# Patient Record
Sex: Female | Born: 1960 | Race: White | Hispanic: No | Marital: Married | Smoking: Former smoker
Health system: Southern US, Community
[De-identification: ages and names within clinical notes are randomized; demographics above are authoritative.]

## PROBLEM LIST (undated history)

## (undated) DIAGNOSIS — Z5189 Encounter for other specified aftercare: Secondary | ICD-10-CM

## (undated) DIAGNOSIS — F329 Major depressive disorder, single episode, unspecified: Secondary | ICD-10-CM

## (undated) DIAGNOSIS — K644 Residual hemorrhoidal skin tags: Secondary | ICD-10-CM

## (undated) DIAGNOSIS — M1990 Unspecified osteoarthritis, unspecified site: Secondary | ICD-10-CM

## (undated) DIAGNOSIS — Z460 Encounter for fitting and adjustment of spectacles and contact lenses: Secondary | ICD-10-CM

## (undated) DIAGNOSIS — IMO0002 Reserved for concepts with insufficient information to code with codable children: Secondary | ICD-10-CM

## (undated) DIAGNOSIS — R55 Syncope and collapse: Secondary | ICD-10-CM

## (undated) DIAGNOSIS — K219 Gastro-esophageal reflux disease without esophagitis: Secondary | ICD-10-CM

## (undated) DIAGNOSIS — G709 Myoneural disorder, unspecified: Secondary | ICD-10-CM

## (undated) DIAGNOSIS — Z972 Presence of dental prosthetic device (complete) (partial): Secondary | ICD-10-CM

## (undated) DIAGNOSIS — F32A Depression, unspecified: Secondary | ICD-10-CM

## (undated) DIAGNOSIS — M797 Fibromyalgia: Secondary | ICD-10-CM

## (undated) DIAGNOSIS — B977 Papillomavirus as the cause of diseases classified elsewhere: Secondary | ICD-10-CM

## (undated) DIAGNOSIS — IMO0001 Reserved for inherently not codable concepts without codable children: Secondary | ICD-10-CM

## (undated) HISTORY — DX: Gastro-esophageal reflux disease without esophagitis: K21.9

## (undated) HISTORY — PX: COLONOSCOPY: SHX174

## (undated) HISTORY — DX: Myoneural disorder, unspecified: G70.9

## (undated) HISTORY — DX: Reserved for concepts with insufficient information to code with codable children: IMO0002

## (undated) HISTORY — PX: CYSTOSCOPY: SUR368

## (undated) HISTORY — DX: Encounter for other specified aftercare: Z51.89

## (undated) HISTORY — DX: Major depressive disorder, single episode, unspecified: F32.9

## (undated) HISTORY — DX: Reserved for inherently not codable concepts without codable children: IMO0001

## (undated) HISTORY — DX: Depression, unspecified: F32.A

## (undated) HISTORY — DX: Papillomavirus as the cause of diseases classified elsewhere: B97.7

## (undated) HISTORY — PX: POLYPECTOMY: SHX149

## (undated) HISTORY — DX: Syncope and collapse: R55

## (undated) HISTORY — DX: Residual hemorrhoidal skin tags: K64.4

## (undated) HISTORY — PX: DIAGNOSTIC LAPAROSCOPY: SUR761

## (undated) HISTORY — DX: Unspecified osteoarthritis, unspecified site: M19.90

---

## 1978-01-05 HISTORY — PX: OTHER SURGICAL HISTORY: SHX169

## 1981-01-05 HISTORY — PX: VAGINAL HYSTERECTOMY: SUR661

## 1981-01-05 HISTORY — PX: APPENDECTOMY: SHX54

## 1982-01-05 HISTORY — PX: ABDOMINAL EXPLORATION SURGERY: SHX538

## 1993-01-05 HISTORY — PX: CARPAL TUNNEL RELEASE: SHX101

## 1997-09-26 ENCOUNTER — Other Ambulatory Visit: Admission: RE | Admit: 1997-09-26 | Discharge: 1997-09-26 | Payer: Self-pay | Admitting: Family Medicine

## 1998-01-05 HISTORY — PX: BREAST ENHANCEMENT SURGERY: SHX7

## 1998-12-12 ENCOUNTER — Other Ambulatory Visit: Admission: RE | Admit: 1998-12-12 | Discharge: 1998-12-12 | Payer: Self-pay | Admitting: Family Medicine

## 1999-08-16 ENCOUNTER — Emergency Department (HOSPITAL_COMMUNITY): Admission: EM | Admit: 1999-08-16 | Discharge: 1999-08-16 | Payer: Self-pay | Admitting: Emergency Medicine

## 1999-12-24 ENCOUNTER — Other Ambulatory Visit: Admission: RE | Admit: 1999-12-24 | Discharge: 1999-12-24 | Payer: Self-pay | Admitting: Family Medicine

## 2001-01-25 ENCOUNTER — Other Ambulatory Visit: Admission: RE | Admit: 2001-01-25 | Discharge: 2001-01-25 | Payer: Self-pay | Admitting: Family Medicine

## 2002-01-23 ENCOUNTER — Other Ambulatory Visit: Admission: RE | Admit: 2002-01-23 | Discharge: 2002-01-23 | Payer: Self-pay | Admitting: Family Medicine

## 2002-05-26 ENCOUNTER — Encounter: Payer: Self-pay | Admitting: Emergency Medicine

## 2002-05-26 ENCOUNTER — Observation Stay (HOSPITAL_COMMUNITY): Admission: EM | Admit: 2002-05-26 | Discharge: 2002-05-26 | Payer: Self-pay | Admitting: Emergency Medicine

## 2003-06-21 ENCOUNTER — Ambulatory Visit (HOSPITAL_COMMUNITY): Admission: RE | Admit: 2003-06-21 | Discharge: 2003-06-21 | Payer: Self-pay | Admitting: Orthopedic Surgery

## 2004-07-01 ENCOUNTER — Other Ambulatory Visit: Admission: RE | Admit: 2004-07-01 | Discharge: 2004-07-01 | Payer: Self-pay | Admitting: Family Medicine

## 2007-04-22 ENCOUNTER — Ambulatory Visit (HOSPITAL_COMMUNITY): Admission: RE | Admit: 2007-04-22 | Discharge: 2007-04-22 | Payer: Self-pay | Admitting: Family Medicine

## 2007-06-28 ENCOUNTER — Ambulatory Visit (HOSPITAL_COMMUNITY): Admission: RE | Admit: 2007-06-28 | Discharge: 2007-06-28 | Payer: Self-pay | Admitting: Family Medicine

## 2008-01-06 HISTORY — PX: BLADDER SUSPENSION: SHX72

## 2008-11-15 ENCOUNTER — Ambulatory Visit (HOSPITAL_COMMUNITY): Admission: RE | Admit: 2008-11-15 | Discharge: 2008-11-16 | Payer: Self-pay | Admitting: Obstetrics

## 2009-05-30 ENCOUNTER — Ambulatory Visit (HOSPITAL_COMMUNITY): Admission: RE | Admit: 2009-05-30 | Discharge: 2009-05-30 | Payer: Self-pay | Admitting: Family Medicine

## 2010-04-09 LAB — CBC
HCT: 33.2 % — ABNORMAL LOW (ref 36.0–46.0)
Hemoglobin: 11.3 g/dL — ABNORMAL LOW (ref 12.0–15.0)
MCV: 93.8 fL (ref 78.0–100.0)
MCV: 93.9 fL (ref 78.0–100.0)
Platelets: 200 10*3/uL (ref 150–400)
Platelets: 271 10*3/uL (ref 150–400)
RBC: 3.53 MIL/uL — ABNORMAL LOW (ref 3.87–5.11)
RBC: 4.28 MIL/uL (ref 3.87–5.11)
WBC: 12.3 10*3/uL — ABNORMAL HIGH (ref 4.0–10.5)
WBC: 7.5 10*3/uL (ref 4.0–10.5)

## 2010-04-09 LAB — BASIC METABOLIC PANEL
Chloride: 103 mEq/L (ref 96–112)
Creatinine, Ser: 0.7 mg/dL (ref 0.4–1.2)
GFR calc Af Amer: >60 mL/min (ref >60)
GFR calc non Af Amer: >60 mL/min (ref >60)

## 2010-04-09 LAB — ABO/RH: ABO/RH(D): O POS

## 2010-04-09 LAB — TYPE AND SCREEN: ABO/RH(D): O POS

## 2010-05-23 NOTE — H&P (Signed)
NAME:  Katie [name], Katie [name]                       ACCOUNT NO.:  [id number]   MEDICAL RECORD NO.:  [id number]                   PATIENT TYPE:  INP   LOCATION:  A202                                 FACILITY:  [facility]   PHYSICIAN:  Hanley Hays. Dechurch, M.D.           DATE OF BIRTH:  12/14/1960   DATE OF ADMISSION:  05/26/2002  DATE OF DISCHARGE:                                HISTORY & PHYSICAL   HISTORY OF PRESENT ILLNESS:  This is a 50 year old, Caucasian female with  past medical history of fibromyalgia who presents with acute onset of left-  sided chest pain radiating to the shoulder and arm which occurred while she  was in the tanning bed.  She has had no previous history of chest pain.  She  has a history of reflux and indigestion, but this was clearly different.  She had no associated shortness of breath, palpitations, diaphoresis or  other changes.  At the insistence of her family, she presented to the  emergency room.  The patient was very active.  She works six days per week  and she exercises regularly.  She has had no similar symptoms in the past.  She has noted some palpitations over the last several months occurring more  frequently over the last month.  She has occasional reflux symptoms for  which she takes an occasional Protonix.  She notes this is caused mostly by  different foods.  She has no history of alcohol or tobacco abuse.  She has  no history of hyperlipidemia.  The patient is followed by [facility].  She is being admitted to the hospital for further  evaluation.   SOCIAL HISTORY:  No alcohol or tobacco use.  She is married with one  daughter who is alive and well age 47.  Works full-time.  Exercises  regularly.   FAMILY HISTORY:  Father died in his 32s with COPD and question of MI.  Several uncles had congestive heart failure, but also had associated  diabetes mellitus.  She has two sisters and a brother all alive and well.  She has one  sister who is obese with hypertension, but no coronary artery  disease per se.   REVIEW OF SYMPTOMS:  MUSCULOSKELETAL:  Fibromyalgia which has been  reasonably well-managed where she remains very functional.  No edema.  GASTROINTESTINAL:  As above.  GENITOURINARY:  Vaginal dryness for which she  takes Estratest.  ENDOCRINE:  Negative.  CONSTITUTIONAL:  No weight changes.   PAST MEDICAL HISTORY:  1. Fibromyalgia.  2. Status post hysterectomy at age 1.  3. Estrogen replacement therapy for years with intact ovaries.  4. Carpal tunnel syndrome, status post repair.  5. Breast augmentation.  6. Hiatal hernia with occasional reflux.  7. Osteopenia/osteoporosis recently diagnosed on bone mineral density study.   MEDICATIONS:  1. Bextra 20 mg daily.  2. Ultram 20 mg q.h.s.  3. Estratest daily.  4.  Protonix p.r.n.  5. Actonel 35 weekly which she has not taken in the last several weeks.  6. Viactiv two daily.   PHYSICAL EXAMINATION:  GENERAL:  Alert, oriented, well-developed, well-  nourished female in no distress.  VITAL SIGNS:  Blood pressure 103/64, pulse 58 and regular, respirations  unlabored.  NECK:  Supple, no JVD, adenopathy, thyromegaly or bruits.  LUNGS:  Clear to auscultation anterior and posterior.  HEART:  Regular rate and rhythm with no murmurs, rubs or gallops.  ABDOMEN:  Flat, soft, nontender.  No hepatosplenomegaly or masses.  Bowel  sounds are positive.  EXTREMITIES:  Without clubbing, cyanosis or edema.  She has normal distal  pulses.  SKIN:  Without rash, lesion or breakdown.   LABORATORY DATA AND X-RAY FINDINGS:  EKG showed normal sinus rhythm,  question of RSR in V1 and V2, no acute ischemic changes.  Chest x-ray is  normal.   Potassium 3.4.  PTT 50.  CK 122, MB 1.3, troponin 0.02.   ASSESSMENT/PLAN:  1. Chest pain at rest in low risk patient.  She will be admitted with     telemetry and rule out.  Cardiology to provide followup.  2. Regular sinus rhythm on  electrocardiogram which is unlikely that the     patient has had a pulmonary embolus.  Will check D-dimer, although this     may be just positional.  Will repeat an electrocardiogram.  The patient     received aspirin and nitroglycerin in the emergency room.  Aspirin will     be continued along with Protonix and her usual medications.  3. Palpitations, more frequently recently.  The patient is on telemetry and     is currently bradycardic.  Hopefully, we can capture this.  Thyroid     stimulating hormone will be obtained, although it was reportedly normal     in January.  Will monitor prolonged prothrombin time raising the question     of antiphospholipid antibody given her history of fibromyalgia.  She has     nothing else to suggest lupus.  Further workup can be deferred to her     primary physician as an outpatient.  4. Fibromyalgia, currently stable and remains functional.  No changes in her     medical regimen.  5. Prolonged estrogen therapy which apparently was instituted for vaginal     dryness.  Certainly, there are some other options.  Could consider     weaning to the lowest dose of estrogen that she tolerates.  This can be     deferred to her primary physicians.  The patient was apprised of the plan     and states a good understanding.                                               Hanley Hays Josefine Class, M.D.    FED/MEDQ  D:  05/26/2002  T:  05/26/2002  Job:  [id number]

## 2010-05-23 NOTE — Discharge Summary (Signed)
   NAME:  Katie [name], Katie [name]                       ACCOUNT NO.:  [id number]   MEDICAL RECORD NO.:  [id number]                   PATIENT TYPE:  INP   LOCATION:  A202                                 FACILITY:  [facility]   PHYSICIAN:  Merrilyn Puma, M.D.       DATE OF BIRTH:  [birth year]   DATE OF ADMISSION:  05/26/2002  DATE OF DISCHARGE:  05/26/2002                                 DISCHARGE SUMMARY   DISCHARGE DIAGNOSIS:  Chest pain, probably not of cardiac origin, probably  musculoskeletal in origin.   DISCHARGE MEDICATIONS:  1. Vioxx 25 mg daily.  2. The patient is to continue with preadmission medications.   FOLLOWUP:  The patient is to followup with primary MD at [facility].   HISTORY:  Ms. Minteer is a 50 year old lady with a history of fibromyalgia who  was admitted to [facility] yesterday when she presented with a  complaint of chest pain radiating to her shoulder.   PHYSICAL EXAMINATION:  VITAL SIGNS:  Revealed a young lady with a BP of  103/64, heart rate of 58.  CHEST:  Clear to auscultation.  CARDIOVASCULAR:  Heart sounds S1 and S2 were normal.  Rhythm is regular.  No  murmurs were heard.  ABDOMEN:  Flat.  Bowel sounds were present.  EXTREMITIES:  Did not reveal any edema.   LABORATORY DATA:  Initial labs CBC, B-MET were all within normal limits.  The patient had normal cardiac enzymes.   She was monitored on telemetry.  Her EKG was also normal without any acute  ST or T-wave changes.   CONSULTATIONS:  The patient was seen in a cardiology consult.  A stress test  was done which did not show any abnormality.   HOSPITAL COURSE:  The patient was seen on rounds this afternoon.  She had no  new complaints.  Her vital signs are stable.  Physical examination is  essentially unchanged.  She will be discharged home today to followup with  her primary MD.   CONDITION ON DISCHARGE:  Stable and satisfactory.                        Merrilyn Puma, M.D.    DSA/MEDQ  D:  05/26/2002  T:  05/26/2002  Job:  [id number]

## 2010-09-26 ENCOUNTER — Encounter: Payer: Self-pay | Admitting: Gastroenterology

## 2010-10-24 ENCOUNTER — Ambulatory Visit: Payer: Self-pay | Admitting: Gastroenterology

## 2010-11-18 ENCOUNTER — Ambulatory Visit (INDEPENDENT_AMBULATORY_CARE_PROVIDER_SITE_OTHER): Payer: Commercial Managed Care - PPO | Admitting: Gastroenterology

## 2010-11-18 ENCOUNTER — Encounter: Payer: Self-pay | Admitting: Gastroenterology

## 2010-11-18 VITALS — BP 104/62 | HR 72 | Ht 60.0 in | Wt 109.0 lb

## 2010-11-18 DIAGNOSIS — K5900 Constipation, unspecified: Secondary | ICD-10-CM

## 2010-11-18 DIAGNOSIS — Z1211 Encounter for screening for malignant neoplasm of colon: Secondary | ICD-10-CM

## 2010-11-18 MED ORDER — PEG-KCL-NACL-NASULF-NA ASC-C 100 G PO SOLR: 1.0000 | Freq: Once | ORAL | Status: DC

## 2010-11-18 NOTE — Patient Instructions (Signed)
You have been scheduled for a Colonoscopy. See separate instructions. Pick up your prep kit from your pharmacy.  Use Miralax mixing 17 grams in 8 oz of water as needed for constipation. cc: Karleen Hampshire, MD        Noland Fordyce, MD

## 2010-11-18 NOTE — Progress Notes (Signed)
History of Present Illness: This is a 50 year old female with a many year history of intermittent mild constipation associated with mild lower abdominal pain and bloating. She states she was diagnosed with irritable bowel syndrome years ago. She uses MiraLax intermittently which alleviates her symptoms. Denies weight loss, abdominal pain, constipation, diarrhea, change in stool caliber, melena, hematochezia, nausea, vomiting, dysphagia, reflux symptoms, chest pain.  Past Medical History  Diagnosis Date  . Cystocele   . Depression   . External hemorrhoids   . HPV (human papilloma virus) infection    Past Surgical History  Procedure Date  . Cystoscopy   . Breast enhancement surgery   . Vaginal hysterectomy 1983  . Carpal tunnel release   . Tubaligation 1980  . Appendectomy 1983    reports that she has quit smoking. She has never used smokeless tobacco. She reports that she does not drink alcohol or use illicit drugs. family history includes Bone cancer in her daughter and Diabetes in an unspecified family member. Allergies  Allergen Reactions  . Celebrex (Celecoxib)    Outpatient Encounter Prescriptions as of 11/18/2010  Medication Sig Dispense Refill  . Cholecalciferol (D3 ADULT) 1000 UNITS CHEW Chew by mouth.        . estrogen-methylTESTOSTERone (ESTRATEST HS) 0.625-1.25 MG per tablet Take 1 tablet by mouth daily.        . fish oil-omega-3 fatty acids 1000 MG capsule Take 1 g by mouth daily.        [name] ibandronate (BONIVA) 150 MG tablet Take 150 mg by mouth every 30 (thirty) days. Take in the morning with a full glass of water, on an empty stomach, and do not take anything else by mouth or lie down for the next 30 min.       . Multiple Vitamin (MULTIVITAMIN) tablet Take 1 tablet by mouth daily.        . peg 3350 powder (MOVIPREP) 100 G SOLR Take 1 kit (100 g total) by mouth once.  1 kit  0    Review of Systems: Pertinent positive and negative review of systems were noted in the above  HPI section. All other review of systems were otherwise negative.  Physical Exam: General: Well developed , well nourished, pleasant, no acute distress Head: Normocephalic and atraumatic Eyes:  sclerae anicteric, EOMI Ears: Normal auditory acuity Mouth: No deformity or lesions Neck: Supple, no masses or thyromegaly Lungs: Clear throughout to auscultation Heart: Regular rate and rhythm; no murmurs, rubs or bruits Abdomen: Soft, non tender and non distended. No masses, hepatosplenomegaly or hernias noted. Normal Bowel sounds Rectal: Deferred to colonosocpy Musculoskeletal: Symmetrical with no gross deformities  Skin: No lesions on visible extremities Pulses:  Normal pulses noted Extremities: No clubbing, cyanosis, edema or deformities noted Neurological: Alert oriented x 4, grossly nonfocal Cervical Nodes:  No significant cervical adenopathy Inguinal Nodes: No significant inguinal adenopathy Psychological:  Alert and cooperative. Normal mood and affect  Assessment and Recommendations:  1. Colorectal cancer screening. Average risk. The risks, benefits, and alternatives to colonoscopy with possible biopsy and possible polypectomy were discussed with the patient and they consent to proceed.   2. Constipation, mild associated with lower abdominal discomfort and bloating. Maintain a high fiber diet with adequate daily water intake. MiraLax daily as needed.

## 2010-12-01 ENCOUNTER — Encounter: Payer: Self-pay | Admitting: Gastroenterology

## 2010-12-01 ENCOUNTER — Ambulatory Visit (AMBULATORY_SURGERY_CENTER): Payer: Commercial Managed Care - PPO | Admitting: Gastroenterology

## 2010-12-01 VITALS — BP 107/66 | HR 73 | Temp 97.7°F | Resp 20 | Ht 60.0 in | Wt 109.0 lb

## 2010-12-01 DIAGNOSIS — D126 Benign neoplasm of colon, unspecified: Secondary | ICD-10-CM

## 2010-12-01 DIAGNOSIS — Z1211 Encounter for screening for malignant neoplasm of colon: Secondary | ICD-10-CM

## 2010-12-01 MED ORDER — SODIUM CHLORIDE 0.9 % IV SOLN: 500.0000 mL | INTRAVENOUS | Status: DC

## 2010-12-01 NOTE — Progress Notes (Signed)
Patient did not experience any of the following events: a burn prior to discharge; a fall within the facility; wrong site/side/patient/procedure/implant event; or a hospital transfer or hospital admission upon discharge from the facility. (G8907) Patient did not have preoperative order for IV antibiotic SSI prophylaxis. (G8918)  

## 2010-12-01 NOTE — Patient Instructions (Addendum)
Your polyp results will be mailed to you within two weeks.  Try to cut back on your laxative use.  There are suggestions on your discharge papers as to what to use instead.   You may resume your routine medications today.   If you have any questions or comments, please call us at [phone]. Thank-you.

## 2010-12-02 ENCOUNTER — Telehealth: Payer: Self-pay | Admitting: *Deleted

## 2010-12-02 NOTE — Telephone Encounter (Signed)
Left message on number left in admitting yesterday that pt gave ok to leave message on. ewm

## 2010-12-07 ENCOUNTER — Encounter: Payer: Self-pay | Admitting: Gastroenterology

## 2011-10-08 ENCOUNTER — Encounter (HOSPITAL_COMMUNITY): Payer: Self-pay | Admitting: Pharmacy Technician

## 2011-10-08 ENCOUNTER — Other Ambulatory Visit: Payer: Self-pay | Admitting: Ophthalmology

## 2011-10-08 NOTE — H&P (Signed)
History & Physical:   DATE:     NAME:  Katie [name], Katie [name]     [id number]       HISTORY OF PRESENT ILLNESS: Referred by [facility]  Trab/Tube OD     Chief Eye Complaints  Glaucoma Elevated IOP Having some pain feels as if their is something over the right eye. Upon awakening Seeing light but everything else is dark around,as the day goes on it does improve some.Started about 2 weeks ago.Patient states at time when at home she will wear a patch over Od.     IOP @[facility]  09/22/11 OD:32,34 OS:13  HPI: EYES: Reports symptoms of     LOCATION:      QUALITY/COURSE:   Reports condition is worsening.        INTENSITY/SEVERITY:    Reports measurement ( or degree) as   DURATION:   Reports the general length of symptoms to be   ONSET/TIMING:   Reports occurrence as 2 weeks ago.      CONTEXT/WHEN:   Reports usually associated with  headache   MODIFIERS/TREATMENTS:  Improved by    eye patch          ROS:   GEN- Constitutional: HENT: GEN - Endocrine: Reports symptoms of LUNGS/Respiratory:  HEART/Cardiovascular: Reports symptoms of ABD/Gastrointestinal:   Musculoskeletal (BJE): +++++      arthralgias   NEURO/Neurological: PSYCH/Psychiatric:    Is the pt oriented to time, place, person? 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  SOCIAL HISTORY: Starter Pick List  - Social History    Scientist, research (physical sciences)  FAMILY HISTORY: Positive family history for  -   Diabetes - Type 2:   Negative family history for  -   Glaucoma: PARENTS: CHILDREN: GRANDPARENTS: Diabetes - Type 2:   SIBLINGS: UNCLES/AUNTS: Diabetes - Type 2:   OTHERS/DISTANT:  ALLERGIES: CODEINE:  PHYSICAL EXAMINATION: [place]     OD: [place] 20/70 PH: 20/50 OS [place] 20/30 PH: 20/25  EYEGLASSES:Rx Reading glasses Only  OD: Left at Work Over 10 yrs old                                             OS: ADD:  MR   OD: OS: ADD:  VF:   OD: OS:  PUPILS:  EYELIDS & OCULAR ADNEXA:  SLE: Conjunctiva:+2 injection OD, and quiet OS  Cornea:  decreased tear film each eye  AC:  plus one pigmented cell OD deep and quiet OS  Iris: blue with iris transillumination defects each eye  Lens: plus one nuclear sclerosis OD, clear left eye  Vitreous:  CCT  Ta   in mmHg     OD:  29     20 after simbrinza   OS 12 Time: 11:10 AM  Gonio:  OD  Eye iris insertion high angle open 360 to scleral spur with +2-3 pig  OS angle open 360 to scleral spur  Dilation:  phenylephrine 2.5% Ou   Fundus:  optic nerve  OD      90% cupped pale with inferior and superior rim loss                                             OS Pink nerve 45% cupping   Macula       OD:  Normal                  OS: Normal  Vessels:normal  Periphery:normal H&P  B/P: 110/62 Pulse:66 Resp:20      Exam: GENERAL: Appearance: General appearance can be described as well-nourished, well-developed, and in no acute distress.    LYMPHATIC: HEAD, EARS, NOSE AND THROAT: Ears-Nose (external) Inspection: Externally, nose and ears are normal in appearance and without scars, lesions, or nodules.      Otoscopic Exam: External auditory canals and tympanic membranes are normal.      Hearing assessment shows no problems with normal conversation.    Nose exam, internally, reveals nasal mucosa, septum and turbinates are  unremarkable.    Teeth, Gingiva, and Lip Exams: No lesions or evidence of infection.      Oropharynx demonstrates oral mucosa, salivary glands, tongue, tonsils, posterior pharynx, hard-soft palates are normal.  EYES: see above  NECK: Neck tissue exam demonstrates no masses, symmetrical, and trachea is midline.      LUNGS and RESPIRATORY: Lung auscultation elicits no wheezing, rhonci, rales or rubs and with equal breath sounds.    Respiratory effort described as breathing is unlabored and chest movement is symmetrical.    HEART (Cardiovascular): Heart auscultation discovers regular rate and rhythm; no murmur, gallop or rub. Normal heart sounds.    ABDOMEN (Gastrointestinal): Mass/Tenderness Exam: Neither are present.     Liver/Spleen: No hepatomegaly or splenomegaly.   MUSCULOSKELETAL (BJE): Inspection-Palpation: No major bone, joint, tendon, or muscle changes.      NEUROLOGICAL: Alert and oriented. No major deficits of coordination or sensation.      PSYCHIATRIC: Insight and judgment appear  both to be intact and appropriate.    Mood and affect are described as normal mood and full affect.    SKIN: Skin Inspection: No rashes or lesions.  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  Actions:  Plans for New Diagnosis:  Lab/Tests:  per anesthesia X-rays:  Studies (other than lab or x-ray):  Old Records Requested:  Record Release obtained, and all previous records are requested.    ___________________________ Chalmers Guest, Montez Hageman Starter - Inactive Problems:    Rheumatoids Arthritis (neck)

## 2011-10-12 ENCOUNTER — Encounter (HOSPITAL_COMMUNITY): Payer: Self-pay

## 2011-10-12 ENCOUNTER — Encounter (HOSPITAL_COMMUNITY)
Admission: RE | Admit: 2011-10-12 | Discharge: 2011-10-12 | Disposition: A | Discharge status: Home / Self Care | Payer: Commercial Managed Care - PPO | Source: Ambulatory Visit | Attending: Ophthalmology | Admitting: Ophthalmology

## 2011-10-12 HISTORY — DX: Fibromyalgia: M79.7

## 2011-10-12 LAB — BASIC METABOLIC PANEL
BUN: 10 mg/dL (ref 6–23)
CO2: 29 mEq/L (ref 19–32)
Chloride: 104 mEq/L (ref 96–112)
Creatinine, Ser: 0.74 mg/dL (ref 0.50–1.10)

## 2011-10-12 LAB — CBC
HCT: 37 % (ref 36.0–46.0)
MCV: 89.4 fL (ref 78.0–100.0)
RBC: 4.14 MIL/uL (ref 3.87–5.11)
WBC: 5.1 10*3/uL (ref 4.0–10.5)

## 2011-10-12 NOTE — Progress Notes (Signed)
No orders at time of pat.  Office notified

## 2011-10-12 NOTE — Pre-Procedure Instructions (Signed)
20 CABRIA MICALIZZI  10/12/2011   Your procedure is scheduled on:  10/14/11  Report to [facility] at 1:00 AM.  Call this number if you have problems the morning of surgery: [phone]   Remember:   Do not eat food:After Midnight.    Take these medicines the morning of surgery with A SIP OF WATER: cymbalta,estragen   Do not wear jewelry, make-up or nail polish.  Do not wear lotions, powders, or perfumes. You may wear deodorant.  Do not shave 48 hours prior to surgery. Men may shave face and neck.  Do not bring valuables to the hospital.  Contacts, dentures or bridgework may not be worn into surgery.  Leave suitcase in the car. After surgery it may be brought to your room.  For patients admitted to the hospital, checkout time is 11:00 AM the day of discharge.   Patients discharged the day of surgery will not be allowed to drive home.  Name and phone number of your driver: family  Special Instructions: Shower using CHG 2 nights before surgery and the night before surgery.  If you shower the day of surgery use CHG.  Use special wash - you have one bottle of CHG for all showers.  You should use approximately 1/3 of the bottle for each shower.   Please read over the following fact sheets that you were given: Pain Booklet, Coughing and Deep Breathing, MRSA Information and Surgical Site Infection Prevention

## 2011-10-14 ENCOUNTER — Encounter (HOSPITAL_COMMUNITY): Payer: Self-pay | Admitting: Anesthesiology

## 2011-10-14 ENCOUNTER — Encounter (HOSPITAL_COMMUNITY): Payer: Self-pay | Admitting: *Deleted

## 2011-10-14 ENCOUNTER — Ambulatory Visit (HOSPITAL_COMMUNITY)
Admission: RE | Admit: 2011-10-14 | Discharge: 2011-10-14 | Disposition: A | Discharge status: Home / Self Care | Payer: Commercial Managed Care - PPO | Source: Ambulatory Visit | Attending: Ophthalmology | Admitting: Ophthalmology

## 2011-10-14 ENCOUNTER — Encounter (HOSPITAL_COMMUNITY)
Admission: RE | Disposition: A | Discharge status: Home / Self Care | Payer: Self-pay | Source: Ambulatory Visit | Attending: Ophthalmology

## 2011-10-14 ENCOUNTER — Ambulatory Visit (HOSPITAL_COMMUNITY): Payer: Commercial Managed Care - PPO | Admitting: Anesthesiology

## 2011-10-14 ENCOUNTER — Other Ambulatory Visit: Payer: Self-pay | Admitting: Ophthalmology

## 2011-10-14 DIAGNOSIS — H401490 Capsular glaucoma with pseudoexfoliation of lens, unspecified eye, stage unspecified: Secondary | ICD-10-CM | POA: Insufficient documentation

## 2011-10-14 HISTORY — PX: MINI SHUNT INSERTION: SHX5337

## 2011-10-14 SURGERY — INSERTION OF MINI SHUNT
Anesthesia: General | Site: Eye | Laterality: Right | Wound class: Clean Contaminated

## 2011-10-14 SURGERY — TRABECULECTOMY
Anesthesia: General | Laterality: Right

## 2011-10-14 MED ORDER — ONDANSETRON HCL 4 MG/2ML IJ SOLN
INTRAMUSCULAR | Status: DC | PRN
  Administered 2011-10-14 (×2): 4 mg via INTRAVENOUS

## 2011-10-14 MED ORDER — PHENYLEPHRINE HCL 10 MG/ML IJ SOLN
INTRAMUSCULAR | Status: DC | PRN
  Administered 2011-10-14: 40 ug via INTRAVENOUS

## 2011-10-14 MED ORDER — LIDOCAINE HCL (CARDIAC) 20 MG/ML IV SOLN
INTRAVENOUS | Status: DC | PRN
  Administered 2011-10-14: 30 mg via INTRAVENOUS

## 2011-10-14 MED ORDER — NEOSTIGMINE METHYLSULFATE 1 MG/ML IJ SOLN
INTRAMUSCULAR | Status: DC | PRN
  Administered 2011-10-14: 2 mg via INTRAVENOUS

## 2011-10-14 MED ORDER — TOBRAMYCIN 0.3 % OP OINT
TOPICAL_OINTMENT | OPHTHALMIC | Status: DC | PRN
  Administered 2011-10-14: 1 via OPHTHALMIC

## 2011-10-14 MED ORDER — FLUORESCEIN SODIUM 1 MG OP STRP
ORAL_STRIP | OPHTHALMIC | Status: AC
  Filled 2011-10-14: qty 1

## 2011-10-14 MED ORDER — HYALURONIDASE HUMAN 150 UNIT/ML IJ SOLN
INTRAMUSCULAR | Status: AC
  Filled 2011-10-14: qty 1

## 2011-10-14 MED ORDER — SODIUM CHLORIDE 0.9 % IV SOLN
INTRAVENOUS | Status: DC
  Administered 2011-10-14: 16:00:00 via INTRAVENOUS

## 2011-10-14 MED ORDER — DROPERIDOL 2.5 MG/ML IJ SOLN
0.6250 mg | Freq: Once | INTRAMUSCULAR | Status: AC
  Administered 2011-10-14: 0.625 mg via INTRAVENOUS
  Filled 2011-10-14: qty 0.25

## 2011-10-14 MED ORDER — ATROPINE SULFATE 1 % OP SOLN
OPHTHALMIC | Status: AC
  Filled 2011-10-14: qty 2

## 2011-10-14 MED ORDER — EPHEDRINE SULFATE 50 MG/ML IJ SOLN
INTRAMUSCULAR | Status: DC | PRN
  Administered 2011-10-14: 5 mg via INTRAVENOUS
  Administered 2011-10-14 (×2): 10 mg via INTRAVENOUS
  Administered 2011-10-14: 5 mg via INTRAVENOUS
  Administered 2011-10-14 (×2): 10 mg via INTRAVENOUS

## 2011-10-14 MED ORDER — ROCURONIUM BROMIDE 100 MG/10ML IV SOLN
INTRAVENOUS | Status: DC | PRN
  Administered 2011-10-14: 20 mg via INTRAVENOUS

## 2011-10-14 MED ORDER — SODIUM HYALURONATE 10 MG/ML IO SOLN
INTRAOCULAR | Status: DC | PRN
  Administered 2011-10-14: 0.85 mL via INTRAOCULAR

## 2011-10-14 MED ORDER — GATIFLOXACIN 0.5 % OP SOLN
1.0000 [drp] | OPHTHALMIC | Status: DC
  Administered 2011-10-14: 1 [drp] via OPHTHALMIC
  Filled 2011-10-14: qty 2.5

## 2011-10-14 MED ORDER — EPINEPHRINE HCL 1 MG/ML IJ SOLN
INTRAMUSCULAR | Status: AC
  Filled 2011-10-14: qty 1

## 2011-10-14 MED ORDER — TRIAMCINOLONE ACETONIDE 40 MG/ML IJ SUSP
INTRAMUSCULAR | Status: AC
  Filled 2011-10-14: qty 1

## 2011-10-14 MED ORDER — SODIUM CHLORIDE 0.9 % IV SOLN
INTRAVENOUS | Status: DC | PRN
  Administered 2011-10-14 (×3): via INTRAVENOUS

## 2011-10-14 MED ORDER — BSS IO SOLN
INTRAOCULAR | Status: AC
  Filled 2011-10-14: qty 15

## 2011-10-14 MED ORDER — FENTANYL CITRATE 0.05 MG/ML IJ SOLN
INTRAMUSCULAR | Status: DC | PRN
  Administered 2011-10-14: 100 ug via INTRAVENOUS

## 2011-10-14 MED ORDER — LIDOCAINE HCL 4 % MT SOLN
OROMUCOSAL | Status: DC | PRN
  Administered 2011-10-14: 4 mL via TOPICAL

## 2011-10-14 MED ORDER — MITOMYCIN-C INJECTION USE IN OR ONLY (0.4 MG/ML)
INTRAVENOUS | Status: DC | PRN
  Administered 2011-10-14: 0.5 mL via OPHTHALMIC

## 2011-10-14 MED ORDER — BSS IO SOLN
INTRAOCULAR | Status: DC | PRN
  Administered 2011-10-14: 500 mL via INTRAOCULAR

## 2011-10-14 MED ORDER — TETRACAINE HCL 0.5 % OP SOLN
OPHTHALMIC | Status: AC
  Filled 2011-10-14: qty 2

## 2011-10-14 MED ORDER — TETRACAINE HCL 0.5 % OP SOLN
1.0000 [drp] | OPHTHALMIC | Status: DC
  Filled 2011-10-14: qty 2

## 2011-10-14 MED ORDER — GLYCOPYRROLATE 0.2 MG/ML IJ SOLN
INTRAMUSCULAR | Status: DC | PRN
  Administered 2011-10-14: 0.2 mg via INTRAVENOUS

## 2011-10-14 MED ORDER — TRIAMCINOLONE ACETONIDE 40 MG/ML IJ SUSP
INTRAMUSCULAR | Status: DC | PRN
  Administered 2011-10-14: .1 mL via INTRAMUSCULAR

## 2011-10-14 MED ORDER — MITOMYCIN-C INJECTION USE IN OR ONLY (0.4 MG/ML)
0.5000 mL | Freq: Once | INTRAVENOUS | Status: DC
  Filled 2011-10-14: qty 0.5

## 2011-10-14 MED ORDER — FLUORESCEIN SODIUM 1 MG OP STRP
ORAL_STRIP | OPHTHALMIC | Status: DC | PRN
  Administered 2011-10-14: 1 via OPHTHALMIC

## 2011-10-14 MED ORDER — HYDROMORPHONE HCL PF 1 MG/ML IJ SOLN: 0.2500 mg | INTRAMUSCULAR | Status: DC | PRN

## 2011-10-14 MED ORDER — BSS IO SOLN
INTRAOCULAR | Status: DC | PRN
  Administered 2011-10-14: 15 mL via INTRAOCULAR

## 2011-10-14 MED ORDER — MIDAZOLAM HCL 5 MG/5ML IJ SOLN
INTRAMUSCULAR | Status: DC | PRN
  Administered 2011-10-14: 1 mg via INTRAVENOUS

## 2011-10-14 MED ORDER — HEMOSTATIC AGENTS (NO CHARGE) OPTIME
TOPICAL | Status: DC | PRN
  Administered 2011-10-14: 1 via TOPICAL

## 2011-10-14 MED ORDER — ONDANSETRON HCL 4 MG/2ML IJ SOLN: 4.0000 mg | Freq: Once | INTRAMUSCULAR | Status: DC | PRN

## 2011-10-14 MED ORDER — BUPIVACAINE HCL 0.75 % IJ SOLN
INTRAMUSCULAR | Status: AC
  Filled 2011-10-14: qty 10

## 2011-10-14 MED ORDER — BSS IO SOLN
INTRAOCULAR | Status: AC
  Filled 2011-10-14: qty 500

## 2011-10-14 MED ORDER — TOBRAMYCIN-DEXAMETHASONE 0.3-0.1 % OP OINT
TOPICAL_OINTMENT | OPHTHALMIC | Status: AC
  Filled 2011-10-14: qty 3.5

## 2011-10-14 MED ORDER — ACETYLCHOLINE CHLORIDE 1:100 IO SOLR
INTRAOCULAR | Status: AC
  Filled 2011-10-14: qty 1

## 2011-10-14 MED ORDER — PREDNISOLONE ACETATE 1 % OP SUSP
1.0000 [drp] | OPHTHALMIC | Status: AC
  Administered 2011-10-14: 1 [drp] via OPHTHALMIC
  Filled 2011-10-14: qty 5

## 2011-10-14 MED ORDER — PROPOFOL 10 MG/ML IV BOLUS
INTRAVENOUS | Status: DC | PRN
  Administered 2011-10-14: 200 mg via INTRAVENOUS

## 2011-10-14 SURGICAL SUPPLY — 49 items
APL SRG 3 HI ABS STRL LF PLS (MISCELLANEOUS) ×1
APPLICATOR COTTON TIP 6IN STRL (MISCELLANEOUS) IMPLANT
APPLICATOR DR MATTHEWS STRL (MISCELLANEOUS) ×2 IMPLANT
BLADE EYE CATARACT 19 1.4 BEAV (BLADE) IMPLANT
BLADE STAB KNIFE 45DEG (BLADE) IMPLANT
BLADE SURG 15 STRL LF DISP TIS (BLADE) IMPLANT
BLADE SURG 15 STRL SS (BLADE)
CANISTER SUCTION 2500CC (MISCELLANEOUS) IMPLANT
CLOTH BEACON ORANGE TIMEOUT ST (SAFETY) ×2 IMPLANT
CORDS BIPOLAR (ELECTRODE) ×2 IMPLANT
DRAPE OPHTHALMIC 40X48 W POUCH (DRAPES) ×2 IMPLANT
DRAPE RETRACTOR (MISCELLANEOUS) ×2 IMPLANT
ERASER HMR WETFIELD 23G BP (MISCELLANEOUS) IMPLANT
GLOVE BIO SURGEON STRL SZ8 (GLOVE) ×2 IMPLANT
GLOVE BIO SURGEON STRL SZ8.5 (GLOVE) ×2 IMPLANT
GLOVE ECLIPSE 7.0 STRL STRAW (GLOVE) ×2 IMPLANT
GOWN PREVENTION PLUS XLARGE (GOWN DISPOSABLE) ×1 IMPLANT
GOWN STRL NON-REIN LRG LVL3 (GOWN DISPOSABLE) ×5 IMPLANT
KIT ROOM TURNOVER OR (KITS) ×2 IMPLANT
KNIFE GRIESHABER SHARP 2.5MM (MISCELLANEOUS) ×2 IMPLANT
MARKER SKIN DUAL TIP RULER LAB (MISCELLANEOUS) ×2 IMPLANT
MASK EYE SHIELD (GAUZE/BANDAGES/DRESSINGS) ×2 IMPLANT
NDL 25GX 5/8IN NON SAFETY (NEEDLE) IMPLANT
NDL HYPO 30X.5 LL (NEEDLE) ×1 IMPLANT
NEEDLE 22X1 1/2 (OR ONLY) (NEEDLE) ×2 IMPLANT
NEEDLE 25GX 5/8IN NON SAFETY (NEEDLE) IMPLANT
NEEDLE HYPO 23GX1 LL BLUE HUB (NEEDLE) IMPLANT
NEEDLE HYPO 30X.5 LL (NEEDLE) ×2 IMPLANT
NS IRRIG 1000ML POUR BTL (IV SOLUTION) ×2 IMPLANT
PACK CATARACT CUSTOM (CUSTOM PROCEDURE TRAY) ×2 IMPLANT
PAD ARMBOARD 7.5X6 YLW CONV (MISCELLANEOUS) ×4 IMPLANT
PAD EYE OVAL STERILE LF (GAUZE/BANDAGES/DRESSINGS) ×1 IMPLANT
SHUNT EXPRS GLAUCOMA MINI P200 (Intraocular Lens) ×1 IMPLANT
SPEAR EYE SURG WECK-CEL (MISCELLANEOUS) IMPLANT
SPECIMEN JAR SMALL (MISCELLANEOUS) IMPLANT
SPONGE SURGIFOAM ABS GEL 12-7 (HEMOSTASIS) ×1 IMPLANT
SUT ETHILON 10 0 CS140 6 (SUTURE) IMPLANT
SUT ETHILON 9 0 BV100 4 (SUTURE) IMPLANT
SUT ETHILON 9 0 TG140 8 (SUTURE) ×1 IMPLANT
SUT MERSILENE 6 0 S14 DA (SUTURE) ×1 IMPLANT
SUT SILK 6 0 G 6 (SUTURE) ×2 IMPLANT
SUT VICRYL 8 0 TG140 8 (SUTURE) IMPLANT
SUT VICRYL 9-0 (SUTURE) IMPLANT
SYR 50ML SLIP (SYRINGE) ×2 IMPLANT
SYR TB 1ML LUER SLIP (SYRINGE) IMPLANT
TOWEL OR 17X24 6PK STRL BLUE (TOWEL DISPOSABLE) ×4 IMPLANT
TUBE CONNECTING 12X1/4 (SUCTIONS) IMPLANT
WATER STERILE IRR 1000ML POUR (IV SOLUTION) ×2 IMPLANT
WIPE INSTRUMENT VISIWIPE 73X73 (MISCELLANEOUS) ×2 IMPLANT

## 2011-10-14 NOTE — Transfer of Care (Signed)
Immediate Anesthesia Transfer of Care Note  Patient: Katie [name]  Procedure(s) Performed: Procedure(s) (LRB) with comments: INSERTION OF MINI SHUNT (Right)  Patient Location: PACU  Anesthesia Type: General  Level of Consciousness: oriented, patient cooperative, lethargic and responds to stimulation  Airway & Oxygen Therapy: Patient Spontanous Breathing and Patient connected to nasal cannula oxygen  Post-op Assessment: Report given to PACU RN  Post vital signs: Reviewed and stable  Complications: No apparent anesthesia complications

## 2011-10-14 NOTE — Anesthesia Procedure Notes (Signed)
Performed by: Constantina Laseter A       

## 2011-10-14 NOTE — Anesthesia Postprocedure Evaluation (Signed)
  Anesthesia Post-op Note  Patient: Katie [name]  Procedure(s) Performed: Procedure(s) (LRB) with comments: INSERTION OF MINI SHUNT (Right)  Patient Location: PACU  Anesthesia Type: General  Level of Consciousness: awake, alert  and oriented  Airway and Oxygen Therapy: Patient Spontanous Breathing and Patient connected to nasal cannula oxygen  Post-op Pain: none  Post-op Assessment: Post-op Vital signs reviewed and Patient's Cardiovascular Status Stable  Post-op Vital Signs: stable  Complications: No apparent anesthesia complications

## 2011-10-14 NOTE — Op Note (Signed)
Preoperative diagnosis: Silicone to glaucoma right eye Postoperative diagnosis: Same Procedure glaucoma express mini tube with mitomycin-C right eye Anesthesia: Gen. Complications: Conjunctival button buttonhole Assistant: Mindy Procedure: The patient was taken to the operating room where she had induction of general anesthesia. Following this the patient's face was prepped and draped in the usual sterile fashion. With the surgeon sitting superiorly and the operating microscope in position a 6-0 nylon suture was passed through clear cornea to infraducted the eye a Hoskins forceps was used to grasp the conjunctiva an incision was made at the superior nasal limbus with a shot Wescott scissors then the bleb blunt Wescott scissors were used to dissect posteriorly 10 ounce fibers were recessed with to blade with a Tooke blade using a Weck-Cel sponge to recess the conjunctiva bleeding was controlled by cautery a 45 blade was used to fashion a half thickness scleral flap with the base at the limbus of 4 mm following this I call for mitomycin-C however had not been prepared therefore paracentesis tract was formed at the 5:00 position and Provisc was injected in the eye following this the scleral flap was was elevated using a [place] forceps and a 5700 blade dissecting to the corneal scleral limbus a preplaced 10-0 nylon sutures were placed while waiting for mitomycin-C and mitomycin-C arrived it was 0.4 mg per cc was placed on a Gelfoam sponge and allowed to stay under the conjunctiva for 2 minutes and then irrigated with 40 cc of balanced salt solution following this a 25-gauge needle on Provisc was passed under the scleral flap to into the anterior chamber the express mini tube was then inspected and noted to have no defects the tube was a P2 100. SN #[id number] the tube was passed through the opening under the scleral flap and rotated into good position the scleral flap was then sutured with 4 interrupted  10-0 nylon sutures the conjunctiva was then closed with a 9-0 Vicryl on a BV 100 needle at the using running suture at the limbus it was noted at the end of the closure that there was a conjunctival buttonhole adjacent to the limbal sutures an additional 9-0 Vicryl suture on a BV 100 needle was used in a mattress fashion to close the opening BSS was injected the chamber deep and the bleb elevated at this point it was Seidel negative following this a subconjunctival injection of Kenalog 4 mg was given in the inferior temporal conjunctiva topical TobraDex was applied to the eye a patch and Fox U. were placed and the patient returned to recovery area in stable condition.

## 2011-10-14 NOTE — Preoperative (Signed)
Beta Blockers   Reason not to administer Beta Blockers:Not Applicable 

## 2011-10-14 NOTE — Interval H&P Note (Signed)
History and Physical Interval Note:  10/14/2011 4:28 PM  Katie [name]  has presented today for surgery, with the diagnosis of PSEUDOEXFOLIATION GLAUCOMA, FIBROMYALGIA  The various methods of treatment have been discussed with the patient and family. After consideration of risks, benefits and other options for treatment, the patient has consented to  Procedure(s) (LRB) with comments: INSERTION OF MINI SHUNT (Right) as a surgical intervention .  The patient's history has been reviewed, patient examined, no change in status, stable for surgery.  I have reviewed the patient's chart and labs.  Questions were answered to the patient's satisfaction.     [name]

## 2011-10-14 NOTE — H&P (View-Only) (Signed)
                  History & Physical:   DATE:     NAME:  [name], [name].     [id number]       HISTORY OF PRESENT ILLNESS: Referred by [facility]  Trab/Tube OD     Chief Eye Complaints  Glaucoma Elevated IOP Having some pain feels as if their is something over the right eye. Upon awakening Seeing light but everything else is dark around,as the day goes on it does improve some.Started about 2 weeks ago.Patient states at time when at home she will wear a patch over Od.     IOP @[facility] 09/22/11 OD:32,34 OS:13  HPI: EYES: Reports symptoms of     LOCATION:      QUALITY/COURSE:   Reports condition is worsening.        INTENSITY/SEVERITY:    Reports measurement ( or degree) as   DURATION:   Reports the general length of symptoms to be   ONSET/TIMING:   Reports occurrence as 2 weeks ago.      CONTEXT/WHEN:   Reports usually associated with  headache   MODIFIERS/TREATMENTS:  Improved by    eye patch          ROS:   GEN- Constitutional: HENT: GEN - Endocrine: Reports symptoms of LUNGS/Respiratory:  HEART/Cardiovascular: Reports symptoms of ABD/Gastrointestinal:   Musculoskeletal (BJE): +++++      arthralgias   NEURO/Neurological: PSYCH/Psychiatric:    Is the pt oriented to time, place, person? 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  SOCIAL HISTORY: Starter Pick List  - Social History    [employer]visor  FAMILY HISTORY: Positive family history for  -   Diabetes - Type 2:   Negative family history for  -   Glaucoma: PARENTS: CHILDREN: GRANDPARENTS: Diabetes - Type 2:   SIBLINGS: UNCLES/AUNTS: Diabetes - Type 2:   OTHERS/DISTANT:  ALLERGIES: CODEINE:  PHYSICAL EXAMINATION: [place]     OD: [place] 20/70 PH: 20/50 OS [place] 20/30 PH: 20/25  EYEGLASSES:Rx Reading glasses Only  OD: Left at Work Over 10 yrs old                                             OS: ADD:  MR   OD: OS: ADD:  VF:   OD: OS:  PUPILS:  EYELIDS & OCULAR ADNEXA:  SLE: Conjunctiva:+2 injection OD, and quiet OS  Cornea:  decreased tear film each eye  AC:  plus one pigmented cell OD deep and quiet OS  Iris: blue with iris transillumination defects each eye  Lens: plus one nuclear sclerosis OD, clear left eye    Vitreous:  CCT  Ta   in mmHg     OD:  29     20 after simbrinza   OS 12 Time: 11:10 AM  Gonio:  OD  Eye iris insertion high angle open 360 to scleral spur with +2-3 pig  OS angle open 360 to scleral spur  Dilation:  phenylephrine 2.5% Ou   Fundus:  optic nerve  OD      90% cupped pale with inferior and superior rim loss                                             OS Pink nerve 45% cupping   Macula       OD:  Normal                  OS: Normal  Vessels:normal  Periphery:normal H&P  B/P: 110/62 Pulse:66 Resp:20      Exam: GENERAL: Appearance: General appearance can be described as well-nourished, well-developed, and in no acute distress.    LYMPHATIC: HEAD, EARS, NOSE AND THROAT: Ears-Nose (external) Inspection: Externally, nose and ears are normal in appearance and without scars, lesions, or nodules.      Otoscopic Exam: External auditory canals and tympanic membranes are normal.      Hearing assessment shows no problems with normal conversation.    Nose exam, internally, reveals nasal mucosa, septum and turbinates are  unremarkable.    Teeth, Gingiva, and Lip Exams: No lesions or evidence of infection.      Oropharynx demonstrates oral mucosa, salivary glands, tongue, tonsils, posterior pharynx, hard-soft palates are normal.  EYES: see above  NECK: Neck tissue exam demonstrates no masses, symmetrical, and trachea is midline.      LUNGS and RESPIRATORY: Lung auscultation elicits no wheezing, rhonci, rales or rubs and with equal breath sounds.    Respiratory effort described as breathing is unlabored and chest movement is symmetrical.    HEART (Cardiovascular): Heart auscultation discovers regular rate and rhythm; no murmur, gallop or rub. Normal heart sounds.    ABDOMEN (Gastrointestinal): Mass/Tenderness Exam: Neither are present.     Liver/Spleen: No hepatomegaly or splenomegaly.   MUSCULOSKELETAL (BJE): Inspection-Palpation: No major bone, joint, tendon, or muscle changes.      NEUROLOGICAL: Alert and oriented. No major deficits of coordination or sensation.      PSYCHIATRIC: Insight and judgment appear  both to be intact and appropriate.    Mood and affect are described as normal mood and full affect.    SKIN: Skin Inspection: No rashes or lesions.  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  Actions:  Plans for New Diagnosis:  Lab/Tests:  per anesthesia X-rays:  Studies (other than lab or x-ray):  Old Records Requested:  Record Release obtained, and all previous records are requested.    ___________________________ Katie [name], Jr. Starter - Inactive Problems:    Rheumatoids Arthritis (neck)  

## 2011-10-14 NOTE — Progress Notes (Signed)
Dr. Harlon Flor called for orders to be signed in epic on patient. Stated he would take care of them after the case he was just getting ready to start.

## 2011-10-14 NOTE — Anesthesia Preprocedure Evaluation (Addendum)
Anesthesia Evaluation  Patient identified by MRN, date of birth, ID band Patient awake    Reviewed: Allergy & Precautions, H&P , NPO status , Patient's Chart, lab work & pertinent test results  Airway Mallampati: I TM Distance: >3 FB Neck ROM: full    Dental   Pulmonary          Cardiovascular Rhythm:regular Rate:Normal     Neuro/Psych  Neuromuscular disease    GI/Hepatic   Endo/Other    Renal/GU      Musculoskeletal   Abdominal   Peds  Hematology   Anesthesia Other Findings   Reproductive/Obstetrics                          Anesthesia Physical Anesthesia Plan  ASA: I  Anesthesia Plan: General   Post-op Pain Management:    Induction: Intravenous  Airway Management Planned: Oral ETT  Additional Equipment:   Intra-op Plan:   Post-operative Plan: Extubation in OR  Informed Consent: I have reviewed the patients History and Physical, chart, labs and discussed the procedure including the risks, benefits and alternatives for the proposed anesthesia with the patient or authorized representative who has indicated his/her understanding and acceptance.     Plan Discussed with: CRNA, Anesthesiologist and Surgeon  Anesthesia Plan Comments: (Discussed Dentures. Pt wants to keep her teeth in. Explained risks including damage and she accepts. GES)        Anesthesia Quick Evaluation

## 2011-10-16 ENCOUNTER — Encounter (HOSPITAL_COMMUNITY): Payer: Self-pay | Admitting: Ophthalmology

## 2012-01-20 SURGERY — TRABECULECTOMY
Anesthesia: General | Laterality: Right

## 2012-01-20 MED ORDER — TETRACAINE HCL 0.5 % OP SOLN: 1.0000 [drp] | OPHTHALMIC | Status: DC

## 2012-01-22 ENCOUNTER — Ambulatory Visit: Admit: 2012-01-22 | Payer: Self-pay | Admitting: Ophthalmology

## 2012-12-06 ENCOUNTER — Encounter (HOSPITAL_BASED_OUTPATIENT_CLINIC_OR_DEPARTMENT_OTHER): Payer: Self-pay | Admitting: *Deleted

## 2012-12-06 NOTE — Progress Notes (Signed)
No labs needed-pt was booked local-dr told her she would be sedated-called sherri to chg status

## 2012-12-08 ENCOUNTER — Other Ambulatory Visit: Payer: Self-pay | Admitting: Orthopedic Surgery

## 2012-12-09 ENCOUNTER — Encounter (HOSPITAL_BASED_OUTPATIENT_CLINIC_OR_DEPARTMENT_OTHER): Payer: Self-pay | Admitting: *Deleted

## 2012-12-09 ENCOUNTER — Encounter (HOSPITAL_BASED_OUTPATIENT_CLINIC_OR_DEPARTMENT_OTHER)
Admission: RE | Disposition: A | Discharge status: Home / Self Care | Payer: Self-pay | Source: Ambulatory Visit | Attending: Orthopedic Surgery

## 2012-12-09 ENCOUNTER — Ambulatory Visit (HOSPITAL_BASED_OUTPATIENT_CLINIC_OR_DEPARTMENT_OTHER): Payer: Commercial Managed Care - PPO | Admitting: Certified Registered"

## 2012-12-09 ENCOUNTER — Ambulatory Visit (HOSPITAL_BASED_OUTPATIENT_CLINIC_OR_DEPARTMENT_OTHER)
Admission: RE | Admit: 2012-12-09 | Discharge: 2012-12-09 | Disposition: A | Discharge status: Home / Self Care | Payer: Commercial Managed Care - PPO | Source: Ambulatory Visit | Attending: Orthopedic Surgery | Admitting: Orthopedic Surgery

## 2012-12-09 ENCOUNTER — Encounter (HOSPITAL_BASED_OUTPATIENT_CLINIC_OR_DEPARTMENT_OTHER): Payer: Commercial Managed Care - PPO | Admitting: Certified Registered"

## 2012-12-09 DIAGNOSIS — G5600 Carpal tunnel syndrome, unspecified upper limb: Secondary | ICD-10-CM | POA: Insufficient documentation

## 2012-12-09 DIAGNOSIS — K644 Residual hemorrhoidal skin tags: Secondary | ICD-10-CM | POA: Insufficient documentation

## 2012-12-09 DIAGNOSIS — F3289 Other specified depressive episodes: Secondary | ICD-10-CM | POA: Insufficient documentation

## 2012-12-09 DIAGNOSIS — IMO0001 Reserved for inherently not codable concepts without codable children: Secondary | ICD-10-CM | POA: Insufficient documentation

## 2012-12-09 DIAGNOSIS — F329 Major depressive disorder, single episode, unspecified: Secondary | ICD-10-CM | POA: Insufficient documentation

## 2012-12-09 DIAGNOSIS — E669 Obesity, unspecified: Secondary | ICD-10-CM | POA: Insufficient documentation

## 2012-12-09 DIAGNOSIS — Z87891 Personal history of nicotine dependence: Secondary | ICD-10-CM | POA: Insufficient documentation

## 2012-12-09 DIAGNOSIS — H409 Unspecified glaucoma: Secondary | ICD-10-CM | POA: Insufficient documentation

## 2012-12-09 DIAGNOSIS — Z79899 Other long term (current) drug therapy: Secondary | ICD-10-CM | POA: Insufficient documentation

## 2012-12-09 DIAGNOSIS — J45909 Unspecified asthma, uncomplicated: Secondary | ICD-10-CM | POA: Insufficient documentation

## 2012-12-09 HISTORY — DX: Presence of dental prosthetic device (complete) (partial): Z97.2

## 2012-12-09 HISTORY — DX: Encounter for fitting and adjustment of spectacles and contact lenses: Z46.0

## 2012-12-09 HISTORY — PX: CARPAL TUNNEL RELEASE: SHX101

## 2012-12-09 LAB — POCT HEMOGLOBIN-HEMACUE: Hemoglobin: 11.6 g/dL — ABNORMAL LOW (ref 12.0–15.0)

## 2012-12-09 SURGERY — CARPAL TUNNEL RELEASE
Anesthesia: Monitor Anesthesia Care | Site: Wrist | Laterality: Left

## 2012-12-09 MED ORDER — OXYCODONE HCL 5 MG/5ML PO SOLN: 5.0000 mg | Freq: Once | ORAL | Status: DC | PRN

## 2012-12-09 MED ORDER — FENTANYL CITRATE 0.05 MG/ML IJ SOLN
50.0000 ug | INTRAMUSCULAR | Status: DC | PRN
Start: 2012-12-09 — End: 2012-12-09

## 2012-12-09 MED ORDER — LIDOCAINE HCL 1 % IJ SOLN
INTRAMUSCULAR | Status: DC | PRN
  Administered 2012-12-09: 12:00:00 via INTRAMUSCULAR

## 2012-12-09 MED ORDER — HYDROCODONE-ACETAMINOPHEN 5-325 MG PO TABS: 2.0000 | ORAL_TABLET | Freq: Four times a day (QID) | ORAL | Status: DC | PRN

## 2012-12-09 MED ORDER — MIDAZOLAM HCL 5 MG/5ML IJ SOLN
INTRAMUSCULAR | Status: DC | PRN
  Administered 2012-12-09: 1 mg via INTRAVENOUS

## 2012-12-09 MED ORDER — ONDANSETRON HCL 4 MG/2ML IJ SOLN: 4.0000 mg | Freq: Once | INTRAMUSCULAR | Status: DC | PRN

## 2012-12-09 MED ORDER — MIDAZOLAM HCL 2 MG/2ML IJ SOLN
INTRAMUSCULAR | Status: AC
  Filled 2012-12-09: qty 2

## 2012-12-09 MED ORDER — OXYCODONE HCL 5 MG PO TABS: 5.0000 mg | ORAL_TABLET | Freq: Once | ORAL | Status: DC | PRN

## 2012-12-09 MED ORDER — FENTANYL CITRATE 0.05 MG/ML IJ SOLN
INTRAMUSCULAR | Status: AC
  Filled 2012-12-09: qty 2

## 2012-12-09 MED ORDER — SODIUM CHLORIDE 0.45 % IV SOLN: INTRAVENOUS | Status: DC

## 2012-12-09 MED ORDER — LACTATED RINGERS IV SOLN
INTRAVENOUS | Status: DC
  Administered 2012-12-09: 10:00:00 via INTRAVENOUS

## 2012-12-09 MED ORDER — MIDAZOLAM HCL 2 MG/2ML IJ SOLN: 1.0000 mg | INTRAMUSCULAR | Status: DC | PRN

## 2012-12-09 MED ORDER — CHLORHEXIDINE GLUCONATE 4 % EX LIQD: 60.0000 mL | Freq: Once | CUTANEOUS | Status: DC

## 2012-12-09 MED ORDER — CEFAZOLIN SODIUM-DEXTROSE 2-3 GM-% IV SOLR
2.0000 g | INTRAVENOUS | Status: AC
  Administered 2012-12-09: 2 g via INTRAVENOUS

## 2012-12-09 MED ORDER — HYDROMORPHONE HCL PF 1 MG/ML IJ SOLN: 0.2500 mg | INTRAMUSCULAR | Status: DC | PRN

## 2012-12-09 MED ORDER — FENTANYL CITRATE 0.05 MG/ML IJ SOLN
INTRAMUSCULAR | Status: DC | PRN
  Administered 2012-12-09: 50 ug via INTRAVENOUS

## 2012-12-09 SURGICAL SUPPLY — 48 items
BANDAGE ELASTIC 3 VELCRO ST LF (GAUZE/BANDAGES/DRESSINGS) ×2 IMPLANT
BLADE CARPAL TUNNEL SNGL USE (BLADE) ×2 IMPLANT
BLADE SURG 15 STRL LF DISP TIS (BLADE) ×2 IMPLANT
BLADE SURG 15 STRL SS (BLADE) ×4
BNDG CONFORM 3 STRL LF (GAUZE/BANDAGES/DRESSINGS) ×2 IMPLANT
BNDG PLASTER X FAST 3X3 WHT LF (CAST SUPPLIES) ×1 IMPLANT
BNDG PLSTR 9X3 FST ST WHT (CAST SUPPLIES)
BRUSH SCRUB EZ PLAIN DRY (MISCELLANEOUS) ×2 IMPLANT
CORDS BIPOLAR (ELECTRODE) ×2 IMPLANT
COVER MAYO STAND STRL (DRAPES) ×2 IMPLANT
COVER TABLE BACK 60X90 (DRAPES) ×2 IMPLANT
CUFF TOURNIQUET SINGLE 18IN (TOURNIQUET CUFF) ×1 IMPLANT
DRAIN PENROSE 1/4X12 LTX STRL (WOUND CARE) IMPLANT
DRAPE EXTREMITY T 121X128X90 (DRAPE) ×2 IMPLANT
DRAPE SURG 17X23 STRL (DRAPES) ×2 IMPLANT
DRSG EMULSION OIL 3X3 NADH (GAUZE/BANDAGES/DRESSINGS) ×2 IMPLANT
GAUZE SPONGE 4X4 16PLY XRAY LF (GAUZE/BANDAGES/DRESSINGS) IMPLANT
GAUZE XEROFORM 1X8 LF (GAUZE/BANDAGES/DRESSINGS) ×1 IMPLANT
GLOVE BIO SURGEON STRL SZ 6.5 (GLOVE) ×2 IMPLANT
GLOVE BIOGEL M STRL SZ7.5 (GLOVE) ×2 IMPLANT
GLOVE BIOGEL PI IND STRL 7.0 (GLOVE) IMPLANT
GLOVE BIOGEL PI INDICATOR 7.0 (GLOVE) ×1
GLOVE SS BIOGEL STRL SZ 8 (GLOVE) ×1 IMPLANT
GLOVE SUPERSENSE BIOGEL SZ 8 (GLOVE) ×1
GOWN PREVENTION PLUS XLARGE (GOWN DISPOSABLE) ×2 IMPLANT
GOWN PREVENTION PLUS XXLARGE (GOWN DISPOSABLE) ×3 IMPLANT
LOOP VESSEL MAXI BLUE (MISCELLANEOUS) IMPLANT
NDL HYPO 25X1 1.5 SAFETY (NEEDLE) ×1 IMPLANT
NDL SAFETY ECLIPSE 18X1.5 (NEEDLE) ×1 IMPLANT
NEEDLE HYPO 18GX1.5 SHARP (NEEDLE) ×2
NEEDLE HYPO 22GX1.5 SAFETY (NEEDLE) ×2 IMPLANT
NEEDLE HYPO 25X1 1.5 SAFETY (NEEDLE) ×4 IMPLANT
NS IRRIG 1000ML POUR BTL (IV SOLUTION) ×2 IMPLANT
PACK BASIN DAY SURGERY FS (CUSTOM PROCEDURE TRAY) ×2 IMPLANT
PAD ALCOHOL SWAB (MISCELLANEOUS) ×16 IMPLANT
PAD CAST 3X4 CTTN HI CHSV (CAST SUPPLIES) ×2 IMPLANT
PADDING CAST ABS 4INX4YD NS (CAST SUPPLIES) ×1
PADDING CAST ABS COTTON 4X4 ST (CAST SUPPLIES) ×1 IMPLANT
PADDING CAST COTTON 3X4 STRL (CAST SUPPLIES) ×2
SPONGE GAUZE 4X4 12PLY (GAUZE/BANDAGES/DRESSINGS) IMPLANT
STOCKINETTE 4X48 STRL (DRAPES) ×2 IMPLANT
SUT PROLENE 4 0 PS 2 18 (SUTURE) ×2 IMPLANT
SYR BULB 3OZ (MISCELLANEOUS) ×2 IMPLANT
SYR CONTROL 10ML LL (SYRINGE) ×4 IMPLANT
TOWEL OR 17X24 6PK STRL BLUE (TOWEL DISPOSABLE) ×2 IMPLANT
TOWEL OR NON WOVEN STRL DISP B (DISPOSABLE) ×2 IMPLANT
TRAY DSU PREP LF (CUSTOM PROCEDURE TRAY) ×2 IMPLANT
UNDERPAD 30X30 INCONTINENT (UNDERPADS AND DIAPERS) ×2 IMPLANT

## 2012-12-09 NOTE — Op Note (Signed)
See Dictation #[id number] Amanda Pea MD

## 2012-12-09 NOTE — H&P (Signed)
Katie [name] is an 52 y.o. female.   Chief Complaint: L CTS HPI: Patient presents for left carpal tunnel release she understands risk and benefits. Katie [name].Patient presents for evaluation and treatment of the of their upper extremity predicament. The patient denies neck back chest or of abdominal pain. The patient notes that they have no lower extremity problems. The patient from primarily complains of the upper extremity pain noted.  Past Medical History  Diagnosis Date  . Cystocele   . Depression   . HPV (human papilloma virus) infection   . Blood transfusion   . Glaucoma     RIGHT EYE  . External hemorrhoids   . Fibromyalgia   . Asthma     no attack since childhood  . Contact lens/glasses fitting     wears glasses or contacts  . Wears dentures     top    Past Surgical History  Procedure Laterality Date  . Cystoscopy    . Breast enhancement surgery  2000  . Carpal tunnel release  1995    rt  . Tubaligation  1980  . Appendectomy  1983  . Bladder suspension  2010  . Mini shunt insertion  10/14/2011    Procedure: INSERTION OF MINI SHUNT;  Surgeon: Chalmers Guest, MD;  Location: [facility] OR;  Service: Ophthalmology;  Laterality: Right;  . Diagnostic laparoscopy    . Abdominal exploration surgery  1984    bleed after hyst  . Vaginal hysterectomy  1983    Family History  Problem Relation Age of Onset  . Diabetes      Grandmother  . Bone cancer Daughter     Died at age 63  . Hypertension Mother   . Hypothyroidism Mother    Social History:  reports that she quit smoking about 19 years ago. She has never used smokeless tobacco. She reports that she drinks alcohol. She reports that she does not use illicit drugs.  Allergies:  Allergies  Allergen Reactions  . Codeine Nausea And Vomiting    Medications Prior to Admission  Medication Sig Dispense Refill  . calcium carbonate 1250 MG capsule Take 1,250 mg by mouth 2 (two) times daily with a meal.      . cholecalciferol (VITAMIN  D) 1000 UNITS tablet Take 1,000 Units by mouth daily.      . DULoxetine (CYMBALTA) 30 MG capsule Take 60 mg by mouth daily. In afternoon      . estrogen-methylTESTOSTERone (ESTRATEST HS) 0.625-1.25 MG per tablet Take 1 tablet by mouth daily.        Katie [name] ibandronate (BONIVA) 150 MG tablet Take 150 mg by mouth every 30 (thirty) days. On the 1st of the month.  Take in the morning with a full glass of water, on an empty stomach, and do not take anything else by mouth or lie down for the next 30 min.      . Multiple Vitamin (MULTIVITAMIN) tablet Take 1 tablet by mouth daily.        Katie [name] pyridOXINE (VITAMIN B-6) 100 MG tablet Take 100 mg by mouth daily.      . vitamin C (ASCORBIC ACID) 500 MG tablet Take 500 mg by mouth daily.      . fish oil-omega-3 fatty acids 1000 MG capsule Take 1 g by mouth daily.          Results for orders placed during the hospital encounter of 12/09/12 (from the past 48 hour(s))  POCT HEMOGLOBIN-HEMACUE     Status: Abnormal  Collection Time    12/09/12 10:14 AM      Result Value Range   Hemoglobin 11.6 (*) 12.0 - 15.0 g/dL   No results found.  Review of Systems  Constitutional: Negative.   Eyes: Negative.   Respiratory: Negative.   Cardiovascular: Negative.   Gastrointestinal: Negative.   Genitourinary: Negative.   Skin: Negative.   Neurological: Negative.     Blood pressure 124/75, pulse 63, temperature 97.6 F (36.4 C), temperature source Oral, resp. rate 20, height 5' (1.524 m), weight 51.256 kg (113 lb), SpO2 99.00%. Physical Exam  Left carpal tunnel syndrome .Katie KitchenThe patient is alert and oriented in no acute distress the patient complains of pain in the affected upper extremity.  The patient is noted to have a normal HEENT exam.  Lung fields show equal chest expansion and no shortness of breath  abdomen exam is nontender without distention.  Lower extremity examination does not show any fracture dislocation or blood clot symptoms.  Pelvis is stable neck and  back are stable and nontender Assessment/Plan Plan L  limited open carpal tunnel release  .Katie KitchenWe are planning surgery for your upper extremity. The risk and benefits of surgery include risk of bleeding infection anesthesia damage to normal structures and failure of the surgery to accomplish its intended goals of relieving symptoms and restoring function with this in mind we'll going to proceed. I have specifically discussed with the patient the pre-and postoperative regime and the does and don'ts and risk and benefits in great detail. Risk and benefits of surgery also include risk of dystrophy chronic nerve pain failure of the healing process to go onto completion and other inherent risks of surgery The relavent the pathophysiology of the disease/injury process, as well as the alternatives for treatment and postoperative course of action has been discussed in great detail with the patient who desires to proceed.  We will do everything in our power to help you (the patient) restore function to the upper extremity. Is a pleasure to see this patient today.  Karen Chafe 12/09/2012, 10:54 AM

## 2012-12-09 NOTE — Anesthesia Preprocedure Evaluation (Addendum)
Anesthesia Evaluation  Patient identified by MRN, date of birth, ID band Patient awake    Reviewed: Allergy & Precautions, H&P , NPO status , Patient's Chart, lab work & pertinent test results  Airway Mallampati: II TM Distance: >3 FB Neck ROM: Full    Dental  (+) Teeth Intact and Dental Advisory Given   Pulmonary former smoker,  breath sounds clear to auscultation        Cardiovascular Rhythm:Regular Rate:Normal     Neuro/Psych    GI/Hepatic   Endo/Other    Renal/GU      Musculoskeletal   Abdominal (+) - obese,   Peds  Hematology   Anesthesia Other Findings   Reproductive/Obstetrics                          Anesthesia Physical Anesthesia Plan  ASA: II  Anesthesia Plan: MAC   Post-op Pain Management:    Induction: Intravenous  Airway Management Planned: Natural Airway and Simple Face Mask  Additional Equipment:   Intra-op Plan:   Post-operative Plan:   Informed Consent: I have reviewed the patients History and Physical, chart, labs and discussed the procedure including the risks, benefits and alternatives for the proposed anesthesia with the patient or authorized representative who has indicated his/her understanding and acceptance.   Dental advisory given  Plan Discussed with: CRNA and Anesthesiologist  Anesthesia Plan Comments:         Anesthesia Quick Evaluation

## 2012-12-12 ENCOUNTER — Encounter (HOSPITAL_BASED_OUTPATIENT_CLINIC_OR_DEPARTMENT_OTHER): Payer: Self-pay | Admitting: Orthopedic Surgery

## 2012-12-12 NOTE — Transfer of Care (Signed)
Immediate Anesthesia Transfer of Care Note  Patient: Katie [name]  Procedure(s) Performed: Procedure(s): LEFT LIMITED OPEN CARPAL TUNNEL RELEASE (Left)  Patient Location: PACU  Anesthesia Type:MAC  Level of Consciousness: awake, alert , oriented and patient cooperative  Airway & Oxygen Therapy: Patient Spontanous Breathing  Post-op Assessment: Report given to PACU RN and Post -op Vital signs reviewed and stable  Post vital signs: Reviewed and stable  Complications: No apparent anesthesia complications

## 2012-12-12 NOTE — Op Note (Signed)
NAME:  Katie [name], COIN           ACCOUNT NO.:  [id number]  MEDICAL RECORD NO.:  [id number]  LOCATION:                                 FACILITY:  PHYSICIAN:  Dionne Ano. Amanda Pea, M.D.     DATE OF BIRTH:  DATE OF PROCEDURE: DATE OF DISCHARGE:                              OPERATIVE REPORT   PREOPERATIVE DIAGNOSIS:  Left carpal tunnel syndrome.  POSTOPERATIVE DIAGNOSIS:  Left carpal tunnel syndrome.  PROCEDURE:  Left median nerve/__________ carpal tunnel release.  SURGEON:  Dionne Ano. Amanda Pea, M.D.  ASSISTANT:  None.  COMPLICATIONS:  None.  ANESTHESIA:  Peripheral nerve block with IV sedation keeping the patient awake, alert, and oriented the entire case.  TOURNIQUET TIME:  Less than 10 minutes.  INDICATIONS:  Pleasant 52 year old female with significant carpal tunnel syndrome.  It is severe with some __________ atrophy.  She desires release.  I have discussed the risks, benefits and we discussed the do's and don'ts, __________ risk of infection, dystrophy, neurovascular compromise, and other issues.  OPERATION IN DETAIL:  The patient was seen by myself and Anesthesia, taken to the operative suite, __________ nerve block, prepped and draped in usual sterile fashion with Betadine scrub and paint.  Once this was done, final time-out was called.  An incision was made at the distal edge of transverse carpal ligament.  Previously, the median nerve/peripheral nerve block provided excellent anesthesia utilizing 20 mL of Sensorcaine, lidocaine with epinephrine.  Once incision was made, dissection was carried down.  Palmar fascia was incised.  Distal edge of transcarpal ligament was identified under 4.0 loupe magnification, released under direct vision, fat pad egressed nicely.  Superficial palmar arch and median nerve were carefully protected.  I released her off the ulnar ledge.  Distal to proximal dissection was carried out intact, __________ under the proximal leading leaflet of  the transverse carpal ligament.  Following this, the __________ placed.  __________ releasing the proximal leaflet.  I then removed __________ irrigated copiously.  She was awake, alert, and oriented during all points __________ and times during the operation.  She was nicely decompressed. The median nerve was hyperemic.  No space-occupying lesions noted; however, this was limited open approach and deep canal inspection was not carried out.  Following this, hemostasis was secured.  The tourniquet was deflated.  The wound was closed with Prolene.  Sterile dressing was applied.  She was taken to recovery room in stable condition.  She will be monitored and discharged home.  Return to see [place] in the office in approximately 7 days, therapy in 12 days and proceed according to our standard postop algorithm.  These notes have been discussed and all questions addressed.     Dionne Ano. Amanda Pea, M.D.     [facility]  D:  12/09/2012  T:  12/10/2012  Job:  [id number]

## 2012-12-12 NOTE — Anesthesia Postprocedure Evaluation (Signed)
  Anesthesia Post-op Note  Patient: Katie [name]  Procedure(s) Performed: Procedure(s): LEFT LIMITED OPEN CARPAL TUNNEL RELEASE (Left)  Patient Location: PACU  Anesthesia Type:MAC  Level of Consciousness: awake, alert  and oriented  Airway and Oxygen Therapy: Patient Spontanous Breathing  Post-op Pain: none  Post-op Assessment: Post-op Vital signs reviewed, Patient's Cardiovascular Status Stable, Patent Airway and Pain level controlled  Post-op Vital Signs: stable  Complications: No apparent anesthesia complications

## 2013-01-05 DIAGNOSIS — R55 Syncope and collapse: Secondary | ICD-10-CM

## 2013-01-05 HISTORY — DX: Syncope and collapse: R55

## 2013-08-15 ENCOUNTER — Other Ambulatory Visit (HOSPITAL_COMMUNITY): Payer: Self-pay | Admitting: Family Medicine

## 2013-08-17 ENCOUNTER — Other Ambulatory Visit (HOSPITAL_COMMUNITY): Payer: Self-pay | Admitting: Family Medicine

## 2013-08-17 DIAGNOSIS — M810 Age-related osteoporosis without current pathological fracture: Secondary | ICD-10-CM

## 2013-08-22 ENCOUNTER — Ambulatory Visit (HOSPITAL_COMMUNITY)
Admission: RE | Admit: 2013-08-22 | Discharge: 2013-08-22 | Disposition: A | Discharge status: Home / Self Care | Payer: Commercial Managed Care - PPO | Source: Ambulatory Visit | Attending: Family Medicine | Admitting: Family Medicine

## 2013-08-22 DIAGNOSIS — M810 Age-related osteoporosis without current pathological fracture: Secondary | ICD-10-CM

## 2013-10-03 ENCOUNTER — Telehealth: Payer: Self-pay | Admitting: Family Medicine

## 2013-10-06 NOTE — Telephone Encounter (Signed)
Detailed message left to please call us back if still interested in becoming transferring back to our office.

## 2013-10-09 NOTE — Telephone Encounter (Signed)
Patient has UMR and she is currently not taking any medication at this time. Patient is going to come by and sign records release and she is going to call when she needs appointment. Patient aware to call if she needs anything before time for her physicals.

## 2014-08-27 LAB — HM MAMMOGRAPHY

## 2015-04-09 ENCOUNTER — Encounter: Payer: Self-pay | Admitting: *Deleted

## 2015-04-09 ENCOUNTER — Encounter (INDEPENDENT_AMBULATORY_CARE_PROVIDER_SITE_OTHER): Payer: Self-pay

## 2015-04-09 ENCOUNTER — Encounter: Payer: Self-pay | Admitting: Family Medicine

## 2015-04-09 ENCOUNTER — Ambulatory Visit (INDEPENDENT_AMBULATORY_CARE_PROVIDER_SITE_OTHER): Payer: BLUE CROSS/BLUE SHIELD | Admitting: Family Medicine

## 2015-04-09 VITALS — BP 103/63 | HR 66 | Temp 97.2°F | Ht 60.0 in | Wt 114.6 lb

## 2015-04-09 DIAGNOSIS — M1990 Unspecified osteoarthritis, unspecified site: Secondary | ICD-10-CM

## 2015-04-09 MED ORDER — PREDNISONE 10 MG PO TABS: ORAL_TABLET | ORAL | Status: DC

## 2015-04-09 NOTE — Progress Notes (Signed)
   Subjective:    Patient ID: Katie [name], female    DOB: 25-Apr-1960, 55 y.o.   MRN: [id number]  HPI Patient is here today complaining with bilateral hand pain as a new patient. She states that her knuckles are swelling and she is getting where she can not open tops to her drinks.  Symptoms have been present for about a year. She describes stiffness in the mornings. There is a positive family history in both her mother and grandmother Of arthritis  Review of Systems  Constitutional: Negative.   HENT: Negative.   Eyes: Negative.   Respiratory: Negative.   Cardiovascular: Negative.   Gastrointestinal: Negative.   Endocrine: Negative.   Genitourinary: Negative.   Musculoskeletal:       Bilateral hand pain   Skin: Negative.   Allergic/Immunologic: Negative.   Neurological: Negative.   Hematological: Negative.   Psychiatric/Behavioral: Negative.        There are no active problems to display for this patient.  Outpatient Encounter Prescriptions as of 04/09/2015  Medication Sig  . calcium carbonate 1250 MG capsule Take 1,250 mg by mouth 2 (two) times daily with a meal.  . fish oil-omega-3 fatty acids 1000 MG capsule Take 1 g by mouth daily.    [name] glucosamine-chondroitin 500-400 MG tablet Take 1 tablet by mouth 3 (three) times daily.  [name] ibandronate (BONIVA) 150 MG tablet Take 150 mg by mouth every 30 (thirty) days. On the 1st of the month.  Take in the morning with a full glass of water, on an empty stomach, and do not take anything else by mouth or lie down for the next 30 min.  . Multiple Vitamin (MULTIVITAMIN) tablet Take 1 tablet by mouth daily.    [name] pyridOXINE (VITAMIN B-6) 100 MG tablet Take 100 mg by mouth daily.  . vitamin C (ASCORBIC ACID) 500 MG tablet Take 500 mg by mouth daily.  . [DISCONTINUED] cholecalciferol (VITAMIN D) 1000 UNITS tablet Take 1,000 Units by mouth daily.  . [DISCONTINUED] DULoxetine (CYMBALTA) 30 MG capsule Take 60 mg by mouth daily. In afternoon    . [DISCONTINUED] estrogen-methylTESTOSTERone (ESTRATEST HS) 0.625-1.25 MG per tablet Take 1 tablet by mouth daily. Reported on 04/09/2015  . [DISCONTINUED] HYDROcodone-acetaminophen (NORCO) 5-325 MG per tablet Take 2 tablets by mouth every 6 (six) hours as needed for moderate pain.   No facility-administered encounter medications on file as of 04/09/2015.       Objective:   Physical Exam  Constitutional: She appears well-developed and well-nourished.  Musculoskeletal:  Patient has tender hands with enlargement of the MP joints bilaterally. Grip strength seems good.   BP 103/63 mmHg  Pulse 66  Temp(Src) 97.2 F (36.2 C) (Oral)  Ht 5' (1.524 m)  Wt 114 lb 9.6 oz (51.982 kg)  BMI 22.38 kg/m2        Assessment & Plan:  1. Arthritis SPECT this is rheumatoid disease. Will get baseline labs and refer to rheumatologist in hopes of initiating therapy which his disease modifying rather than just anti-inflammatory. Will treat with prednisone until she sees rheumatologist  Wardell Honour MD - Arthritis Panel

## 2015-04-10 ENCOUNTER — Telehealth: Payer: Self-pay | Admitting: Family Medicine

## 2015-04-10 LAB — ARTHRITIS PANEL
Basophils Absolute: 0 10*3/uL (ref 0.0–0.2)
Basos: 1 %
EOS (ABSOLUTE): 0.1 10*3/uL (ref 0.0–0.4)
Eos: 2 %
Hematocrit: 38 % (ref 34.0–46.6)
Hemoglobin: 12.8 g/dL (ref 11.1–15.9)
IMMATURE GRANS (ABS): 0 10*3/uL (ref 0.0–0.1)
Immature Granulocytes: 0 %
Lymphocytes Absolute: 1.8 10*3/uL (ref 0.7–3.1)
Lymphs: 42 %
MCH: 30.5 pg (ref 26.6–33.0)
MCHC: 33.7 g/dL (ref 31.5–35.7)
MCV: 91 fL (ref 79–97)
MONOS ABS: 0.3 10*3/uL (ref 0.1–0.9)
Monocytes: 6 %
NEUTROS PCT: 49 %
Neutrophils Absolute: 2.1 10*3/uL (ref 1.4–7.0)
Platelets: 157 10*3/uL (ref 150–379)
RBC: 4.19 x10E6/uL (ref 3.77–5.28)
RDW: 14 % (ref 12.3–15.4)
Rhuematoid fact SerPl-aCnc: <10 IU/mL (ref 0.0–13.9)
Sed Rate: 7 mm/hr (ref 0–40)
Uric Acid: 3.5 mg/dL (ref 2.5–7.1)
WBC: 4.3 10*3/uL (ref 3.4–10.8)

## 2015-04-10 NOTE — Telephone Encounter (Signed)
-----   Message from Wardell Honour, MD sent at 04/10/2015  7:39 AM EDT ----- Arthritis panel shows normal sedimentation rate and uric acid and CBC. Basically this is not very helpful if patient has arthritis it is sero- negative

## 2015-04-10 NOTE — Telephone Encounter (Signed)
Aware of lab results and referral to rheumatology.

## 2015-04-10 NOTE — Addendum Note (Signed)
Addended by: Thana Ates on: 04/10/2015 08:21 AM   Modules accepted: Orders

## 2015-04-18 ENCOUNTER — Encounter: Payer: Self-pay | Admitting: *Deleted

## 2015-08-23 ENCOUNTER — Telehealth: Payer: Self-pay | Admitting: Family Medicine

## 2015-10-25 ENCOUNTER — Encounter: Payer: Self-pay | Admitting: Gastroenterology

## 2015-12-05 ENCOUNTER — Ambulatory Visit (AMBULATORY_SURGERY_CENTER): Payer: Self-pay | Admitting: *Deleted

## 2015-12-05 ENCOUNTER — Encounter: Payer: Self-pay | Admitting: Gastroenterology

## 2015-12-05 VITALS — Ht 60.0 in | Wt 111.0 lb

## 2015-12-05 DIAGNOSIS — Z86010 Personal history of colonic polyps: Secondary | ICD-10-CM

## 2015-12-05 MED ORDER — NA SULFATE-K SULFATE-MG SULF 17.5-3.13-1.6 GM/177ML PO SOLN: 1.0000 | Freq: Once | ORAL | 0 refills | Status: AC

## 2015-12-05 NOTE — Progress Notes (Signed)
No egg or soy allergy known to patient  No issues with past sedation with any surgeries  or procedures, no intubation problems  No diet pills per patient No home 02 use per patient  No blood thinners per patient  Pt denies issues with constipation  No A fib or A flutter   

## 2015-12-17 ENCOUNTER — Encounter: Payer: Commercial Managed Care - PPO | Admitting: Gastroenterology

## 2016-02-04 ENCOUNTER — Ambulatory Visit
Admission: RE | Admit: 2016-02-04 | Discharge: 2016-02-04 | Disposition: A | Discharge status: Home / Self Care | Payer: BLUE CROSS/BLUE SHIELD | Source: Ambulatory Visit | Attending: Rheumatology | Admitting: Rheumatology

## 2016-02-04 ENCOUNTER — Other Ambulatory Visit: Payer: Self-pay | Admitting: Rheumatology

## 2016-02-04 DIAGNOSIS — R1084 Generalized abdominal pain: Secondary | ICD-10-CM

## 2016-05-04 ENCOUNTER — Encounter: Payer: Self-pay | Admitting: Family Medicine

## 2016-05-04 ENCOUNTER — Ambulatory Visit (INDEPENDENT_AMBULATORY_CARE_PROVIDER_SITE_OTHER): Payer: BLUE CROSS/BLUE SHIELD | Admitting: Family Medicine

## 2016-05-04 VITALS — BP 109/67 | HR 75 | Temp 97.2°F | Ht 61.0 in | Wt 110.8 lb

## 2016-05-04 DIAGNOSIS — F32A Depression, unspecified: Secondary | ICD-10-CM

## 2016-05-04 DIAGNOSIS — F419 Anxiety disorder, unspecified: Secondary | ICD-10-CM | POA: Diagnosis not present

## 2016-05-04 DIAGNOSIS — F329 Major depressive disorder, single episode, unspecified: Secondary | ICD-10-CM

## 2016-05-04 MED ORDER — LORAZEPAM 0.5 MG PO TABS: 0.5000 mg | ORAL_TABLET | Freq: Two times a day (BID) | ORAL | 1 refills | Status: DC | PRN

## 2016-05-04 MED ORDER — ESCITALOPRAM OXALATE 5 MG PO TABS: 5.0000 mg | ORAL_TABLET | Freq: Every day | ORAL | 1 refills | Status: DC

## 2016-05-04 NOTE — Patient Instructions (Signed)
Great to meet you!  Consider counseling, check out [email or web address] to look for who is available.   Start lexapro 1 pill once daily, this will not make a quick change but you will see improvements in 3-6 weeks.   Try ativan (lorazepam) only as needed for anxiety.

## 2016-05-04 NOTE — Progress Notes (Signed)
   HPI  Patient presents today here with anxiety and depression.  Patient explains that she's had a very difficult time recently. Her husband has Parkinson's disease, her mother recently heart surgery, her daughter recently had knee surgery. She previously lost a daughter to [place] and states that she's having symptoms similar to that she had at that time.  She's had symptoms for several months.  She describes anhedonia, difficulty concentrating, feeling down and depressed. Increased stooling frequency with separation from her husband.  Patient could not go on a recent trip to the mountains that she normally enjoys. She supposed to go to [place] next week and is concerned that she will not be able to tolerate the trip.  Denies suicidal thoughts  PMH: Smoking status noted ROS: Per HPI  Objective: BP 109/67   Pulse 75   Temp 97.2 F (36.2 C) (Oral)   Ht 5\' 1"  (1.549 m)   Wt 110 lb 12.8 oz (50.3 kg)   BMI 20.94 kg/m  Gen: NAD, alert, cooperative with exam HEENT: NCAT CV: RRR, good S1/S2, no murmur Resp: CTABL, no wheezes, non-labored Ext: No edema, warm Neuro: Alert and oriented, No gross deficits  Assessment and plan:  # Anxiety and depression Next mood disorder, situational, however much more long-lived then just an adjustment disorder. Start Lexapro 5 mg, previously did not tolerate Cymbalta or amitriptyline due to "feeling dopey". Start Ativan, discussed only as needed. Follow-up 3 weeks   Meds ordered this encounter  Medications  . escitalopram (LEXAPRO) 5 MG tablet    Sig: Take 1 tablet (5 mg total) by mouth daily.    Dispense:  30 tablet    Refill:  1  . LORazepam (ATIVAN) 0.5 MG tablet    Sig: Take 1 tablet (0.5 mg total) by mouth 2 (two) times daily as needed for anxiety.    Dispense:  30 tablet    Refill:  [place], MD [facility] 05/04/2016, 8:35 AM

## 2016-05-27 ENCOUNTER — Encounter: Payer: Self-pay | Admitting: Family Medicine

## 2016-05-27 ENCOUNTER — Ambulatory Visit (INDEPENDENT_AMBULATORY_CARE_PROVIDER_SITE_OTHER): Payer: BLUE CROSS/BLUE SHIELD | Admitting: Family Medicine

## 2016-05-27 DIAGNOSIS — L659 Nonscarring hair loss, unspecified: Secondary | ICD-10-CM | POA: Diagnosis not present

## 2016-05-27 DIAGNOSIS — M069 Rheumatoid arthritis, unspecified: Secondary | ICD-10-CM

## 2016-05-27 DIAGNOSIS — F329 Major depressive disorder, single episode, unspecified: Secondary | ICD-10-CM

## 2016-05-27 DIAGNOSIS — F419 Anxiety disorder, unspecified: Secondary | ICD-10-CM

## 2016-05-27 DIAGNOSIS — F32A Anxiety disorder, unspecified: Secondary | ICD-10-CM | POA: Insufficient documentation

## 2016-05-27 MED ORDER — ESCITALOPRAM OXALATE 5 MG PO TABS: 5.0000 mg | ORAL_TABLET | Freq: Every day | ORAL | 3 refills | Status: DC

## 2016-05-27 NOTE — Patient Instructions (Signed)
Great to see you!  Lets follow up in 2-3  months unless you need [place] sooner.   We will call with labs or send them to mychart within 1 week.   We are working on a referral to Dr. Amil Amen at [facility] rheumatology

## 2016-05-27 NOTE — Progress Notes (Signed)
HPI  Patient presents today for follow-up of anxiety and depression, to discuss hair loss, and to discuss rheumatology referral.  Anxiety depression Improved quite a bit on 5 mg of Lexapro No suicidal thoughts Patient feels that she's having much less panic attacks. Has only needed 1 dose of Ativan.  Rheumatoid arthritis Bilateral hand problems, would like referral to new rheumatologist, her previous one as retired.  Hair loss Approximately 1-2 months, patient is using biotin collagen without improvement. She felt it was attributed to stress or prednisone. She stopped prednisone he did not improve. She has improved her stress quite a bit with medication it has not changed. She is using Rogaine.  PMH: Smoking status noted ROS: Per HPI  Objective: BP 102/65   Pulse 63   Temp 97.1 F (36.2 C) (Oral)   Ht 5' 1" (1.549 m)   Wt 110 lb 3.2 oz (50 kg)   BMI 20.82 kg/m  Gen: NAD, alert, cooperative with exam HEENT: NCAT, no appreciable hair loss, no areas of hair thinning CV: RRR, good S1/S2, no murmur Resp: CTABL, no wheezes, non-labored Ext: No edema, warm Neuro: Alert and oriented, No gross deficits  Depression screen PHQ 2/9 05/27/2016 05/04/2016 04/09/2015  Decreased Interest 1 2 0  Down, Depressed, Hopeless 1 3 0  PHQ - 2 Score 2 5 0  Altered sleeping 0 2 -  Tired, decreased energy 3 2 -  Change in appetite 0 1 -  Feeling bad or failure about yourself  0 2 -  Trouble concentrating 2 3 -  Moving slowly or fidgety/restless 0 0 -  Suicidal thoughts 0 0 -  PHQ-9 Score 7 15 -  Difficult doing work/chores Not difficult at all Very difficult -     Assessment and plan:  # Anxiety and depression Improved quite a bit with Lexapro, continue Ativan as needed, no need for refill today  # Hair loss Unclear etiology, consider telogen effluvium or stress response Recommended continuing collagen, biotin, Rogaine Checking TSH and other labs Recommended discussing with her  dermatologist  # Rheumatoid arthritis Stable, patient previously did not do well with methotrexate or arrava Dr. Truslow has retired, refer to [place] [facility] rheum.      Orders Placed This Encounter  Procedures  . Lipid panel  . CBC with Differential/Platelet  . CMP14+EGFR  . Thyroid Panel With TSH  . Ambulatory referral to Rheumatology    Referral Priority:   Routine    Referral Type:   Consultation    Referral Reason:   Specialty Services Required    Requested Specialty:   Rheumatology    Number of Visits Requested:   1    Meds ordered this encounter  Medications  . escitalopram (LEXAPRO) 5 MG tablet    Sig: Take 1 tablet (5 mg total) by mouth daily.    Dispense:  90 tablet    Refill:  3    Sam , MD [facility] 05/27/2016, 8:29 AM     

## 2016-05-28 LAB — LIPID PANEL
CHOLESTEROL TOTAL: 232 mg/dL — AB (ref 100–199)
Chol/HDL Ratio: 2.9 ratio (ref 0.0–4.4)
HDL: 79 mg/dL (ref >39)
LDL CALC: 134 mg/dL — AB (ref 0–99)
Triglycerides: 95 mg/dL (ref 0–149)
VLDL CHOLESTEROL CAL: 19 mg/dL (ref 5–40)

## 2016-05-28 LAB — CBC WITH DIFFERENTIAL/PLATELET
BASOS: 0 %
Basophils Absolute: 0 10*3/uL (ref 0.0–0.2)
EOS (ABSOLUTE): 0.1 10*3/uL (ref 0.0–0.4)
EOS: 2 %
HEMATOCRIT: 39.7 % (ref 34.0–46.6)
Hemoglobin: 13.3 g/dL (ref 11.1–15.9)
Immature Grans (Abs): 0 10*3/uL (ref 0.0–0.1)
Immature Granulocytes: 0 %
Lymphocytes Absolute: 1.5 10*3/uL (ref 0.7–3.1)
Lymphs: 32 %
MCH: 31 pg (ref 26.6–33.0)
MCHC: 33.5 g/dL (ref 31.5–35.7)
MCV: 93 fL (ref 79–97)
MONOS ABS: 0.3 10*3/uL (ref 0.1–0.9)
Monocytes: 7 %
Neutrophils Absolute: 2.8 10*3/uL (ref 1.4–7.0)
Neutrophils: 59 %
PLATELETS: 174 10*3/uL (ref 150–379)
RBC: 4.29 x10E6/uL (ref 3.77–5.28)
RDW: 13.6 % (ref 12.3–15.4)
WBC: 4.8 10*3/uL (ref 3.4–10.8)

## 2016-05-28 LAB — CMP14+EGFR
ALT: 13 IU/L (ref 0–32)
AST: 22 IU/L (ref 0–40)
Albumin/Globulin Ratio: 1.9 (ref 1.2–2.2)
Albumin: 4.4 g/dL (ref 3.5–5.5)
Alkaline Phosphatase: 63 IU/L (ref 39–117)
BUN/Creatinine Ratio: 21 (ref 9–23)
BUN: 18 mg/dL (ref 6–24)
Bilirubin Total: 0.4 mg/dL (ref 0.0–1.2)
CALCIUM: 10.1 mg/dL (ref 8.7–10.2)
CHLORIDE: 103 mmol/L (ref 96–106)
CO2: 25 mmol/L (ref 18–29)
Creatinine, Ser: 0.86 mg/dL (ref 0.57–1.00)
GFR calc Af Amer: 86 mL/min/{1.73_m2} (ref >59)
GFR calc non Af Amer: 75 mL/min/{1.73_m2} (ref >59)
GLOBULIN, TOTAL: 2.3 g/dL (ref 1.5–4.5)
Glucose: 74 mg/dL (ref 65–99)
Potassium: 3.8 mmol/L (ref 3.5–5.2)
SODIUM: 142 mmol/L (ref 134–144)
Total Protein: 6.7 g/dL (ref 6.0–8.5)

## 2016-05-28 LAB — THYROID PANEL WITH TSH
Free Thyroxine Index: 1.8 (ref 1.2–4.9)
T3 Uptake Ratio: 28 % (ref 24–39)
T4 TOTAL: 6.4 ug/dL (ref 4.5–12.0)
TSH: 1.5 u[IU]/mL (ref 0.450–4.500)

## 2016-07-15 ENCOUNTER — Ambulatory Visit (INDEPENDENT_AMBULATORY_CARE_PROVIDER_SITE_OTHER): Payer: BLUE CROSS/BLUE SHIELD | Admitting: Family Medicine

## 2016-07-15 ENCOUNTER — Encounter: Payer: Self-pay | Admitting: Family Medicine

## 2016-07-15 VITALS — BP 94/56 | HR 75 | Temp 98.0°F | Ht 61.0 in | Wt 108.8 lb

## 2016-07-15 DIAGNOSIS — F329 Major depressive disorder, single episode, unspecified: Secondary | ICD-10-CM | POA: Diagnosis not present

## 2016-07-15 DIAGNOSIS — F419 Anxiety disorder, unspecified: Secondary | ICD-10-CM | POA: Diagnosis not present

## 2016-07-15 DIAGNOSIS — M069 Rheumatoid arthritis, unspecified: Secondary | ICD-10-CM

## 2016-07-15 DIAGNOSIS — F32A Depression, unspecified: Secondary | ICD-10-CM

## 2016-07-15 MED ORDER — PREDNISONE 10 MG PO TABS: 10.0000 mg | ORAL_TABLET | Freq: Every day | ORAL | 1 refills | Status: DC

## 2016-07-15 MED ORDER — ESCITALOPRAM OXALATE 10 MG PO TABS
10.0000 mg | ORAL_TABLET | Freq: Every day | ORAL | 3 refills | Status: DC
Start: 2016-07-15 — End: 2016-10-26

## 2016-07-15 NOTE — Progress Notes (Signed)
   HPI  Patient presents today here for discussion about rheumatoid arthritis and depression.  Depression Was doing much better with 5 mg Lexapro, takes that she's had many issues in the family recently would like to change the medication dose. She feels that it is very beneficial but needs a little bit more. No SI.  Rheumatoid arthritis Her rheumatologist has recently retired, she has follow-up scheduled with rheumatology but that is not for another 2 months. She would like a refill of prednisone, she was previously taking 10-20 mg daily.  PMH: Smoking status noted ROS: Per HPI  Objective: BP (!) 94/56   Pulse 75   Temp 98 F (36.7 C) (Oral)   Ht 5\' 1"  (1.549 m)   Wt 108 lb 12.8 oz (49.4 kg)   BMI 20.56 kg/m  Gen: NAD, alert, cooperative with exam HEENT: NCAT, EOMI, PERRL CV: RRR, good S1/S2, no murmur Resp: CTABL, no wheezes, non-labored Ext: No edema, warm Neuro: Alert and oriented, No gross deficits Depression screen [facility] 2/9 07/15/2016 05/27/2016 05/04/2016 04/09/2015  Decreased Interest 2 1 2  0  Down, Depressed, Hopeless 2 1 3  0  PHQ - 2 Score 4 2 5  0  Altered sleeping 2 0 2 -  Tired, decreased energy 2 3 2  -  Change in appetite 2 0 1 -  Feeling bad or failure about yourself  1 0 2 -  Trouble concentrating 2 2 3  -  Moving slowly or fidgety/restless 0 0 0 -  Suicidal thoughts 0 0 0 -  PHQ-9 Score 13 7 15  -  Difficult doing work/chores - Not difficult at all Very difficult -     Assessment and plan:  # Anxiety and depression. Slightly worsened, helped by 5 mg Lexapro, however titrate to 10 mg today for better effect. Follow-up in 2-3 months  # Rheumatoid arthritis Worsened off of prednisone Start prednisone 10- 20 mg daily, she takes 10 mg on most days. Handwritten prescription for both given as EMR was down   Meds ordered this encounter  Medications  . Prenatal Multivit-Min-Fe-FA (PRENATAL 1 + IRON PO)    Sig: Take by mouth.  . predniSONE (DELTASONE) 10  MG tablet    Sig: Take 1-2 tablets (10-20 mg total) by mouth daily with breakfast.    Dispense:  60 tablet    Refill:  1  . escitalopram (LEXAPRO) 10 MG tablet    Sig: Take 1 tablet (10 mg total) by mouth daily.    Dispense:  30 tablet    Refill:  [place], MD [facility] 07/15/2016, 4:32 PM

## 2016-08-28 ENCOUNTER — Ambulatory Visit: Payer: BLUE CROSS/BLUE SHIELD | Admitting: Family Medicine

## 2016-09-11 ENCOUNTER — Other Ambulatory Visit: Payer: Self-pay | Admitting: Family Medicine

## 2016-09-11 NOTE — Telephone Encounter (Signed)
Last seen 07/15/16   Dr Wendi Snipes

## 2016-10-26 ENCOUNTER — Other Ambulatory Visit: Payer: Self-pay | Admitting: *Deleted

## 2016-10-26 MED ORDER — ESCITALOPRAM OXALATE 10 MG PO TABS: 10.0000 mg | ORAL_TABLET | Freq: Every day | ORAL | 0 refills | Status: DC

## 2016-11-06 ENCOUNTER — Ambulatory Visit (INDEPENDENT_AMBULATORY_CARE_PROVIDER_SITE_OTHER): Payer: BLUE CROSS/BLUE SHIELD | Admitting: Family Medicine

## 2016-11-06 ENCOUNTER — Encounter: Payer: Self-pay | Admitting: Family Medicine

## 2016-11-06 VITALS — BP 95/54 | HR 73 | Temp 98.2°F | Ht 61.0 in | Wt 115.6 lb

## 2016-11-06 DIAGNOSIS — M069 Rheumatoid arthritis, unspecified: Secondary | ICD-10-CM

## 2016-11-06 DIAGNOSIS — F419 Anxiety disorder, unspecified: Secondary | ICD-10-CM | POA: Diagnosis not present

## 2016-11-06 DIAGNOSIS — L659 Nonscarring hair loss, unspecified: Secondary | ICD-10-CM

## 2016-11-06 DIAGNOSIS — F32A Depression, unspecified: Secondary | ICD-10-CM

## 2016-11-06 DIAGNOSIS — F329 Major depressive disorder, single episode, unspecified: Secondary | ICD-10-CM | POA: Diagnosis not present

## 2016-11-06 MED ORDER — ESCITALOPRAM OXALATE 10 MG PO TABS: 10.0000 mg | ORAL_TABLET | Freq: Every day | ORAL | 0 refills | Status: DC

## 2016-11-06 MED ORDER — ESCITALOPRAM OXALATE 10 MG PO TABS: 10.0000 mg | ORAL_TABLET | Freq: Every day | ORAL | 3 refills | Status: DC

## 2016-11-06 MED ORDER — PREDNISONE 10 MG PO TABS: ORAL_TABLET | ORAL | 0 refills | Status: DC

## 2016-11-06 NOTE — Progress Notes (Signed)
   HPI  Patient presents today for follow-up chronic medical conditions.  Rheumatoid arthritis Has established with rheumatology, needs refill of prednisone for now, she is about to start Humira but still has some time. Stable.  Depression  Doing very well with Lexapro, has taken 2 pills of Ativan since prescribed. No SI   PMH: Smoking status noted ROS: Per HPI  Objective: BP (!) 95/54   Pulse 73   Temp 98.2 F (36.8 C) (Oral)   Ht 5\' 1"  (1.549 m)   Wt 115 lb 9.6 oz (52.4 kg)   BMI 21.84 kg/m  Gen: NAD, alert, cooperative with exam HEENT: NCAT, EOMI, PERRL CV: RRR, good S1/S2, no murmur Resp: CTABL, no wheezes, non-labored Ext: No edema, warm Neuro: Alert and oriented, No gross deficits  Assessment and plan:  #Anxiety and depression Improved, continue Lexapro, f/up 6 months  #Rheumatoid arthritis Stable symptoms, refill prednisone Discussed Humira  Hair loss Improved, patient now taking biotin and prenatal vitamin per dermatology's request. She is happy with the success   Meds ordered this encounter  Medications  . DISCONTD: escitalopram (LEXAPRO) 10 MG tablet    Sig: Take 1 tablet (10 mg total) by mouth daily.    Dispense:  90 tablet    Refill:  0  . predniSONE (DELTASONE) 10 MG tablet    Sig: TAKE 1 TO 2 TABLETS BY MOUTH EVERY DAY FOR RA    Dispense:  60 tablet    Refill:  0  . escitalopram (LEXAPRO) 10 MG tablet    Sig: Take 1 tablet (10 mg total) by mouth daily.    Dispense:  90 tablet    Refill:  [place], MD [facility] 11/06/2016, 8:44 AM

## 2017-01-05 ENCOUNTER — Other Ambulatory Visit: Payer: Self-pay | Admitting: Family Medicine

## 2017-02-03 ENCOUNTER — Other Ambulatory Visit: Payer: Self-pay | Admitting: Family Medicine

## 2017-05-03 ENCOUNTER — Ambulatory Visit: Payer: BLUE CROSS/BLUE SHIELD | Admitting: Family Medicine

## 2017-05-03 ENCOUNTER — Encounter: Payer: Self-pay | Admitting: Family Medicine

## 2017-05-03 VITALS — BP 113/66 | HR 84 | Temp 98.5°F | Ht 61.0 in | Wt 119.0 lb

## 2017-05-03 DIAGNOSIS — M545 Low back pain, unspecified: Secondary | ICD-10-CM

## 2017-05-03 DIAGNOSIS — F329 Major depressive disorder, single episode, unspecified: Secondary | ICD-10-CM | POA: Diagnosis not present

## 2017-05-03 DIAGNOSIS — M069 Rheumatoid arthritis, unspecified: Secondary | ICD-10-CM | POA: Diagnosis not present

## 2017-05-03 DIAGNOSIS — F419 Anxiety disorder, unspecified: Secondary | ICD-10-CM

## 2017-05-03 DIAGNOSIS — F32A Depression, unspecified: Secondary | ICD-10-CM

## 2017-05-03 MED ORDER — OXYCODONE-ACETAMINOPHEN 5-325 MG PO TABS: 1.0000 | ORAL_TABLET | Freq: Four times a day (QID) | ORAL | 0 refills | Status: DC | PRN

## 2017-05-03 MED ORDER — NAPROXEN 500 MG PO TABS: 500.0000 mg | ORAL_TABLET | Freq: Two times a day (BID) | ORAL | 0 refills | Status: DC

## 2017-05-03 MED ORDER — LORAZEPAM 0.5 MG PO TABS: 0.5000 mg | ORAL_TABLET | Freq: Two times a day (BID) | ORAL | 1 refills | Status: DC | PRN

## 2017-05-03 MED ORDER — CYCLOBENZAPRINE HCL 10 MG PO TABS: 10.0000 mg | ORAL_TABLET | Freq: Three times a day (TID) | ORAL | 0 refills | Status: DC | PRN

## 2017-05-03 MED ORDER — TRIAMCINOLONE ACETONIDE 40 MG/ML IJ SUSP
40.0000 mg | Freq: Once | INTRAMUSCULAR | Status: AC
  Administered 2017-05-03: 40 mg via INTRAMUSCULAR

## 2017-05-03 MED ORDER — ESCITALOPRAM OXALATE 10 MG PO TABS: 10.0000 mg | ORAL_TABLET | Freq: Every day | ORAL | 3 refills | Status: DC

## 2017-05-03 NOTE — Patient Instructions (Signed)
Great to see you!  Start naproxen twice daily with food, do not take aleve or ibuprofen with this Take flexeril at night to help you sleep  Try oxycodone for uncontrolled pain, This is a short term medication and only needs to be used as needed.

## 2017-05-03 NOTE — Progress Notes (Signed)
   HPI  Patient presents today here for back pain and follow up depression.   Depression and anxiety are stable, patient needs refill of Ativan and Celexa, patient has only used a few Ativan over the last year, she has not had a refill since her last visit.  Back pain Left-sided low back pain with radiation of the left hip.  Some left groin pain. Patient states that this started her last 2 weeks after putting out around 11,000 pounds of flagstone and sand to build a patio in the backyard. She states that she has been working 14 or more hours a day. She continues to take 10 mg of prednisone daily for RA.  She states that her RA is well controlled with prednisone, she is not interested in DMARDs currently.  She does have an established relationship with [facility] per rheumatology.  PMH: Smoking status noted ROS: Per HPI  Objective: BP 113/66   Pulse 84   Temp 98.5 F (36.9 C) (Oral)   Ht '5\' 1"'$  (1.549 m)   Wt 119 lb (54 kg)   BMI 22.48 kg/m  Gen: NAD, alert, cooperative with exam HEENT: NCAT CV: RRR, good S1/S2, no murmur Resp: CTABL, no wheezes, non-labored Ext: No edema, warm Neuro: Alert and oriented, No gross deficits MSK:  It is to palpation in upper lumbar lower thoracic paraspinal muscles in the left side, no midline tenderness, negative Corky Sox and Fadir of L hip, negative straight leg raise  Assessment and plan:  #Back pain Patient with left-sided low back pain with out sciatica, after strenuous work for over a week. Likely only strain, no red flags With RA as well, given IM Kenalog, continue chronic steroids Oxycodone for breakthrough pain, patient is also on Ativan, however uses it very sparingly.   Scheduled NSAIDs, Flexeril at night   #Anxiety depression Stable at baseline, refill Ativan plus Celexa  #RA Has follow-up with rheumatology, however not very interested in DMARDs at this time Labs   Orders Placed This Encounter  Procedures  . CMP14+EGFR  . CBC  with Differential/Platelet  . Lipid panel    Meds ordered this encounter  Medications  . triamcinolone acetonide (KENALOG-40) injection 40 mg  . cyclobenzaprine (FLEXERIL) 10 MG tablet    Sig: Take 1 tablet (10 mg total) by mouth 3 (three) times daily as needed for muscle spasms.    Dispense:  30 tablet    Refill:  0  . naproxen (NAPROSYN) 500 MG tablet    Sig: Take 1 tablet (500 mg total) by mouth 2 (two) times daily with a meal.    Dispense:  28 tablet    Refill:  0  . oxyCODONE-acetaminophen (PERCOCET/ROXICET) 5-325 MG tablet    Sig: Take 1 tablet by mouth every 6 (six) hours as needed for severe pain.    Dispense:  15 tablet    Refill:  0  . LORazepam (ATIVAN) 0.5 MG tablet    Sig: Take 1 tablet (0.5 mg total) by mouth 2 (two) times daily as needed for anxiety.    Dispense:  30 tablet    Refill:  1  . escitalopram (LEXAPRO) 10 MG tablet    Sig: Take 1 tablet (10 mg total) by mouth daily.    Dispense:  90 tablet    Refill:  [place], MD [facility] 05/03/2017, 1:53 PM

## 2017-05-04 LAB — LIPID PANEL
Chol/HDL Ratio: 2.7 ratio (ref 0.0–4.4)
Cholesterol, Total: 252 mg/dL — ABNORMAL HIGH (ref 100–199)
HDL: 93 mg/dL (ref >39)
LDL CALC: 106 mg/dL — AB (ref 0–99)
Triglycerides: 266 mg/dL — ABNORMAL HIGH (ref 0–149)
VLDL CHOLESTEROL CAL: 53 mg/dL — AB (ref 5–40)

## 2017-05-04 LAB — CBC WITH DIFFERENTIAL/PLATELET
Basophils Absolute: 0 10*3/uL (ref 0.0–0.2)
Basos: 0 %
EOS (ABSOLUTE): 0.1 10*3/uL (ref 0.0–0.4)
EOS: 1 %
HEMOGLOBIN: 13.7 g/dL (ref 11.1–15.9)
Hematocrit: 41.8 % (ref 34.0–46.6)
IMMATURE GRANS (ABS): 0 10*3/uL (ref 0.0–0.1)
IMMATURE GRANULOCYTES: 1 %
Lymphocytes Absolute: 2.8 10*3/uL (ref 0.7–3.1)
Lymphs: 33 %
MCH: 30.5 pg (ref 26.6–33.0)
MCHC: 32.8 g/dL (ref 31.5–35.7)
MCV: 93 fL (ref 79–97)
MONOCYTES: 10 %
Monocytes Absolute: 0.8 10*3/uL (ref 0.1–0.9)
NEUTROS PCT: 55 %
Neutrophils Absolute: 4.8 10*3/uL (ref 1.4–7.0)
PLATELETS: 224 10*3/uL (ref 150–379)
RBC: 4.49 x10E6/uL (ref 3.77–5.28)
RDW: 14.1 % (ref 12.3–15.4)
WBC: 8.6 10*3/uL (ref 3.4–10.8)

## 2017-05-04 LAB — CMP14+EGFR
ALT: 17 IU/L (ref 0–32)
AST: 17 IU/L (ref 0–40)
Albumin/Globulin Ratio: 1.8 (ref 1.2–2.2)
Albumin: 4.2 g/dL (ref 3.5–5.5)
Alkaline Phosphatase: 83 IU/L (ref 39–117)
BUN/Creatinine Ratio: 14 (ref 9–23)
BUN: 12 mg/dL (ref 6–24)
Bilirubin Total: <0.2 mg/dL (ref 0.0–1.2)
CO2: 25 mmol/L (ref 20–29)
CREATININE: 0.83 mg/dL (ref 0.57–1.00)
Calcium: 9.9 mg/dL (ref 8.7–10.2)
Chloride: 106 mmol/L (ref 96–106)
GFR, EST AFRICAN AMERICAN: 89 mL/min/{1.73_m2} (ref >59)
GFR, EST NON AFRICAN AMERICAN: 77 mL/min/{1.73_m2} (ref >59)
Globulin, Total: 2.3 g/dL (ref 1.5–4.5)
Glucose: 72 mg/dL (ref 65–99)
Potassium: 4.4 mmol/L (ref 3.5–5.2)
Sodium: 145 mmol/L — ABNORMAL HIGH (ref 134–144)
Total Protein: 6.5 g/dL (ref 6.0–8.5)

## 2017-05-10 ENCOUNTER — Telehealth: Payer: Self-pay | Admitting: Family Medicine

## 2017-05-10 NOTE — Telephone Encounter (Signed)
Aware of results. 

## 2017-06-05 ENCOUNTER — Ambulatory Visit: Payer: BLUE CROSS/BLUE SHIELD | Admitting: Pediatrics

## 2017-06-05 VITALS — BP 111/65 | HR 88 | Temp 97.3°F | Resp 18 | Ht 61.0 in | Wt 117.6 lb

## 2017-06-05 DIAGNOSIS — J4521 Mild intermittent asthma with (acute) exacerbation: Secondary | ICD-10-CM | POA: Diagnosis not present

## 2017-06-05 MED ORDER — AZITHROMYCIN 250 MG PO TABS: ORAL_TABLET | ORAL | 0 refills | Status: DC

## 2017-06-05 MED ORDER — ALBUTEROL SULFATE HFA 108 (90 BASE) MCG/ACT IN AERS: 2.0000 | INHALATION_SPRAY | Freq: Four times a day (QID) | RESPIRATORY_TRACT | 0 refills | Status: DC | PRN

## 2017-06-05 MED ORDER — PREDNISONE 20 MG PO TABS: ORAL_TABLET | ORAL | 0 refills | Status: DC

## 2017-06-05 NOTE — Progress Notes (Signed)
  Subjective:   Patient ID: Katie [name], female    DOB: May 20, 1960, 57 y.o.   MRN: [id number] CC: URI (x 3 wks, prod cough, drainage)  HPI: [name] is a 57 y.o. female   URI symptoms started about 3 weeks ago.  Started with a sore throat,   Nasal congestion, coughing.  At first was coughing up a lot of sputum, yellow, green.  About a week ago she was still having some subjective fevers, feeling hot and cold at home.  Being outside in the heat makes her breathing worse.  Now most bothered by dry cough.  Has tried someone else's Advair with some relief in symptoms.  Appetite slightly down.  Prednisone is on her medicine list, not taken it in the last couple months, trying to avoid it when she can.  Has hesitations about being on DMARDs for her rheumatoid arthritis.  Relevant past medical, surgical, family and social history reviewed. Allergies and medications reviewed and updated. Social History   Tobacco Use  Smoking Status Former Smoker  . Last attempt to quit: 10/11/1993  . Years since quitting: 23.6  Smokeless Tobacco Never Used  Tobacco Comment   20 years ago   ROS: Per HPI   Objective:    BP 111/65 (BP Location: Left Arm, Patient Position: Sitting, Cuff Size: Normal)   Pulse 88   Temp (!) 97.3 F (36.3 C) (Oral)   Resp 18   Ht 5\' 1"  (1.549 m)   Wt 117 lb 9.6 oz (53.3 kg)   SpO2 98%   BMI 22.22 kg/m   Wt Readings from Last 3 Encounters:  06/05/17 117 lb 9.6 oz (53.3 kg)  05/03/17 119 lb (54 kg)  11/06/16 115 lb 9.6 oz (52.4 kg)    Gen: NAD, alert, cooperative with exam, NCAT, congested EYES: EOMI, no conjunctival injection, or no icterus ENT:  TMs pearly gray b/l, OP without erythema LYMPH: no cervical LAD CV: NRRR, normal S1/S2, no murmur, distal pulses 2+ b/l Resp: Moving air fair, no crackles, wheezes present bilaterally with forced exhalation, comfortable WOB Abd: +BS, soft, NTND. no guarding or organomegaly Ext: No edema, warm Neuro: Alert and  oriented  Assessment & Plan:  Reather was seen today for uri.  Diagnoses and all orders for this visit:  Mild intermittent asthma with exacerbation Treat with below.  Return precautions discussed.  Will follow-up with PCP to discuss PFTs and further treatment as needed for rheumatoid. -     predniSONE (DELTASONE) 20 MG tablet; 2 po at same time daily for 5 days -     albuterol (PROVENTIL HFA;VENTOLIN HFA) 108 (90 Base) MCG/ACT inhaler; Inhale 2 puffs into the lungs every 6 (six) hours as needed for wheezing or shortness of breath. -     azithromycin (ZITHROMAX) 250 MG tablet; Take 2 the first day and then one each day after.   Follow up plan: Return in about 1 month (around 07/03/2017). Assunta Found, MD [facility]

## 2017-06-07 ENCOUNTER — Telehealth: Payer: Self-pay | Admitting: Family Medicine

## 2017-06-07 MED ORDER — AMOXICILLIN-POT CLAVULANATE 875-125 MG PO TABS: 1.0000 | ORAL_TABLET | Freq: Two times a day (BID) | ORAL | 0 refills | Status: DC

## 2017-06-07 NOTE — Telephone Encounter (Signed)
Change from azithro to augmentin.   Katie Apple, MD [facility] 06/07/2017, 3:45 PM

## 2017-06-07 NOTE — Telephone Encounter (Signed)
Left detailed message on pt's vm

## 2017-06-15 ENCOUNTER — Other Ambulatory Visit: Payer: Self-pay | Admitting: Family Medicine

## 2017-06-15 DIAGNOSIS — J4521 Mild intermittent asthma with (acute) exacerbation: Secondary | ICD-10-CM

## 2017-06-15 MED ORDER — PREDNISONE 20 MG PO TABS: ORAL_TABLET | ORAL | 1 refills | Status: DC

## 2017-07-04 ENCOUNTER — Other Ambulatory Visit: Payer: Self-pay | Admitting: Pediatrics

## 2017-07-04 DIAGNOSIS — J4521 Mild intermittent asthma with (acute) exacerbation: Secondary | ICD-10-CM

## 2017-08-11 ENCOUNTER — Other Ambulatory Visit: Payer: Self-pay | Admitting: Family Medicine

## 2017-08-11 DIAGNOSIS — J4521 Mild intermittent asthma with (acute) exacerbation: Secondary | ICD-10-CM

## 2017-09-08 ENCOUNTER — Other Ambulatory Visit: Payer: Self-pay | Admitting: Family Medicine

## 2017-09-08 DIAGNOSIS — J4521 Mild intermittent asthma with (acute) exacerbation: Secondary | ICD-10-CM

## 2017-09-09 NOTE — Telephone Encounter (Signed)
lmtcb-cb 9/5

## 2018-02-21 ENCOUNTER — Ambulatory Visit (INDEPENDENT_AMBULATORY_CARE_PROVIDER_SITE_OTHER): Payer: BLUE CROSS/BLUE SHIELD | Admitting: Family Medicine

## 2018-02-21 ENCOUNTER — Encounter: Payer: Self-pay | Admitting: Family Medicine

## 2018-02-21 VITALS — BP 126/81 | HR 76 | Temp 97.0°F | Ht 61.0 in | Wt 129.4 lb

## 2018-02-21 DIAGNOSIS — M069 Rheumatoid arthritis, unspecified: Secondary | ICD-10-CM | POA: Diagnosis not present

## 2018-02-21 DIAGNOSIS — F329 Major depressive disorder, single episode, unspecified: Secondary | ICD-10-CM

## 2018-02-21 DIAGNOSIS — Z0001 Encounter for general adult medical examination with abnormal findings: Secondary | ICD-10-CM | POA: Diagnosis not present

## 2018-02-21 DIAGNOSIS — F419 Anxiety disorder, unspecified: Secondary | ICD-10-CM

## 2018-02-21 DIAGNOSIS — Z23 Encounter for immunization: Secondary | ICD-10-CM | POA: Diagnosis not present

## 2018-02-21 DIAGNOSIS — Z0000 Encounter for general adult medical examination without abnormal findings: Secondary | ICD-10-CM

## 2018-02-21 DIAGNOSIS — Z1231 Encounter for screening mammogram for malignant neoplasm of breast: Secondary | ICD-10-CM | POA: Diagnosis not present

## 2018-02-21 DIAGNOSIS — Z1211 Encounter for screening for malignant neoplasm of colon: Secondary | ICD-10-CM

## 2018-02-21 DIAGNOSIS — F32A Depression, unspecified: Secondary | ICD-10-CM

## 2018-02-21 DIAGNOSIS — L819 Disorder of pigmentation, unspecified: Secondary | ICD-10-CM

## 2018-02-21 MED ORDER — CLOTRIMAZOLE-BETAMETHASONE 1-0.05 % EX CREA: 1.0000 "application " | TOPICAL_CREAM | Freq: Two times a day (BID) | CUTANEOUS | 0 refills | Status: DC

## 2018-02-21 MED ORDER — PREDNISONE 10 MG PO TABS: ORAL_TABLET | ORAL | 3 refills | Status: DC

## 2018-02-21 MED ORDER — ESCITALOPRAM OXALATE 20 MG PO TABS: 20.0000 mg | ORAL_TABLET | Freq: Every day | ORAL | 5 refills | Status: DC

## 2018-02-21 MED ORDER — LORAZEPAM 0.5 MG PO TABS: 0.5000 mg | ORAL_TABLET | Freq: Two times a day (BID) | ORAL | 1 refills | Status: DC | PRN

## 2018-02-21 NOTE — Patient Instructions (Addendum)
Health Maintenance After Age 60 After age 39, you are at a higher risk for certain long-term diseases and infections as well as injuries from falls. Falls are a major cause of broken bones and head injuries in people who are older than age 66. Getting regular preventive care can help to keep you healthy and well. Preventive care includes getting regular testing and making lifestyle changes as recommended by your health care provider. Talk with your health care provider about:  Which screenings and tests you should have. A screening is a test that checks for a disease when you have no symptoms.  A diet and exercise plan that is right for you. What should I know about screenings and tests to prevent falls? Screening and testing are the best ways to find a health problem early. Early diagnosis and treatment give you the best chance of managing medical conditions that are common after age 60. Certain conditions and lifestyle choices may make you more likely to have a fall. Your health care provider may recommend:  Regular vision checks. Poor vision and conditions such as cataracts can make you more likely to have a fall. If you wear glasses, make sure to get your prescription updated if your vision changes.  Medicine review. Work with your health care provider to regularly review all of the medicines you are taking, including over-the-counter medicines. Ask your health care provider about any side effects that may make you more likely to have a fall. Tell your health care provider if any medicines that you take make you feel dizzy or sleepy.  Osteoporosis screening. Osteoporosis is a condition that causes the bones to get weaker. This can make the bones weak and cause them to break more easily.  Blood pressure screening. Blood pressure changes and medicines to control blood pressure can make you feel dizzy.  Strength and balance checks. Your health care provider may recommend certain tests to check your  strength and balance while standing, walking, or changing positions.  Foot health exam. Foot pain and numbness, as well as not wearing proper footwear, can make you more likely to have a fall.  Depression screening. You may be more likely to have a fall if you have a fear of falling, feel emotionally low, or feel unable to do activities that you used to do.  Alcohol use screening. Using too much alcohol can affect your balance and may make you more likely to have a fall. What actions can I take to lower my risk of falls? General instructions  Talk with your health care provider about your risks for falling. Tell your health care provider if: ? You fall. Be sure to tell your health care provider about all falls, even ones that seem minor. ? You feel dizzy, sleepy, or off-balance.  Take over-the-counter and prescription medicines only as told by your health care provider. These include any supplements.  Eat a healthy diet and maintain a healthy weight. A healthy diet includes low-fat dairy products, low-fat (lean) meats, and fiber from whole grains, beans, and lots of fruits and vegetables. Home safety  Remove any tripping hazards, such as rugs, cords, and clutter.  Install safety equipment such as grab bars in bathrooms and safety rails on stairs.  Keep rooms and walkways well-lit. Activity   Follow a regular exercise program to stay fit. This will help you maintain your balance. Ask your health care provider what types of exercise are appropriate for you.  If you need a cane or  walker, use it as recommended by your health care provider.  Wear supportive shoes that have nonskid soles. Lifestyle  Do not drink alcohol if your health care provider tells you not to drink.  If you drink alcohol, limit how much you have: ? 0-1 drink a day for women. ? 0-2 drinks a day for men.  Be aware of how much alcohol is in your drink. In the [place] one drink equals one typical bottle of beer (12  oz), one-half glass of wine (5 oz), or one shot of hard liquor (1 oz).  Do not use any products that contain nicotine or tobacco, such as cigarettes and e-cigarettes. If you need help quitting, ask your health care provider. Summary  Having a healthy lifestyle and getting preventive care can help to protect your health and wellness after age 60.  Screening and testing are the best way to find a health problem early and help you avoid having a fall. Early diagnosis and treatment give you the best chance for managing medical conditions that are more common for people who are older than age 60.  Falls are a major cause of broken bones and head injuries in people who are older than age 60. Take precautions to prevent a fall at home.  Work with your health care provider to learn what changes you can make to improve your health and wellness and to prevent falls. This information is not intended to replace advice given to you by your health care provider. Make sure you discuss any questions you have with your health care provider. Document Released: 11/04/2016 Document Revised: 11/04/2016 Document Reviewed: 11/04/2016 Elsevier Interactive Patient Education  2019 [place] Maintenance, Female Adopting a healthy lifestyle and getting preventive care can go a long way to promote health and wellness. Talk with your health care provider about what schedule of regular examinations is right for you. This is a good chance for you to check in with your provider about disease prevention and staying healthy. In between checkups, there are plenty of things you can do on your own. Experts have done a lot of research about which lifestyle changes and preventive measures are most likely to keep you healthy. Ask your health care provider for more information. Weight and diet Eat a healthy diet  Be sure to include plenty of vegetables, fruits, low-fat dairy products, and lean protein.  Do not eat a lot  of foods high in solid fats, added sugars, or salt.  Get regular exercise. This is one of the most important things you can do for your health. ? Most adults should exercise for at least 150 minutes each week. The exercise should increase your heart rate and make you sweat (moderate-intensity exercise). ? Most adults should also do strengthening exercises at least twice a week. This is in addition to the moderate-intensity exercise. Maintain a healthy weight  Body mass index (BMI) is a measurement that can be used to identify possible weight problems. It estimates body fat based on height and weight. Your health care provider can help determine your BMI and help you achieve or maintain a healthy weight.  For females 74 years of age and older: ? A BMI below 18.5 is considered underweight. ? A BMI of 18.5 to 24.9 is normal. ? A BMI of 25 to 29.9 is considered overweight. ? A BMI of 30 and above is considered obese. Watch levels of cholesterol and blood lipids  You should start having your blood tested  for lipids and cholesterol at 60 years of age, then have this test every 5 years.  You may need to have your cholesterol levels checked more often if: ? Your lipid or cholesterol levels are high. ? You are older than 60 years of age. ? You are at high risk for heart disease. Cancer screening Lung Cancer  Lung cancer screening is recommended for adults 69-64 years old who are at high risk for lung cancer because of a history of smoking.  A yearly low-dose CT scan of the lungs is recommended for people who: ? Currently smoke. ? Have quit within the past 15 years. ? Have at least a 30-pack-year history of smoking. A pack year is smoking an average of one pack of cigarettes a day for 1 year.  Yearly screening should continue until it has been 15 years since you quit.  Yearly screening should stop if you develop a health problem that would prevent you from having lung cancer  treatment. Breast Cancer  Practice breast self-awareness. This means understanding how your breasts normally appear and feel.  It also means doing regular breast self-exams. Let your health care provider know about any changes, no matter how small.  If you are in your 20s or 30s, you should have a clinical breast exam (CBE) by a health care provider every 1-3 years as part of a regular health exam.  If you are 64 or older, have a CBE every year. Also consider having a breast X-ray (mammogram) every year.  If you have a family history of breast cancer, talk to your health care provider about genetic screening.  If you are at high risk for breast cancer, talk to your health care provider about having an MRI and a mammogram every year.  Breast cancer gene (BRCA) assessment is recommended for women who have family members with BRCA-related cancers. BRCA-related cancers include: ? Breast. ? Ovarian. ? Tubal. ? Peritoneal cancers.  Results of the assessment will determine the need for genetic counseling and BRCA1 and BRCA2 testing. Cervical Cancer Your health care provider may recommend that you be screened regularly for cancer of the pelvic organs (ovaries, uterus, and vagina). This screening involves a pelvic examination, including checking for microscopic changes to the surface of your cervix (Pap test). You may be encouraged to have this screening done every 3 years, beginning at age 109.  For women ages 46-65, health care providers may recommend pelvic exams and Pap testing every 3 years, or they may recommend the Pap and pelvic exam, combined with testing for human papilloma virus (HPV), every 5 years. Some types of HPV increase your risk of cervical cancer. Testing for HPV may also be done on women of any age with unclear Pap test results.  Other health care providers may not recommend any screening for nonpregnant women who are considered low risk for pelvic cancer and who do not have  symptoms. Ask your health care provider if a screening pelvic exam is right for you.  If you have had past treatment for cervical cancer or a condition that could lead to cancer, you need Pap tests and screening for cancer for at least 20 years after your treatment. If Pap tests have been discontinued, your risk factors (such as having a new sexual partner) need to be reassessed to determine if screening should resume. Some women have medical problems that increase the chance of getting cervical cancer. In these cases, your health care provider may recommend more frequent screening  and Pap tests. Colorectal Cancer  This type of cancer can be detected and often prevented.  Routine colorectal cancer screening usually begins at 60 years of age and continues through 60 years of age.  Your health care provider may recommend screening at an earlier age if you have risk factors for colon cancer.  Your health care provider may also recommend using home test kits to check for hidden blood in the stool.  A small camera at the end of a tube can be used to examine your colon directly (sigmoidoscopy or colonoscopy). This is done to check for the earliest forms of colorectal cancer.  Routine screening usually begins at age 48.  Direct examination of the colon should be repeated every 5-10 years through 61 years of age. However, you may need to be screened more often if early forms of precancerous polyps or small growths are found. Skin Cancer  Check your skin from head to toe regularly.  Tell your health care provider about any new moles or changes in moles, especially if there is a change in a mole's shape or color.  Also tell your health care provider if you have a mole that is larger than the size of a pencil eraser.  Always use sunscreen. Apply sunscreen liberally and repeatedly throughout the day.  Protect yourself by wearing long sleeves, pants, a wide-brimmed hat, and sunglasses whenever you are  outside. Heart disease, diabetes, and high blood pressure  High blood pressure causes heart disease and increases the risk of stroke. High blood pressure is more likely to develop in: ? People who have blood pressure in the high end of the normal range (130-139/85-89 mm Hg). ? People who are overweight or obese. ? People who are African American.  If you are 66-61 years of age, have your blood pressure checked every 3-5 years. If you are 55 years of age or older, have your blood pressure checked every year. You should have your blood pressure measured twice-once when you are at a hospital or clinic, and once when you are not at a hospital or clinic. Record the average of the two measurements. To check your blood pressure when you are not at a hospital or clinic, you can use: ? An automated blood pressure machine at a pharmacy. ? A home blood pressure monitor.  If you are between 33 years and 83 years old, ask your health care provider if you should take aspirin to prevent strokes.  Have regular diabetes screenings. This involves taking a blood sample to check your fasting blood sugar level. ? If you are at a normal weight and have a low risk for diabetes, have this test once every three years after 60 years of age. ? If you are overweight and have a high risk for diabetes, consider being tested at a younger age or more often. Preventing infection Hepatitis B  If you have a higher risk for hepatitis B, you should be screened for this virus. You are considered at high risk for hepatitis B if: ? You were born in a country where hepatitis B is common. Ask your health care provider which countries are considered high risk. ? Your parents were born in a high-risk country, and you have not been immunized against hepatitis B (hepatitis B vaccine). ? You have HIV or AIDS. ? You use needles to inject street drugs. ? You live with someone who has hepatitis B. ? You have had sex with someone who has  hepatitis B. ?  You get hemodialysis treatment. ? You take certain medicines for conditions, including cancer, organ transplantation, and autoimmune conditions. Hepatitis C  Blood testing is recommended for: ? Everyone born from [birth year] through 1967. ? Anyone with known risk factors for hepatitis C. Sexually transmitted infections (STIs)  You should be screened for sexually transmitted infections (STIs) including gonorrhea and chlamydia if: ? You are sexually active and are younger than 60 years of age. ? You are older than 60 years of age and your health care provider tells you that you are at risk for this type of infection. ? Your sexual activity has changed since you were last screened and you are at an increased risk for chlamydia or gonorrhea. Ask your health care provider if you are at risk.  If you do not have HIV, but are at risk, it may be recommended that you take a prescription medicine daily to prevent HIV infection. This is called pre-exposure prophylaxis (PrEP). You are considered at risk if: ? You are sexually active and do not regularly use condoms or know the HIV status of your partner(s). ? You take drugs by injection. ? You are sexually active with a partner who has HIV. Talk with your health care provider about whether you are at high risk of being infected with HIV. If you choose to begin PrEP, you should first be tested for HIV. You should then be tested every 3 months for as long as you are taking PrEP. Pregnancy  If you are premenopausal and you may become pregnant, ask your health care provider about preconception counseling.  If you may become pregnant, take 400 to 800 micrograms (mcg) of folic acid every day.  If you want to prevent pregnancy, talk to your health care provider about birth control (contraception). Osteoporosis and menopause  Osteoporosis is a disease in which the bones lose minerals and strength with aging. This can result in serious bone  fractures. Your risk for osteoporosis can be identified using a bone density scan.  If you are 20 years of age or older, or if you are at risk for osteoporosis and fractures, ask your health care provider if you should be screened.  Ask your health care provider whether you should take a calcium or vitamin D supplement to lower your risk for osteoporosis.  Menopause may have certain physical symptoms and risks.  Hormone replacement therapy may reduce some of these symptoms and risks. Talk to your health care provider about whether hormone replacement therapy is right for you. Follow these instructions at home:  Schedule regular health, dental, and eye exams.  Stay current with your immunizations.  Do not use any tobacco products including cigarettes, chewing tobacco, or electronic cigarettes.  If you are pregnant, do not drink alcohol.  If you are breastfeeding, limit how much and how often you drink alcohol.  Limit alcohol intake to no more than 1 drink per day for nonpregnant women. One drink equals 12 ounces of beer, 5 ounces of wine, or 1 ounces of hard liquor.  Do not use street drugs.  Do not share needles.  Ask your health care provider for help if you need support or information about quitting drugs.  Tell your health care provider if you often feel depressed.  Tell your health care provider if you have ever been abused or do not feel safe at home. This information is not intended to replace advice given to you by your health care provider. Make sure you discuss any questions  you have with your health care provider. Document Released: 07/07/2010 Document Revised: 05/30/2015 Document Reviewed: 09/25/2014 Elsevier Interactive Patient Education  2019 [employer].

## 2018-02-21 NOTE — Progress Notes (Signed)
Subjective:  Patient ID: Katie [name], female    DOB: 1960/03/06, 58 y.o.   MRN: [id number]  Chief Complaint:  Annual Exam (without pap, would like to have labwork; having a lot of problems with anxiety, husband is going through illnesses)   HPI: Katie [name] is a 58 y.o. female presenting on 02/21/2018 for Annual Exam (without pap, would like to have labwork; having a lot of problems with anxiety, husband is going through illnesses)   Pt presents today for her annual physical exam. Pt states she has been under a lot of stress and has noticed a significant increase in her anxiety and depression. States her husband is sick and she has been taking care of him and this has increased her anxiety. She denies SI / HI, but feels she has been trying to manage her anxiety by drinking wine at night. States she has tapered down, but feels she may need to increase her medications. She states she is doing well otherwise.  Mammogram due. Denies breast changes. Colonoscopy due - prefers cologuard. Does not have any first degree relatives with CRC. Has not had an abnormal colonoscopy in the past. Denies rectal bleeding or changes in stool color. No changes in bowel habits.  PAP due at the end of the year. Denies vaginal bleeding, pain, or discharge.   Relevant past medical, surgical, family, and social history reviewed and updated as indicated.  Allergies and medications reviewed and updated.   Past Medical History:  Diagnosis Date  . Arthritis    RA  . Asthma    no attack since childhood  . Blood transfusion   . Contact lens/glasses fitting    wears glasses or contacts  . Cystocele   . Depression   . External hemorrhoids   . Fibromyalgia   . GERD (gastroesophageal reflux disease)    past hx   . Glaucoma    RIGHT EYE  . HPV (human papilloma virus) infection   . Neuromuscular disorder (HCC)    fibromyalgia  . Syncope 2015   Pt questioned seizure with this episode - nothing like  since - vaso vagal response   . Wears dentures    top    Past Surgical History:  Procedure Laterality Date  . ABDOMINAL EXPLORATION SURGERY  1984   bleed after hyst  . APPENDECTOMY  1983  . BLADDER SUSPENSION  2010  . BREAST ENHANCEMENT SURGERY  2000  . [place]   rt  . CARPAL TUNNEL RELEASE Left 12/09/2012   Procedure: LEFT LIMITED OPEN CARPAL TUNNEL RELEASE;  Surgeon: Roseanne Kaufman, MD;  Location: [facility];  Service: Orthopedics;  Laterality: Left;  . COLONOSCOPY    . CYSTOSCOPY    . DIAGNOSTIC LAPAROSCOPY    . MINI SHUNT INSERTION  10/14/2011   Procedure: INSERTION OF MINI SHUNT;  Surgeon: Marylynn Pearson, MD;  Location: [facility];  Service: Ophthalmology;  Laterality: Right;  . POLYPECTOMY    . Tubaligation  1980  . VAGINAL HYSTERECTOMY  1983    Social History   Socioeconomic History  . Marital status: Married    Spouse name: Not on file  . Number of children: 1  . Years of education: Not on file  . Highest education level: Not on file  Occupational History    Employer: [employer]  . Financial resource strain: Not on file  . Food insecurity:    Worry: Not on file  Inability: Not on file  . Transportation needs:    Medical: Not on file    Non-medical: Not on file  Tobacco Use  . Smoking status: Former Smoker    Last attempt to quit: 10/11/1993    Years since quitting: 24.3  . Smokeless tobacco: Never Used  . Tobacco comment: 20 years ago  Substance and Sexual Activity  . Alcohol use: Yes    Comment: Rarely: [place]- cocktail  . Drug use: No  . Sexual activity: Not on file  Lifestyle  . Physical activity:    Days per week: Not on file    Minutes per session: Not on file  . Stress: Not on file  Relationships  . Social connections:    Talks on phone: Not on file    Gets together: Not on file    Attends religious service: Not on file    Active member of club or organization: Not on file    Attends  meetings of clubs or organizations: Not on file    Relationship status: Not on file  . Intimate partner violence:    Fear of current or ex partner: Not on file    Emotionally abused: Not on file    Physically abused: Not on file    Forced sexual activity: Not on file  Other Topics Concern  . Not on file  Social History Narrative  . Not on file    Outpatient Encounter Medications as of 02/21/2018  Medication Sig  . calcium carbonate 1250 MG capsule Take 1,250 mg by mouth 2 (two) times daily with a meal.  . fish oil-omega-3 fatty acids 1000 MG capsule Take 1 g by mouth daily.    . folic acid (FOLVITE) 878 MCG tablet Take 400 mcg by mouth daily.  [name] LORazepam (ATIVAN) 0.5 MG tablet Take 1 tablet (0.5 mg total) by mouth 2 (two) times daily as needed for anxiety.  . Multiple Vitamin (MULTIVITAMIN) tablet Take 1 tablet by mouth daily.    . predniSONE (DELTASONE) 10 MG tablet TAKE 1 TO 4 TABS BY MOUTH DAILY FOR RHEUMATOID ARTHRITIS. Please call office to set up follow up appt, needs to be seen for more refills.  [name] PROAIR HFA 108 (90 Base) MCG/ACT inhaler TAKE 2 PUFFS BY MOUTH EVERY 6 HOURS AS NEEDED FOR WHEEZE OR SHORTNESS OF BREATH  . Probiotic Product (PROBIOTIC DAILY PO) Take by mouth daily.  . [DISCONTINUED] escitalopram (LEXAPRO) 10 MG tablet Take 1 tablet (10 mg total) by mouth daily.  . [DISCONTINUED] LORazepam (ATIVAN) 0.5 MG tablet Take 1 tablet (0.5 mg total) by mouth 2 (two) times daily as needed for anxiety.  . [DISCONTINUED] predniSONE (DELTASONE) 10 MG tablet TAKE 1 TO 4 TABS BY MOUTH DAILY FOR RHEUMATOID ARTHRITIS. Please call office to set up follow up appt, needs to be seen for more refills.  [name] escitalopram (LEXAPRO) 20 MG tablet Take 1 tablet (20 mg total) by mouth daily.  . [DISCONTINUED] amoxicillin-clavulanate (AUGMENTIN) 875-125 MG tablet Take 1 tablet by mouth 2 (two) times daily.  . [DISCONTINUED] azithromycin (ZITHROMAX) 250 MG tablet Take 2 the first day and then one each  day after.  . [DISCONTINUED] cyclobenzaprine (FLEXERIL) 10 MG tablet Take 1 tablet (10 mg total) by mouth 3 (three) times daily as needed for muscle spasms. (Patient not taking: Reported on 06/05/2017)  . [DISCONTINUED] naproxen (NAPROSYN) 500 MG tablet Take 1 tablet (500 mg total) by mouth 2 (two) times daily with a meal. (Patient not taking: Reported on  06/05/2017)  . [DISCONTINUED] oxyCODONE-acetaminophen (PERCOCET/ROXICET) 5-325 MG tablet Take 1 tablet by mouth every 6 (six) hours as needed for severe pain. (Patient not taking: Reported on 06/05/2017)   No facility-administered encounter medications on file as of 02/21/2018.     Allergies  Allergen Reactions  . Codeine Nausea And Vomiting    Review of Systems  Constitutional: Positive for activity change, appetite change and fatigue. Negative for chills, fever and unexpected weight change.  HENT: Negative for trouble swallowing and voice change.   Respiratory: Negative for cough, chest tightness and shortness of breath.   Cardiovascular: Negative for chest pain, palpitations and leg swelling.  Gastrointestinal: Negative for abdominal distention, abdominal pain, anal bleeding, blood in stool, constipation, diarrhea, nausea, rectal pain and vomiting.  Endocrine: Negative for cold intolerance, heat intolerance, polydipsia, polyphagia and polyuria.  Genitourinary: Negative for decreased urine volume, difficulty urinating, pelvic pain, vaginal bleeding, vaginal discharge and vaginal pain.  Musculoskeletal: Positive for arthralgias (RA) and joint swelling (mild, intermittent).  Skin:       Two new lesions to face  Neurological: Negative for dizziness, tremors, seizures, syncope, facial asymmetry, speech difficulty, weakness, light-headedness, numbness and headaches.  Hematological: Does not bruise/bleed easily.  Psychiatric/Behavioral: Positive for agitation, decreased concentration, dysphoric mood and sleep disturbance. Negative for behavioral  problems, confusion, hallucinations, self-injury and suicidal ideas. The patient is nervous/anxious. The patient is not hyperactive.   All other systems reviewed and are negative.       Objective:  BP 126/81   Pulse 76   Temp (!) 97 F (36.1 C) (Oral)   Ht 5' 1"  (1.549 m)   Wt 129 lb 6 oz (58.7 kg)   BMI 24.45 kg/m    Wt Readings from Last 3 Encounters:  02/21/18 129 lb 6 oz (58.7 kg)  06/05/17 117 lb 9.6 oz (53.3 kg)  05/03/17 119 lb (54 kg)    Physical Exam Vitals signs and nursing note reviewed.  Constitutional:      General: She is not in acute distress.    Appearance: Normal appearance. She is well-developed, well-groomed and normal weight. She is not ill-appearing or toxic-appearing.  HENT:     Head: Normocephalic and atraumatic.     Right Ear: Hearing and tympanic membrane normal.     Left Ear: Hearing, tympanic membrane, ear canal and external ear normal.     Ears:     Comments: Dryness and crusting to right ear canal    Nose: Nose normal.     Mouth/Throat:     Lips: Pink.     Mouth: Mucous membranes are moist.     Pharynx: Oropharynx is clear. Uvula midline.  Eyes:     General: Lids are normal.     Extraocular Movements: Extraocular movements intact.     Conjunctiva/sclera: Conjunctivae normal.     Pupils: Pupils are equal, round, and reactive to light.  Neck:     Musculoskeletal: Full passive range of motion without pain, normal range of motion and neck supple.     Thyroid: No thyroid mass, thyromegaly or thyroid tenderness.     Vascular: No carotid bruit or JVD.     Trachea: Trachea and phonation normal.  Cardiovascular:     Rate and Rhythm: Normal rate and regular rhythm.     Chest Wall: PMI is not displaced.     Pulses: Normal pulses.     Heart sounds: Normal heart sounds. No murmur. No friction rub. No gallop.   Pulmonary:  Effort: Pulmonary effort is normal.     Breath sounds: Normal breath sounds.  Chest:     Breasts:        Right: Normal.          Left: Normal.     Comments: Bilateral breast implants  Abdominal:     General: Abdomen is flat. Bowel sounds are normal. There is no distension or abdominal bruit.     Palpations: Abdomen is soft. There is no hepatomegaly or splenomegaly.     Tenderness: There is no abdominal tenderness. There is no right CVA tenderness or left CVA tenderness.  Musculoskeletal:     Right lower leg: No edema.     Left lower leg: No edema.  Lymphadenopathy:     Cervical: No cervical adenopathy.  Skin:    General: Skin is warm and dry.     Capillary Refill: Capillary refill takes less than 2 seconds.     Findings: Lesion present.     Comments: Two small crusting lesions to right cheek / temple, no erythema or drainage  Neurological:     General: No focal deficit present.     Mental Status: She is alert and oriented to person, place, and time.     Cranial Nerves: Cranial nerves are intact.     Sensory: Sensation is intact.     Motor: Motor function is intact.     Coordination: Coordination is intact.     Gait: Gait is intact.     Deep Tendon Reflexes: Reflexes are normal and symmetric.  Psychiatric:        Attention and Perception: Attention and perception normal.        Mood and Affect: Affect normal. Mood is anxious.        Speech: Speech normal.        Behavior: Behavior normal. Behavior is cooperative.        Thought Content: Thought content normal. Thought content does not include homicidal or suicidal ideation. Thought content does not include homicidal or suicidal plan.        Cognition and Memory: Cognition and memory normal.        Judgment: Judgment normal.     Results for orders placed or performed in visit on 05/03/17  CMP14+EGFR  Result Value Ref Range   Glucose 72 65 - 99 mg/dL   BUN 12 6 - 24 mg/dL   Creatinine, Ser 0.83 0.57 - 1.00 mg/dL   GFR calc non Af Amer 77 >59 mL/min/1.73   GFR calc Af Amer 89 >59 mL/min/1.73   BUN/Creatinine Ratio 14 9 - 23   Sodium 145 (H) 134  - 144 mmol/L   Potassium 4.4 3.5 - 5.2 mmol/L   Chloride 106 96 - 106 mmol/L   CO2 25 20 - 29 mmol/L   Calcium 9.9 8.7 - 10.2 mg/dL   Total Protein 6.5 6.0 - 8.5 g/dL   Albumin 4.2 3.5 - 5.5 g/dL   Globulin, Total 2.3 1.5 - 4.5 g/dL   Albumin/Globulin Ratio 1.8 1.2 - 2.2   Bilirubin Total <0.2 0.0 - 1.2 mg/dL   Alkaline Phosphatase 83 39 - 117 IU/L   AST 17 0 - 40 IU/L   ALT 17 0 - 32 IU/L  CBC with Differential/Platelet  Result Value Ref Range   WBC 8.6 3.4 - 10.8 x10E3/uL   RBC 4.49 3.77 - 5.28 x10E6/uL   Hemoglobin 13.7 11.1 - 15.9 g/dL   Hematocrit 41.8 34.0 - 46.6 %   MCV  93 79 - 97 fL   MCH 30.5 26.6 - 33.0 pg   MCHC 32.8 31.5 - 35.7 g/dL   RDW 14.1 12.3 - 15.4 %   Platelets 224 150 - 379 x10E3/uL   Neutrophils 55 Not Estab. %   Lymphs 33 Not Estab. %   Monocytes 10 Not Estab. %   Eos 1 Not Estab. %   Basos 0 Not Estab. %   Neutrophils Absolute 4.8 1.4 - 7.0 x10E3/uL   Lymphocytes Absolute 2.8 0.7 - 3.1 x10E3/uL   Monocytes Absolute 0.8 0.1 - 0.9 x10E3/uL   EOS (ABSOLUTE) 0.1 0.0 - 0.4 x10E3/uL   Basophils Absolute 0.0 0.0 - 0.2 x10E3/uL   Immature Granulocytes 1 Not Estab. %   Immature Grans (Abs) 0.0 0.0 - 0.1 x10E3/uL  Lipid panel  Result Value Ref Range   Cholesterol, Total 252 (H) 100 - 199 mg/dL   Triglycerides 266 (H) 0 - 149 mg/dL   HDL 93 >39 mg/dL   VLDL Cholesterol Cal 53 (H) 5 - 40 mg/dL   LDL Calculated 106 (H) 0 - 99 mg/dL   Chol/HDL Ratio 2.7 0.0 - 4.4 ratio       Pertinent labs & imaging results that were available during my care of the patient were reviewed by me and considered in my medical decision making.  Assessment & Plan:  Katie [name] was seen today for annual exam.  Diagnoses and all orders for this visit:  Annual physical exam Health maintenance discussed. Diet and exercise encouraged.  -     Tdap vaccine greater than or equal to 7yo IM -     Lipid panel -     TSH -     Hepatitis C antibody screen -     HIV antibody -      Mammogram Digital Diagnostic Bilateral; Future -     Cologuard  Anxiety and depression Due to increased depression and anxiety, will increase Lexapro to 20 mg daily. Return in 4 weeks for reevaluation. Report any new or worsening symptoms.  -     escitalopram (LEXAPRO) 20 MG tablet; Take 1 tablet (20 mg total) by mouth daily. -     TSH -     LORazepam (ATIVAN) 0.5 MG tablet; Take 1 tablet (0.5 mg total) by mouth 2 (two) times daily as needed for anxiety.  Rheumatoid arthritis involving both hands, unspecified rheumatoid factor presence ([place]) Well controlled with PRN prednisone. Continue below.  -     predniSONE (DELTASONE) 10 MG tablet; TAKE 1 TO 4 TABS BY MOUTH DAILY FOR RHEUMATOID ARTHRITIS. Please call office to set up follow up appt, needs to be seen for more refills.  Visit for screening mammogram -     Mammogram Digital Diagnostic Bilateral; Future  Colon cancer screening Declined colonoscopy. Prefers cologuard. Not hight risk for CRC. cologuard ordered.  -     Cologuard  Vaccine for diphtheria-tetanus-pertussis, combined -     Tdap vaccine greater than or equal to 7yo IM  Atypical pigmented skin lesion Previous skin cancer removed from face. Two new concerning lesions to right cheek / temple. Referral to dermatology made. -     Ambulatory referral to Dermatology      Continue all other maintenance medications.  Follow up plan: Return in 4 weeks (on 03/21/2018).  Educational handout given for health maintenance   The above assessment and management plan was discussed with the patient. The patient verbalized understanding of and has agreed to the management plan. Patient  is aware to call the clinic if symptoms persist or worsen. Patient is aware when to return to the clinic for a follow-up visit. Patient educated on when it is appropriate to go to the emergency department.   Monia Pouch, FNP-C [facility] [phone]

## 2018-02-21 NOTE — Addendum Note (Signed)
Addended by: Baruch Gouty on: 02/21/2018 01:00 PM   Modules accepted: Orders

## 2018-02-22 LAB — LIPID PANEL
CHOL/HDL RATIO: 3.2 ratio (ref 0.0–4.4)
Cholesterol, Total: 230 mg/dL — ABNORMAL HIGH (ref 100–199)
HDL: 73 mg/dL (ref >39)
LDL CALC: 123 mg/dL — AB (ref 0–99)
TRIGLYCERIDES: 168 mg/dL — AB (ref 0–149)
VLDL Cholesterol Cal: 34 mg/dL (ref 5–40)

## 2018-02-22 LAB — HIV ANTIBODY (ROUTINE TESTING W REFLEX): HIV Screen 4th Generation wRfx: NONREACTIVE

## 2018-02-22 LAB — HEPATITIS C ANTIBODY: Hep C Virus Ab: <0.1 s/co ratio (ref 0.0–0.9)

## 2018-02-22 LAB — TSH: TSH: 1.85 u[IU]/mL (ref 0.450–4.500)

## 2018-03-14 ENCOUNTER — Telehealth: Payer: Self-pay | Admitting: Family Medicine

## 2018-03-16 ENCOUNTER — Other Ambulatory Visit: Payer: Self-pay | Admitting: Family Medicine

## 2018-03-16 DIAGNOSIS — F329 Major depressive disorder, single episode, unspecified: Secondary | ICD-10-CM

## 2018-03-16 DIAGNOSIS — F419 Anxiety disorder, unspecified: Principal | ICD-10-CM

## 2018-03-16 DIAGNOSIS — F32A Depression, unspecified: Secondary | ICD-10-CM

## 2018-03-23 ENCOUNTER — Ambulatory Visit: Payer: BLUE CROSS/BLUE SHIELD | Admitting: Family Medicine

## 2018-05-26 ENCOUNTER — Ambulatory Visit: Payer: BLUE CROSS/BLUE SHIELD | Admitting: Family Medicine

## 2018-05-26 ENCOUNTER — Other Ambulatory Visit: Payer: Self-pay | Admitting: Family Medicine

## 2018-05-26 ENCOUNTER — Other Ambulatory Visit: Payer: Self-pay

## 2018-05-26 DIAGNOSIS — Z1211 Encounter for screening for malignant neoplasm of colon: Secondary | ICD-10-CM

## 2018-05-27 ENCOUNTER — Ambulatory Visit: Payer: BLUE CROSS/BLUE SHIELD | Admitting: Family Medicine

## 2018-05-27 ENCOUNTER — Other Ambulatory Visit: Payer: Self-pay

## 2018-05-27 ENCOUNTER — Encounter: Payer: Self-pay | Admitting: Family Medicine

## 2018-05-27 VITALS — BP 118/70 | HR 90 | Temp 97.9°F | Ht 61.0 in | Wt 130.0 lb

## 2018-05-27 DIAGNOSIS — R42 Dizziness and giddiness: Secondary | ICD-10-CM

## 2018-05-27 DIAGNOSIS — K219 Gastro-esophageal reflux disease without esophagitis: Secondary | ICD-10-CM

## 2018-05-27 DIAGNOSIS — R002 Palpitations: Secondary | ICD-10-CM

## 2018-05-27 DIAGNOSIS — M069 Rheumatoid arthritis, unspecified: Secondary | ICD-10-CM

## 2018-05-27 DIAGNOSIS — F411 Generalized anxiety disorder: Secondary | ICD-10-CM

## 2018-05-27 DIAGNOSIS — R609 Edema, unspecified: Secondary | ICD-10-CM

## 2018-05-27 DIAGNOSIS — F339 Major depressive disorder, recurrent, unspecified: Secondary | ICD-10-CM

## 2018-05-27 DIAGNOSIS — R0602 Shortness of breath: Secondary | ICD-10-CM | POA: Diagnosis not present

## 2018-05-27 MED ORDER — OMEPRAZOLE 20 MG PO CPDR: 20.0000 mg | DELAYED_RELEASE_CAPSULE | Freq: Two times a day (BID) | ORAL | 3 refills | Status: DC

## 2018-05-27 MED ORDER — FLUOXETINE HCL 10 MG PO CAPS: 10.0000 mg | ORAL_CAPSULE | Freq: Every day | ORAL | 3 refills | Status: DC

## 2018-05-27 NOTE — Patient Instructions (Signed)

## 2018-05-27 NOTE — Progress Notes (Signed)
Subjective:  Patient ID: Katie [name], female    DOB: Feb 15, 1960, 58 y.o.   MRN: [id number]  Chief Complaint:  Weight Gain and Joint Swelling   HPI: Katie [name] is a 58 y.o. female presenting on 05/27/2018 for Weight Gain and Joint Swelling  Pt presents today with complaints of exertional shortness of breath, palpitations, intermittent dizziness, swelling and bloating, and increased acid reflux. States she has taken herself off of Lexapro and Prednisone. States she feels the swelling of joints is due to stopping the prednisone. States she feels bloated all of the time. States she is not sure if the other symptoms are coming from stopping the Lexapro. She states she is anxious and feels her anxiety and depression have worsened since stopping her Lexapro. She denies chest pain, weakness, focal deficits, orthopnea, or resting shortness of breath. She states the dizziness is intermittent and worse when she stands up. She denies abnormal bleeding or bruising. No vaginal or rectal bleeding. No hematuria. No excessive caffeine use. She reports she has had an increase in acid reflux, states her over the counter meds are not controlling her symptoms. States this is worse with spicy and greasy foods. States she has water brash with the reflux. No sore throat, dysphagia, voice changes, or hemoptysis.   GAD 7 : Generalized Anxiety Score 05/27/2018 02/21/2018 05/04/2016  Nervous, Anxious, on Edge 2 3 3   Control/stop worrying 2 3 3   Worry too much - different things 2 3 3   Trouble relaxing 1 3 3   Restless 0 0 3  Easily annoyed or irritable 1 1 2   Afraid - awful might happen 1 3 3   Total GAD 7 Score 9 16 20   Anxiety Difficulty - Somewhat difficult Very difficult    Depression screen [facility] 2/9 05/27/2018 02/21/2018 05/03/2017 11/06/2016 07/15/2016  Decreased Interest 1 3 0 1 2  Down, Depressed, Hopeless 2 2 0 1 2  PHQ - 2 Score 3 5 0 2 4  Altered sleeping 2 2 - 1 2  Tired, decreased energy 3 3 - 2 2   Change in appetite 0 2 - 0 2  Feeling bad or failure about yourself  0 0 - 0 1  Trouble concentrating 2 3 - 2 2  Moving slowly or fidgety/restless 0 2 - 0 0  Suicidal thoughts 1 0 - 0 0  PHQ-9 Score 11 17 - 7 13  Difficult doing work/chores - - - - -     Relevant past medical, surgical, family, and social history reviewed and updated as indicated.  Allergies and medications reviewed and updated.   Past Medical History:  Diagnosis Date  . Arthritis    RA  . Asthma    no attack since childhood  . Blood transfusion   . Contact lens/glasses fitting    wears glasses or contacts  . Cystocele   . Depression   . External hemorrhoids   . Fibromyalgia   . GERD (gastroesophageal reflux disease)    past hx   . Glaucoma    RIGHT EYE  . HPV (human papilloma virus) infection   . Neuromuscular disorder (HCC)    fibromyalgia  . Syncope 2015   Pt questioned seizure with this episode - nothing like since - vaso vagal response   . Wears dentures    top    Past Surgical History:  Procedure Laterality Date  . ABDOMINAL EXPLORATION SURGERY  1984   bleed after hyst  . APPENDECTOMY  1983  .  BLADDER SUSPENSION  2010  . BREAST ENHANCEMENT SURGERY  2000  . [place]   rt  . CARPAL TUNNEL RELEASE Left 12/09/2012   Procedure: LEFT LIMITED OPEN CARPAL TUNNEL RELEASE;  Surgeon: Roseanne Kaufman, MD;  Location: [place];  Service: Orthopedics;  Laterality: Left;  . COLONOSCOPY    . CYSTOSCOPY    . DIAGNOSTIC LAPAROSCOPY    . MINI SHUNT INSERTION  10/14/2011   Procedure: INSERTION OF MINI SHUNT;  Surgeon: Marylynn Pearson, MD;  Location: [place];  Service: Ophthalmology;  Laterality: Right;  . POLYPECTOMY    . Tubaligation  1980  . VAGINAL HYSTERECTOMY  1983    Social History   Socioeconomic History  . Marital status: Married    Spouse name: Not on file  . Number of children: 1  . Years of education: Not on file  . Highest education level: Not on file   Occupational History    Employer: [employer]  . Financial resource strain: Not on file  . Food insecurity:    Worry: Not on file    Inability: Not on file  . Transportation needs:    Medical: Not on file    Non-medical: Not on file  Tobacco Use  . Smoking status: Former Smoker    Last attempt to quit: 10/11/1993    Years since quitting: 24.6  . Smokeless tobacco: Never Used  . Tobacco comment: 20 years ago  Substance and Sexual Activity  . Alcohol use: Yes    Comment: Rarely: Daquari- cocktail  . Drug use: No  . Sexual activity: Not on file  Lifestyle  . Physical activity:    Days per week: Not on file    Minutes per session: Not on file  . Stress: Not on file  Relationships  . Social connections:    Talks on phone: Not on file    Gets together: Not on file    Attends religious service: Not on file    Active member of club or organization: Not on file    Attends meetings of clubs or organizations: Not on file    Relationship status: Not on file  . Intimate partner violence:    Fear of current or ex partner: Not on file    Emotionally abused: Not on file    Physically abused: Not on file    Forced sexual activity: Not on file  Other Topics Concern  . Not on file  Social History Narrative  . Not on file    Outpatient Encounter Medications as of 05/27/2018  Medication Sig  . calcium carbonate 1250 MG capsule Take 1,250 mg by mouth 2 (two) times daily with a meal.  . folic acid (FOLVITE) 588 MCG tablet Take 400 mcg by mouth daily.  [name] LORazepam (ATIVAN) 0.5 MG tablet Take 1 tablet (0.5 mg total) by mouth 2 (two) times daily as needed for anxiety.  . Multiple Vitamin (MULTIVITAMIN) tablet Take 1 tablet by mouth daily.    [name] PROAIR HFA 108 (90 Base) MCG/ACT inhaler TAKE 2 PUFFS BY MOUTH EVERY 6 HOURS AS NEEDED FOR WHEEZE OR SHORTNESS OF BREATH  . Probiotic Product (PROBIOTIC DAILY PO) Take by mouth daily.  . clotrimazole-betamethasone (LOTRISONE)  cream Apply 1 application topically 2 (two) times daily. (Patient not taking: Reported on 05/27/2018)  . escitalopram (LEXAPRO) 20 MG tablet TAKE 1 TABLET BY MOUTH EVERY DAY (Patient not taking: Reported on 05/27/2018)  . FLUoxetine (PROZAC) 10 MG  capsule Take 1 capsule (10 mg total) by mouth daily for 30 days.  [name] omeprazole (PRILOSEC) 20 MG capsule Take 1 capsule (20 mg total) by mouth 2 (two) times daily before a meal for 30 days.  . predniSONE (DELTASONE) 10 MG tablet TAKE 1 TO 4 TABS BY MOUTH DAILY FOR RHEUMATOID ARTHRITIS. Please call office to set up follow up appt, needs to be seen for more refills. (Patient not taking: Reported on 05/27/2018)  . [DISCONTINUED] fish oil-omega-3 fatty acids 1000 MG capsule Take 1 g by mouth daily.     No facility-administered encounter medications on file as of 05/27/2018.     Allergies  Allergen Reactions  . Codeine Nausea And Vomiting    Review of Systems  Constitutional: Positive for fatigue. Negative for activity change, appetite change, chills, diaphoresis, fever and unexpected weight change.  HENT: Negative for sore throat, trouble swallowing and voice change.   Eyes: Negative for photophobia and visual disturbance.  Respiratory: Positive for cough (dry) and shortness of breath (exertional). Negative for apnea, choking, chest tightness, wheezing and stridor.   Cardiovascular: Positive for palpitations. Negative for chest pain and leg swelling.  Gastrointestinal: Negative for abdominal distention, abdominal pain, anal bleeding, blood in stool, constipation, diarrhea, nausea, rectal pain and vomiting.  Genitourinary: Negative for decreased urine volume, difficulty urinating, hematuria and vaginal bleeding.  Musculoskeletal: Positive for arthralgias and joint swelling.  Skin: Negative for color change and pallor.  Neurological: Positive for dizziness. Negative for tremors, seizures, syncope, facial asymmetry, speech difficulty, weakness,  light-headedness, numbness and headaches.  Hematological: Does not bruise/bleed easily.  Psychiatric/Behavioral: Positive for dysphoric mood and sleep disturbance. Negative for agitation, behavioral problems, confusion, decreased concentration, hallucinations, self-injury and suicidal ideas. The patient is nervous/anxious. The patient is not hyperactive.   All other systems reviewed and are negative.       Objective:  BP 118/70   Pulse 90   Temp 97.9 F (36.6 C) (Oral)   Ht 5' 1"  (1.549 m)   Wt 130 lb (59 kg)   SpO2 96%   BMI 24.56 kg/m    Wt Readings from Last 3 Encounters:  05/27/18 130 lb (59 kg)  02/21/18 129 lb 6 oz (58.7 kg)  06/05/17 117 lb 9.6 oz (53.3 kg)    Physical Exam Vitals signs and nursing note reviewed.  Constitutional:      General: She is not in acute distress.    Appearance: Normal appearance. She is well-developed, well-groomed and normal weight. She is not ill-appearing or toxic-appearing.  HENT:     Head: Normocephalic and atraumatic.     Right Ear: Hearing, tympanic membrane, ear canal and external ear normal.     Left Ear: Hearing, tympanic membrane, ear canal and external ear normal.     Nose: Nose normal.     Mouth/Throat:     Lips: Pink.     Mouth: Mucous membranes are moist.     Pharynx: Oropharynx is clear.  Eyes:     General: Lids are normal. No scleral icterus.    Extraocular Movements: Extraocular movements intact.     Conjunctiva/sclera: Conjunctivae normal.     Pupils: Pupils are equal, round, and reactive to light.  Neck:     Musculoskeletal: Normal range of motion and neck supple.     Thyroid: No thyroid mass, thyromegaly or thyroid tenderness.     Vascular: No carotid bruit or JVD.     Trachea: Trachea and phonation normal.  Cardiovascular:  Rate and Rhythm: Normal rate and regular rhythm.     Chest Wall: PMI is not displaced.     Pulses: Normal pulses.     Heart sounds: Normal heart sounds. No murmur. No friction rub. No  gallop. No S3 sounds.   Pulmonary:     Effort: Pulmonary effort is normal. No respiratory distress.     Breath sounds: Normal breath sounds. No rhonchi or rales.  Abdominal:     General: Bowel sounds are normal. There is no distension.     Palpations: Abdomen is soft.     Tenderness: There is no abdominal tenderness. There is no right CVA tenderness or left CVA tenderness.  Musculoskeletal: Normal range of motion.        General: Swelling (hands and knees) present.     Right lower leg: No edema.     Left lower leg: No edema.  Skin:    General: Skin is warm and dry.     Capillary Refill: Capillary refill takes less than 2 seconds.     Coloration: Skin is not pale.     Findings: No bruising.  Neurological:     General: No focal deficit present.     Mental Status: She is alert and oriented to person, place, and time.     Cranial Nerves: No cranial nerve deficit.     Sensory: No sensory deficit.     Motor: No weakness.     Coordination: Coordination normal.     Gait: Gait normal.     Deep Tendon Reflexes: Reflexes normal.  Psychiatric:        Attention and Perception: Attention and perception normal.        Mood and Affect: Mood is anxious.        Speech: Speech normal.        Behavior: Behavior normal. Behavior is cooperative.        Thought Content: Thought content normal.        Cognition and Memory: Cognition and memory normal.        Judgment: Judgment normal.     Results for orders placed or performed in visit on 02/21/18  Lipid panel  Result Value Ref Range   Cholesterol, Total 230 (H) 100 - 199 mg/dL   Triglycerides 168 (H) 0 - 149 mg/dL   HDL 73 >39 mg/dL   VLDL Cholesterol Cal 34 5 - 40 mg/dL   LDL Calculated 123 (H) 0 - 99 mg/dL   Chol/HDL Ratio 3.2 0.0 - 4.4 ratio  TSH  Result Value Ref Range   TSH 1.850 0.450 - 4.500 uIU/mL  Hepatitis C antibody screen  Result Value Ref Range   Hep C Virus Ab <0.1 0.0 - 0.9 s/co ratio  HIV antibody  Result Value Ref Range    HIV Screen 4th Generation wRfx Non Reactive Non Reactive     EKG - NSR without ST elevation or ectopy. No changes from previous EKG. Monia Pouch, FNP-C  Pertinent labs & imaging results that were available during my care of the patient were reviewed by me and considered in my medical decision making.  Assessment & Plan:  Katie [name] was seen today for weight gain and joint swelling.  Diagnoses and all orders for this visit:  Exertional shortness of breath Palpitations Dizziness EKG NSR without ST changes or ectopy. No changes from previous EKG. Labs pending. Symptoms likely related to stopping Lexapro and increasing anxiety. May need referral to cardiology if symptoms persist.  -  CMP14+EGFR -     CBC with Differential/Platelet -     TSH -     Brain natriuretic peptide   Swelling Likely due to discontinuing prednisone for RA. No pitting edema, rhonchi, rales, S3 heart sound, or murmurs. Labs pending. Pt to make follow up appointment with rheumatology.  -     CMP14+EGFR -     CBC with Differential/Platelet -     TSH -     Brain natriuretic peptide   Depression, recurrent (HCC) Pt willing to trial fluoxetine for increasing anxiety and depression. Medications as prescribed. Follow up in 2 weeks.  -     FLUoxetine (PROZAC) 10 MG capsule; Take 1 capsule (10 mg total) by mouth daily for 30 days.  GAD (generalized anxiety disorder) Pt willing to trial fluoxetine for increasing anxiety and depression. Medications as prescribed. Follow up in 2 weeks.  -     FLUoxetine (PROZAC) 10 MG capsule; Take 1 capsule (10 mg total) by mouth daily for 30 days.  Gastroesophageal reflux disease without esophagitis Avoid spicy, greasy, and fried foods. Avoid caffeine, alcohol, and tobacco. Medications as prescribed. Report any new or worsening symptoms.  -     omeprazole (PRILOSEC) 20 MG capsule; Take 1 capsule (20 mg total) by mouth 2 (two) times daily before a meal for 30 days.      Continue all other maintenance medications.  Follow up plan: Return in about 2 weeks (around 06/10/2018), or if symptoms worsen or fail to improve, for Anxiety, Depression, GERD.  Educational handout given for anxiety  The above assessment and management plan was discussed with the patient. The patient verbalized understanding of and has agreed to the management plan. Patient is aware to call the clinic if symptoms persist or worsen. Patient is aware when to return to the clinic for a follow-up visit. Patient educated on when it is appropriate to go to the emergency department.   Monia Pouch, FNP-C [facility] [phone]

## 2018-05-28 LAB — TSH: TSH: 3.81 u[IU]/mL (ref 0.450–4.500)

## 2018-05-28 LAB — CMP14+EGFR
ALT: 21 IU/L (ref 0–32)
AST: 26 IU/L (ref 0–40)
Albumin/Globulin Ratio: 2.1 (ref 1.2–2.2)
Albumin: 4.6 g/dL (ref 3.8–4.9)
Alkaline Phosphatase: 85 IU/L (ref 39–117)
BUN/Creatinine Ratio: 20 (ref 12–28)
BUN: 17 mg/dL (ref 8–27)
Bilirubin Total: 0.3 mg/dL (ref 0.0–1.2)
CO2: 25 mmol/L (ref 20–29)
Calcium: 10.2 mg/dL (ref 8.7–10.3)
Chloride: 104 mmol/L (ref 96–106)
Creatinine, Ser: 0.83 mg/dL (ref 0.57–1.00)
GFR calc Af Amer: 89 mL/min/{1.73_m2} (ref >59)
GFR calc non Af Amer: 77 mL/min/{1.73_m2} (ref >59)
Globulin, Total: 2.2 g/dL (ref 1.5–4.5)
Glucose: 88 mg/dL (ref 65–99)
Potassium: 4.1 mmol/L (ref 3.5–5.2)
Sodium: 144 mmol/L (ref 134–144)
Total Protein: 6.8 g/dL (ref 6.0–8.5)

## 2018-05-28 LAB — CBC WITH DIFFERENTIAL/PLATELET
Basophils Absolute: 0 10*3/uL (ref 0.0–0.2)
Basos: 1 %
EOS (ABSOLUTE): 0.1 10*3/uL (ref 0.0–0.4)
Eos: 1 %
Hematocrit: 39.3 % (ref 34.0–46.6)
Hemoglobin: 13.8 g/dL (ref 11.1–15.9)
Immature Grans (Abs): 0 10*3/uL (ref 0.0–0.1)
Immature Granulocytes: 0 %
Lymphocytes Absolute: 1.7 10*3/uL (ref 0.7–3.1)
Lymphs: 30 %
MCH: 31.8 pg (ref 26.6–33.0)
MCHC: 35.1 g/dL (ref 31.5–35.7)
MCV: 91 fL (ref 79–97)
Monocytes Absolute: 0.6 10*3/uL (ref 0.1–0.9)
Monocytes: 10 %
Neutrophils Absolute: 3.4 10*3/uL (ref 1.4–7.0)
Neutrophils: 58 %
Platelets: 198 10*3/uL (ref 150–450)
RBC: 4.34 x10E6/uL (ref 3.77–5.28)
RDW: 13.1 % (ref 11.7–15.4)
WBC: 5.9 10*3/uL (ref 3.4–10.8)

## 2018-05-28 LAB — BRAIN NATRIURETIC PEPTIDE: BNP: 6.8 pg/mL (ref 0.0–100.0)

## 2018-06-02 ENCOUNTER — Other Ambulatory Visit: Payer: Self-pay | Admitting: Family Medicine

## 2018-06-02 ENCOUNTER — Telehealth: Payer: Self-pay | Admitting: Family Medicine

## 2018-06-02 DIAGNOSIS — Z1211 Encounter for screening for malignant neoplasm of colon: Secondary | ICD-10-CM

## 2018-06-02 DIAGNOSIS — Z1212 Encounter for screening for malignant neoplasm of rectum: Secondary | ICD-10-CM

## 2018-06-02 DIAGNOSIS — R6882 Decreased libido: Secondary | ICD-10-CM | POA: Insufficient documentation

## 2018-06-02 MED ORDER — BUPROPION HCL ER (XL) 150 MG PO TB24: 150.0000 mg | ORAL_TABLET | Freq: Every day | ORAL | 0 refills | Status: DC

## 2018-06-02 NOTE — Telephone Encounter (Signed)
The order has been placed for the cologuard. Will you make sure the information is entered into the computer so she will receive her test. I need to see her in 1 month after initiation of medication.

## 2018-06-02 NOTE — Telephone Encounter (Signed)
I will replace the order for the cologuard. I will send in a add on medication to help with sexual desire. I will have to check on the shingles vaccine.

## 2018-06-02 NOTE — Telephone Encounter (Signed)
Advise pleas.

## 2018-06-02 NOTE — Telephone Encounter (Signed)
Left message to please call our office. 

## 2018-06-08 ENCOUNTER — Other Ambulatory Visit: Payer: Self-pay | Admitting: Family Medicine

## 2018-06-08 DIAGNOSIS — K219 Gastro-esophageal reflux disease without esophagitis: Secondary | ICD-10-CM

## 2018-06-10 ENCOUNTER — Encounter: Payer: Self-pay | Admitting: Family Medicine

## 2018-06-10 ENCOUNTER — Other Ambulatory Visit: Payer: Self-pay

## 2018-06-10 ENCOUNTER — Ambulatory Visit (INDEPENDENT_AMBULATORY_CARE_PROVIDER_SITE_OTHER): Payer: BC Managed Care – PPO | Admitting: Family Medicine

## 2018-06-10 DIAGNOSIS — F339 Major depressive disorder, recurrent, unspecified: Secondary | ICD-10-CM | POA: Diagnosis not present

## 2018-06-10 DIAGNOSIS — F411 Generalized anxiety disorder: Secondary | ICD-10-CM

## 2018-06-10 NOTE — Progress Notes (Signed)
Virtual Visit via telephone Note Due to COVID-19, visit is conducted virtually and was requested by patient. This visit type was conducted due to national recommendations for restrictions regarding the COVID-19 Pandemic (e.g. social distancing) in an effort to limit this patient's exposure and mitigate transmission in our community. All issues noted in this document were discussed and addressed.  A physical exam was not performed with this format.   I connected with Katie [name] on 06/10/18 at 1255 by telephone and verified that I am speaking with the correct person using two identifiers. Katie [name] is currently located at home and family is currently with them during visit. The provider, Monia Pouch, FNP is located in their office at time of visit.  I discussed the limitations, risks, security and privacy concerns of performing an evaluation and management service by telephone and the availability of in person appointments. I also discussed with the patient that there may be a patient responsible charge related to this service. The patient expressed understanding and agreed to proceed.  Subjective:  Patient ID: Katie [name], female    DOB: 1960/08/31, 58 y.o.   MRN: [id number]  Chief Complaint:  Anxiety   HPI: Katie [name] is a 58 y.o. female presenting on 06/10/2018 for Anxiety   Pt presents today for follow up of anxiety, depression, decreased libido, and shortness of breath. Pt states she has not initiated any of the medications that were called in. States she has been using stress management techniques and working out on a regular basis. States this has helped her tremendously. States she no longer has shortness of breath. States she feels more like herself and is enjoying being intimate again. She states she is no longer home schooling her grandchildren and this has helped a lot.   Anxiety  Presents for follow-up visit. Symptoms include irritability and  nervous/anxious behavior. Patient reports no chest pain, compulsions, confusion, decreased concentration, depressed mood, dizziness, dry mouth, excessive worry, feeling of choking, hyperventilation, impotence, insomnia, malaise, muscle tension, nausea, obsessions, palpitations, panic, restlessness, shortness of breath or suicidal ideas. Symptoms occur rarely. The severity of symptoms is mild. The quality of sleep is good. Nighttime awakenings: none.     Depression screen [facility] 2/9 06/10/2018 05/27/2018 02/21/2018 05/03/2017 11/06/2016  Decreased Interest 0 1 3 0 1  Down, Depressed, Hopeless 0 2 2 0 1  PHQ - 2 Score 0 3 5 0 2  Altered sleeping 0 2 2 - 1  Tired, decreased energy 1 3 3  - 2  Change in appetite 0 0 2 - 0  Feeling bad or failure about yourself  0 0 0 - 0  Trouble concentrating 0 2 3 - 2  Moving slowly or fidgety/restless 0 0 2 - 0  Suicidal thoughts 0 1 0 - 0  PHQ-9 Score 1 11 17  - 7  Difficult doing work/chores - - - - -   GAD 7 : Generalized Anxiety Score 06/10/2018 05/27/2018 02/21/2018 05/04/2016  Nervous, Anxious, on Edge 0 2 3 3   Control/stop worrying 1 2 3 3   Worry too much - different things 1 2 3 3   Trouble relaxing 0 1 3 3   Restless 0 0 0 3  Easily annoyed or irritable 1 1 1 2   Afraid - awful might happen 0 1 3 3   Total GAD 7 Score 3 9 16 20   Anxiety Difficulty - - Somewhat difficult Very difficult       Relevant past medical, surgical, family, and  social history reviewed and updated as indicated.  Allergies and medications reviewed and updated.   Past Medical History:  Diagnosis Date  . Arthritis    RA  . Asthma    no attack since childhood  . Blood transfusion   . Contact lens/glasses fitting    wears glasses or contacts  . Cystocele   . Depression   . External hemorrhoids   . Fibromyalgia   . GERD (gastroesophageal reflux disease)    past hx   . Glaucoma    RIGHT EYE  . HPV (human papilloma virus) infection   . Neuromuscular disorder (HCC)     fibromyalgia  . Syncope 2015   Pt questioned seizure with this episode - nothing like since - vaso vagal response   . Wears dentures    top    Past Surgical History:  Procedure Laterality Date  . ABDOMINAL EXPLORATION SURGERY  1984   bleed after hyst  . APPENDECTOMY  1983  . BLADDER SUSPENSION  2010  . BREAST ENHANCEMENT SURGERY  2000  . [place]   rt  . CARPAL TUNNEL RELEASE Left 12/09/2012   Procedure: LEFT LIMITED OPEN CARPAL TUNNEL RELEASE;  Surgeon: Roseanne Kaufman, MD;  Location: [facility];  Service: Orthopedics;  Laterality: Left;  . COLONOSCOPY    . CYSTOSCOPY    . DIAGNOSTIC LAPAROSCOPY    . MINI SHUNT INSERTION  10/14/2011   Procedure: INSERTION OF MINI SHUNT;  Surgeon: Marylynn Pearson, MD;  Location: [facility];  Service: Ophthalmology;  Laterality: Right;  . POLYPECTOMY    . Tubaligation  1980  . VAGINAL HYSTERECTOMY  1983    Social History   Socioeconomic History  . Marital status: Married    Spouse name: Not on file  . Number of children: 1  . Years of education: Not on file  . Highest education level: Not on file  Occupational History    Employer: [employer]  . Financial resource strain: Not on file  . Food insecurity:    Worry: Not on file    Inability: Not on file  . Transportation needs:    Medical: Not on file    Non-medical: Not on file  Tobacco Use  . Smoking status: Former Smoker    Last attempt to quit: 10/11/1993    Years since quitting: 24.6  . Smokeless tobacco: Never Used  . Tobacco comment: 20 years ago  Substance and Sexual Activity  . Alcohol use: Yes    Comment: Rarely: Daquari- cocktail  . Drug use: No  . Sexual activity: Not on file  Lifestyle  . Physical activity:    Days per week: Not on file    Minutes per session: Not on file  . Stress: Not on file  Relationships  . Social connections:    Talks on phone: Not on file    Gets together: Not on file    Attends religious  service: Not on file    Active member of club or organization: Not on file    Attends meetings of clubs or organizations: Not on file    Relationship status: Not on file  . Intimate partner violence:    Fear of current or ex partner: Not on file    Emotionally abused: Not on file    Physically abused: Not on file    Forced sexual activity: Not on file  Other Topics Concern  . Not on file  Social History Narrative  .  Not on file    Outpatient Encounter Medications as of 06/10/2018  Medication Sig  . calcium carbonate 1250 MG capsule Take 1,250 mg by mouth 2 (two) times daily with a meal.  . clotrimazole-betamethasone (LOTRISONE) cream Apply 1 application topically 2 (two) times daily. (Patient not taking: Reported on 05/27/2018)  . folic acid (FOLVITE) 626 MCG tablet Take 400 mcg by mouth daily.  [name] LORazepam (ATIVAN) 0.5 MG tablet Take 1 tablet (0.5 mg total) by mouth 2 (two) times daily as needed for anxiety.  . Multiple Vitamin (MULTIVITAMIN) tablet Take 1 tablet by mouth daily.    [name] omeprazole (PRILOSEC) 20 MG capsule TAKE 1 CAPSULE (20 MG TOTAL) BY MOUTH 2 (TWO) TIMES DAILY BEFORE A MEAL FOR 30 DAYS.  [name] predniSONE (DELTASONE) 10 MG tablet TAKE 1 TO 4 TABS BY MOUTH DAILY FOR RHEUMATOID ARTHRITIS. Please call office to set up follow up appt, needs to be seen for more refills. (Patient not taking: Reported on 05/27/2018)  . PROAIR HFA 108 (90 Base) MCG/ACT inhaler TAKE 2 PUFFS BY MOUTH EVERY 6 HOURS AS NEEDED FOR WHEEZE OR SHORTNESS OF BREATH  . Probiotic Product (PROBIOTIC DAILY PO) Take by mouth daily.  . [DISCONTINUED] buPROPion (WELLBUTRIN XL) 150 MG 24 hr tablet Take 1 tablet (150 mg total) by mouth daily.  . [DISCONTINUED] escitalopram (LEXAPRO) 20 MG tablet TAKE 1 TABLET BY MOUTH EVERY DAY (Patient not taking: Reported on 05/27/2018)  . [DISCONTINUED] FLUoxetine (PROZAC) 10 MG capsule Take 1 capsule (10 mg total) by mouth daily for 30 days.   No facility-administered encounter  medications on file as of 06/10/2018.     Allergies  Allergen Reactions  . Codeine Nausea And Vomiting    Review of Systems  Constitutional: Positive for irritability. Negative for chills, fatigue, fever and unexpected weight change.  Eyes: Negative for photophobia and visual disturbance.  Respiratory: Negative for cough, chest tightness and shortness of breath.   Cardiovascular: Negative for chest pain, palpitations and leg swelling.  Gastrointestinal: Negative for nausea.  Genitourinary: Negative for impotence.  Neurological: Negative for dizziness, tremors, weakness, numbness and headaches.  Psychiatric/Behavioral: Negative for agitation, behavioral problems, confusion, decreased concentration, dysphoric mood, hallucinations, self-injury, sleep disturbance and suicidal ideas. The patient is nervous/anxious. The patient does not have insomnia and is not hyperactive.   All other systems reviewed and are negative.        Observations/Objective: No vital signs or physical exam, this was a telephone or virtual health encounter.  Pt alert and oriented, answers all questions appropriately, and able to speak in full sentences.    Assessment and Plan: Sissy was seen today for anxiety.  Diagnoses and all orders for this visit:  GAD (generalized anxiety disorder) Depression, recurrent ([place]) Pt is not taking medications. States the never started them. States she is no longer home schooling her grandchildren and is exercising on a regular basis and this has helped a lot. States if her symptoms worsen or return she will follow up.     Follow Up Instructions: Return in about 3 months (around 09/10/2018), or if symptoms worsen or fail to improve, for GAD.    I discussed the assessment and treatment plan with the patient. The patient was provided an opportunity to ask questions and all were answered. The patient agreed with the plan and demonstrated an understanding of the instructions.    The patient was advised to call back or seek an in-person evaluation if the symptoms worsen or if the condition fails  to improve as anticipated.  The above assessment and management plan was discussed with the patient. The patient verbalized understanding of and has agreed to the management plan. Patient is aware to call the clinic if symptoms persist or worsen. Patient is aware when to return to the clinic for a follow-up visit. Patient educated on when it is appropriate to go to the emergency department.    I provided 15 minutes of non-face-to-face time during this encounter. The call started at 1255. The call ended at 1310. The other time was used for coordination of care.    Monia Pouch, FNP-C [facility] [address] [place], [place] [address] [phone]

## 2018-06-12 ENCOUNTER — Other Ambulatory Visit: Payer: Self-pay | Admitting: Family Medicine

## 2018-06-12 DIAGNOSIS — M069 Rheumatoid arthritis, unspecified: Secondary | ICD-10-CM

## 2018-07-05 ENCOUNTER — Other Ambulatory Visit: Payer: Self-pay | Admitting: Family Medicine

## 2018-07-05 DIAGNOSIS — K219 Gastro-esophageal reflux disease without esophagitis: Secondary | ICD-10-CM

## 2018-09-06 ENCOUNTER — Other Ambulatory Visit: Payer: Self-pay | Admitting: Family Medicine

## 2018-09-06 DIAGNOSIS — K219 Gastro-esophageal reflux disease without esophagitis: Secondary | ICD-10-CM

## 2018-09-10 ENCOUNTER — Other Ambulatory Visit: Payer: Self-pay | Admitting: Family Medicine

## 2018-09-10 DIAGNOSIS — M069 Rheumatoid arthritis, unspecified: Secondary | ICD-10-CM

## 2018-09-26 ENCOUNTER — Encounter: Payer: Self-pay | Admitting: Family Medicine

## 2018-09-26 ENCOUNTER — Ambulatory Visit (INDEPENDENT_AMBULATORY_CARE_PROVIDER_SITE_OTHER): Payer: BC Managed Care – PPO | Admitting: Family Medicine

## 2018-09-26 ENCOUNTER — Other Ambulatory Visit: Payer: Self-pay

## 2018-09-26 DIAGNOSIS — R6889 Other general symptoms and signs: Secondary | ICD-10-CM

## 2018-09-26 DIAGNOSIS — J069 Acute upper respiratory infection, unspecified: Secondary | ICD-10-CM

## 2018-09-26 DIAGNOSIS — Z20822 Contact with and (suspected) exposure to covid-19: Secondary | ICD-10-CM

## 2018-09-26 NOTE — Progress Notes (Signed)
Virtual Visit via telephone Note Due to COVID-19 pandemic this visit was conducted virtually. This visit type was conducted due to national recommendations for restrictions regarding the COVID-19 Pandemic (e.g. social distancing, sheltering in place) in an effort to limit this patient's exposure and mitigate transmission in our community. All issues noted in this document were discussed and addressed.  A physical exam was not performed with this format.   I connected with Katie [name] on 09/26/18 at 0905 by telephone and verified that I am speaking with the correct person using two identifiers. Katie [name] is currently located at home and family is currently with them during visit. The provider, Monia Pouch, FNP is located in their office at time of visit.  I discussed the limitations, risks, security and privacy concerns of performing an evaluation and management service by telephone and the availability of in person appointments. I also discussed with the patient that there may be a patient responsible charge related to this service. The patient expressed understanding and agreed to proceed.  Subjective:  Patient ID: Katie [name], female    DOB: 1960/05/04, 58 y.o.   MRN: [id number]  Chief Complaint:  URI   HPI: Katie [name] is a 58 y.o. female presenting on 09/26/2018 for URI   Pt reports cough, congestion, rhinorrhea, fever, chills, myalgias, and diarrhea. States symptoms started 2 days ago. States she has had known exposure to someone with COVID-19. States she does not have a fever today but still has the URI symptoms. No shortness of breath or chest pain. No confusion or weakness.   URI  This is a new problem. The current episode started in the past 7 days. The problem has been waxing and waning. The maximum temperature recorded prior to her arrival was 101 - 101.9 F. Associated symptoms include abdominal pain, congestion, coughing, diarrhea, headaches, rhinorrhea  and a sore throat. Pertinent negatives include no chest pain, dysuria, ear pain, joint pain, joint swelling, nausea, neck pain, plugged ear sensation, rash, sinus pain, sneezing, swollen glands, vomiting or wheezing. She has tried acetaminophen and decongestant for the symptoms. The treatment provided mild relief.     Relevant past medical, surgical, family, and social history reviewed and updated as indicated.  Allergies and medications reviewed and updated.   Past Medical History:  Diagnosis Date   Arthritis    RA   Asthma    no attack since childhood   Blood transfusion    Contact lens/glasses fitting    wears glasses or contacts   Cystocele    Depression    External hemorrhoids    Fibromyalgia    GERD (gastroesophageal reflux disease)    past hx    Glaucoma    RIGHT EYE   HPV (human papilloma virus) infection    Neuromuscular disorder (HCC)    fibromyalgia   Syncope 2015   Pt questioned seizure with this episode - nothing like since - vaso vagal response    Wears dentures    top    Past Surgical History:  Procedure Laterality Date   ABDOMINAL EXPLORATION SURGERY  1984   bleed after hyst   [place]   rt   CARPAL TUNNEL RELEASE Left 12/09/2012   Procedure: LEFT LIMITED OPEN CARPAL TUNNEL RELEASE;  Surgeon: Roseanne Kaufman, MD;  Location: [facility];  Service: Orthopedics;  Laterality: Left;  COLONOSCOPY     CYSTOSCOPY     DIAGNOSTIC LAPAROSCOPY     MINI SHUNT INSERTION  10/14/2011   Procedure: INSERTION OF MINI SHUNT;  Surgeon: Marylynn Pearson, MD;  Location: [facility];  Service: Ophthalmology;  Laterality: Right;   POLYPECTOMY     Tubaligation  1980   VAGINAL HYSTERECTOMY  1983    Social History   Socioeconomic History   Marital status: Married    Spouse name: Not on file   Number of children: 1   Years of education:  Not on file   Highest education level: Not on file  Occupational History    Employer: [employer]  Social Needs   Financial resource strain: Not on file   Food insecurity    Worry: Not on file    Inability: Not on file   Transportation needs    Medical: Not on file    Non-medical: Not on file  Tobacco Use   Smoking status: Former Smoker    Quit date: 10/11/1993    Years since quitting: 24.9   Smokeless tobacco: Never Used   Tobacco comment: 20 years ago  Substance and Sexual Activity   Alcohol use: Yes    Comment: Rarely: Daquari- cocktail   Drug use: No   Sexual activity: Not on file  Lifestyle   Physical activity    Days per week: Not on file    Minutes per session: Not on file   Stress: Not on file  Relationships   Social connections    Talks on phone: Not on file    Gets together: Not on file    Attends religious service: Not on file    Active member of club or organization: Not on file    Attends meetings of clubs or organizations: Not on file    Relationship status: Not on file   Intimate partner violence    Fear of current or ex partner: Not on file    Emotionally abused: Not on file    Physically abused: Not on file    Forced sexual activity: Not on file  Other Topics Concern   Not on file  Social History Narrative   Not on file    Outpatient Encounter Medications as of 09/26/2018  Medication Sig   calcium carbonate 1250 MG capsule Take 1,250 mg by mouth 2 (two) times daily with a meal.   clotrimazole-betamethasone (LOTRISONE) cream Apply 1 application topically 2 (two) times daily. (Patient not taking: Reported on Q000111Q)   folic acid (FOLVITE) Q000111Q MCG tablet Take 400 mcg by mouth daily.   LORazepam (ATIVAN) 0.5 MG tablet Take 1 tablet (0.5 mg total) by mouth 2 (two) times daily as needed for anxiety.   Multiple Vitamin (MULTIVITAMIN) tablet Take 1 tablet by mouth daily.     omeprazole (PRILOSEC) 20 MG capsule TAKE 1  CAPSULE (20 MG TOTAL) BY MOUTH 2 (TWO) TIMES DAILY BEFORE A MEAL FOR 30 DAYS.   predniSONE (DELTASONE) 10 MG tablet TAKE 1 TO 4 TABS DAILY FOR RHEUMATOID ARTHRITIS. PLEASE CALL TO SET UP APPT, NEED SEEN FOR REFILLS.   PROAIR HFA 108 (90 Base) MCG/ACT inhaler TAKE 2 PUFFS BY MOUTH EVERY 6 HOURS AS NEEDED FOR WHEEZE OR SHORTNESS OF BREATH   Probiotic Product (PROBIOTIC DAILY PO) Take by mouth daily.   No facility-administered encounter medications on file as of 09/26/2018.     Allergies  Allergen Reactions   Codeine Nausea And Vomiting    Review of Systems  Constitutional:  Positive for chills and fever. Negative for activity change, appetite change, diaphoresis, fatigue and unexpected weight change.  HENT: Positive for congestion, postnasal drip, rhinorrhea and sore throat. Negative for ear pain, sinus pain, sneezing, tinnitus, trouble swallowing and voice change.   Eyes: Negative.  Negative for photophobia and visual disturbance.  Respiratory: Positive for cough. Negative for chest tightness, shortness of breath and wheezing.   Cardiovascular: Negative for chest pain, palpitations and leg swelling.  Gastrointestinal: Positive for abdominal pain and diarrhea. Negative for abdominal distention, anal bleeding, blood in stool, constipation, nausea, rectal pain and vomiting.  Endocrine: Negative.   Genitourinary: Negative for decreased urine volume, difficulty urinating, dysuria, frequency and urgency.  Musculoskeletal: Positive for arthralgias and myalgias. Negative for joint pain and neck pain.  Skin: Negative.  Negative for color change and rash.  Allergic/Immunologic: Negative.   Neurological: Positive for headaches. Negative for dizziness, tremors, syncope, facial asymmetry, speech difficulty, weakness, light-headedness and numbness.  Hematological: Negative.   Psychiatric/Behavioral: Negative for agitation, confusion, hallucinations, sleep disturbance and suicidal ideas.  All other  systems reviewed and are negative.        Observations/Objective: No vital signs or physical exam, this was a telephone or virtual health encounter.  Pt alert and oriented, answers all questions appropriately, and able to speak in full sentences.    Assessment and Plan: Marie was seen today for uri.  Diagnoses and all orders for this visit:  URI with cough and congestion Suspected Covid-19 Virus Infection Pt with URI symptoms and known COVID-19 exposure. Will send for testing today. Pt aware of self quarantine guidelines and verbalized understanding. Pt aware of symptomatic care and when to seek emergent evaluation. Pt aware to report any new or worsening symptoms. Pt aware of testing sites and hours of operation.     Follow Up Instructions: Return if symptoms worsen or fail to improve.    I discussed the assessment and treatment plan with the patient. The patient was provided an opportunity to ask questions and all were answered. The patient agreed with the plan and demonstrated an understanding of the instructions.   The patient was advised to call back or seek an in-person evaluation if the symptoms worsen or if the condition fails to improve as anticipated.  The above assessment and management plan was discussed with the patient. The patient verbalized understanding of and has agreed to the management plan. Patient is aware to call the clinic if they develop any new symptoms or if symptoms persist or worsen. Patient is aware when to return to the clinic for a follow-up visit. Patient educated on when it is appropriate to go to the emergency department.    I provided 15 minutes of non-face-to-face time during this encounter. The call started at 0905. The call ended at 0920. The other time was used for coordination of care.    Monia Pouch, FNP-C [facility] [address] [place], [place] [address] [phone] 09/26/18

## 2018-09-27 ENCOUNTER — Other Ambulatory Visit: Payer: Self-pay

## 2018-09-27 DIAGNOSIS — Z20822 Contact with and (suspected) exposure to covid-19: Secondary | ICD-10-CM

## 2018-09-28 LAB — NOVEL CORONAVIRUS, NAA: SARS-CoV-2, NAA: DETECTED — AB

## 2018-10-01 ENCOUNTER — Encounter (INDEPENDENT_AMBULATORY_CARE_PROVIDER_SITE_OTHER): Payer: Self-pay

## 2018-10-03 ENCOUNTER — Encounter (INDEPENDENT_AMBULATORY_CARE_PROVIDER_SITE_OTHER): Payer: Self-pay

## 2018-10-03 ENCOUNTER — Telehealth: Payer: Self-pay

## 2018-10-03 NOTE — Telephone Encounter (Signed)
Patient advise per protocol on diarrhea. Patient encouraged to drink oral fluids and bland foods. Avoid alcohol,spicy foods,caffeine or fatty foods that could female diarrhea worse. Continue to monitor for signs of dehydration(increased thirst decreased urine output,yellow urine,dry skin,headache or dizziness). Patient also advise to try OTC medication(imodium,kaopectate,Pepto-Bismol) as per manufacturer's instructions.If worsening diarrhea occurs and becomes severe such  as 6-7 bowel movements notify pcp. If diarrhea last greater than 7 days notify pcp. Also if signs of dehydration occur(increased thirst,decreased urine output, yellow urine, dry skin, headache or dizziness) patient advise to call 911 and seek treatment in ED. Patient verbalized understanding and agrees with care plan.

## 2018-10-04 ENCOUNTER — Encounter (INDEPENDENT_AMBULATORY_CARE_PROVIDER_SITE_OTHER): Payer: Self-pay

## 2018-10-05 ENCOUNTER — Encounter (INDEPENDENT_AMBULATORY_CARE_PROVIDER_SITE_OTHER): Payer: Self-pay

## 2018-10-06 ENCOUNTER — Encounter (INDEPENDENT_AMBULATORY_CARE_PROVIDER_SITE_OTHER): Payer: Self-pay

## 2018-10-18 ENCOUNTER — Other Ambulatory Visit: Payer: Self-pay | Admitting: Family Medicine

## 2018-10-18 DIAGNOSIS — F329 Major depressive disorder, single episode, unspecified: Secondary | ICD-10-CM

## 2018-10-18 DIAGNOSIS — F419 Anxiety disorder, unspecified: Secondary | ICD-10-CM

## 2018-10-18 DIAGNOSIS — F32A Depression, unspecified: Secondary | ICD-10-CM

## 2018-11-09 ENCOUNTER — Other Ambulatory Visit: Payer: Self-pay

## 2018-11-10 ENCOUNTER — Ambulatory Visit (INDEPENDENT_AMBULATORY_CARE_PROVIDER_SITE_OTHER): Payer: BC Managed Care – PPO

## 2018-11-10 ENCOUNTER — Other Ambulatory Visit: Payer: Self-pay

## 2018-11-10 DIAGNOSIS — Z23 Encounter for immunization: Secondary | ICD-10-CM | POA: Diagnosis not present

## 2018-11-11 ENCOUNTER — Other Ambulatory Visit: Payer: Self-pay | Admitting: Family Medicine

## 2018-11-11 DIAGNOSIS — M069 Rheumatoid arthritis, unspecified: Secondary | ICD-10-CM

## 2018-12-22 ENCOUNTER — Other Ambulatory Visit: Payer: Self-pay | Admitting: Family Medicine

## 2018-12-22 DIAGNOSIS — K219 Gastro-esophageal reflux disease without esophagitis: Secondary | ICD-10-CM

## 2019-01-03 ENCOUNTER — Other Ambulatory Visit: Payer: Self-pay

## 2019-01-04 ENCOUNTER — Ambulatory Visit (INDEPENDENT_AMBULATORY_CARE_PROVIDER_SITE_OTHER): Payer: BC Managed Care – PPO | Admitting: Family Medicine

## 2019-01-04 ENCOUNTER — Encounter: Payer: Self-pay | Admitting: Family Medicine

## 2019-01-04 ENCOUNTER — Ambulatory Visit (INDEPENDENT_AMBULATORY_CARE_PROVIDER_SITE_OTHER): Payer: BC Managed Care – PPO

## 2019-01-04 VITALS — BP 104/62 | HR 71 | Temp 99.5°F | Ht 61.0 in | Wt 127.0 lb

## 2019-01-04 DIAGNOSIS — M25512 Pain in left shoulder: Secondary | ICD-10-CM

## 2019-01-04 DIAGNOSIS — R52 Pain, unspecified: Secondary | ICD-10-CM

## 2019-01-04 NOTE — Progress Notes (Signed)
Subjective: CC: shoulder pain PCP: Baruch Gouty, FNP [id number] Katie [name] is Katie 58 y.o. female presenting to clinic today for:  1. Shoulder pain Patient reports onset of left-sided shoulder pain about 1 month ago.  She describes as Katie deep pain that is occasionally sharp in nature.  Pain is exacerbated by raising her left upper extremity.  She denies any preceding injury but states she she is very physically active and often lifts, pushes and pulls things.  Her husband, who has Parkinson's disease, is often unable to assist her with heavy lifting.  She denies any catching.  Though she does report some crepitus.  Does not report any sensation changes in the left upper extremity. She notes that she has been using Tylenol and some ibuprofen with various degrees of improvement.  However never any resolution.  She is also been performing physical therapy exercises to try and preserve range of motion.  She is also icing the area and trying to guard it from further injury.   She has history of cervical spine problems as well as history of Katie left hand surgery.  She is established with [facility] for these issues.  Medical history is also significant for rheumatoid arthritis, for which she takes daily prednisone.    ROS: Per HPI  Allergies  Allergen Reactions  . Codeine Nausea And Vomiting   Past Medical History:  Diagnosis Date  . Arthritis    RA  . Asthma    no attack since childhood  . Blood transfusion   . Contact lens/glasses fitting    wears glasses or contacts  . Cystocele   . Depression   . External hemorrhoids   . Fibromyalgia   . GERD (gastroesophageal reflux disease)    past hx   . Glaucoma    RIGHT EYE  . HPV (human papilloma virus) infection   . Neuromuscular disorder (HCC)    fibromyalgia  . Syncope 2015   Pt questioned seizure with this episode - nothing like since - vaso vagal response   . Wears dentures    top    Current Outpatient Medications:  .  calcium  carbonate 1250 MG capsule, Take 1,250 mg by mouth 2 (two) times daily with Katie meal., Disp: , Rfl:  .  clotrimazole-betamethasone (LOTRISONE) cream, Apply 1 application topically 2 (two) times daily. (Patient not taking: Reported on 05/27/2018), Disp: 30 g, Rfl: 0 .  folic acid (FOLVITE) Q000111Q MCG tablet, Take 400 mcg by mouth daily., Disp: , Rfl:  .  LORazepam (ATIVAN) 0.5 MG tablet, Take 1 tablet (0.5 mg total) by mouth 2 (two) times daily as needed for anxiety., Disp: 30 tablet, Rfl: 1 .  Multiple Vitamin (MULTIVITAMIN) tablet, Take 1 tablet by mouth daily.  , Disp: , Rfl:  .  omeprazole (PRILOSEC) 20 MG capsule, Take 1 capsule (20 mg total) by mouth 2 (two) times daily before Katie meal. (Needs to be seen before next refill), Disp: 60 capsule, Rfl: 0 .  predniSONE (DELTASONE) 10 MG tablet, TAKE 1 TO 4 TABS DAILY FOR RHEUMATOID ARTHRITIS. PLEASE CALL TO SET UP APPT, NEED SEEN FOR REFILLS., Disp: 90 tablet, Rfl: 3 .  PROAIR HFA 108 (90 Base) MCG/ACT inhaler, TAKE 2 PUFFS BY MOUTH EVERY 6 HOURS AS NEEDED FOR WHEEZE OR SHORTNESS OF BREATH, Disp: 8.5 Inhaler, Rfl: 2 .  Probiotic Product (PROBIOTIC DAILY PO), Take by mouth daily., Disp: , Rfl:  Social History   Socioeconomic History  . Marital status: Married  Spouse name: Not on file  . Number of children: 1  . Years of education: Not on file  . Highest education level: Not on file  Occupational History    Employer: [employer]  Tobacco Use  . Smoking status: Former Smoker    Quit date: 10/11/1993    Years since quitting: 25.2  . Smokeless tobacco: Never Used  . Tobacco comment: 20 years ago  Substance and Sexual Activity  . Alcohol use: Yes    Comment: Rarely: Daquari- cocktail  . Drug use: No  . Sexual activity: Not on file  Other Topics Concern  . Not on file  Social History Narrative  . Not on file   Social Determinants of Health   Financial Resource Strain:   . Difficulty of Paying Living Expenses: Not on file  Food  Insecurity:   . Worried About Charity fundraiser in the Last Year: Not on file  . Ran Out of Food in the Last Year: Not on file  Transportation Needs:   . Lack of Transportation (Medical): Not on file  . Lack of Transportation (Non-Medical): Not on file  Physical Activity:   . Days of Exercise per Week: Not on file  . Minutes of Exercise per Session: Not on file  Stress:   . Feeling of Stress : Not on file  Social Connections:   . Frequency of Communication with Friends and Family: Not on file  . Frequency of Social Gatherings with Friends and Family: Not on file  . Attends Religious Services: Not on file  . Active Member of Clubs or Organizations: Not on file  . Attends Archivist Meetings: Not on file  . Marital Status: Not on file  Intimate Partner Violence:   . Fear of Current or Ex-Partner: Not on file  . Emotionally Abused: Not on file  . Physically Abused: Not on file  . Sexually Abused: Not on file   Family History  Problem Relation Age of Onset  . Hypertension Mother   . Hypothyroidism Mother   . Diabetes Other        Grandmother  . Bone cancer Daughter        Died at age 24  . Colon cancer Neg Hx   . Colon polyps Neg Hx     Objective: Office vital signs reviewed. BP 104/62   Pulse 71   Temp 99.5 F (37.5 C) (Temporal)   Ht 5\' 1"  (1.549 m)   Wt 127 lb (57.6 kg)   SpO2 99%   BMI 24.00 kg/m   Physical Examination:  General: Awake, alert, well nourished, No acute distress Extremities: warm, well perfused, No edema, cyanosis or clubbing; +2 pulses bilaterally MSK: normal gait and station  Left shoulder: No gross deformities appreciated.  No point tenderness to palpation.  She has generalized tenderness.  Painful arc is noted with active range of motion testing.  She is only able to lift to about 95 degrees before having pain.  Both internal and external rotation are limited by pain.  She has Katie positive empty can, positive Hawkins sign.  She has Katie  weakness with Hawkins. Neuro: Light touch and station grossly intact  No results found.  Assessment/ Plan: 58 y.o. female   1. Acute pain of left shoulder X-ray was obtained to evaluate for arthritic changes.  Personal review demonstrated no significant degenerative changes or evidence of dislocation.  I suspect that this is more likely to be rotator cuff versus labral in  nature given today's exam.  I have [facility] her [facility].  I think that she may benefit from evaluation of the rotator cuff under ultrasound.  We discussed the possibility of injection therapy versus physical therapy versus surgical intervention.  At this time she seems of exhausted conservative therapies.  Home physical therapy was given for presumed rotator cuff injury.  She will follow-up as needed - DG Shoulder Left; Future - Ambulatory referral to Orthopedic Surgery   Orders Placed This Encounter  Procedures  . DG Shoulder Left    Standing Status:   Future    Number of Occurrences:   1    Standing Expiration Date:   03/05/2020    Order Specific Question:   Reason for Exam (SYMPTOM  OR DIAGNOSIS REQUIRED)    Answer:   pain    Order Specific Question:   Is the patient pregnant?    Answer:   No    Order Specific Question:   Preferred imaging location?    Answer:   Internal   No orders of the defined types were placed in this encounter.    Janora Norlander, DO [place] [phone]

## 2019-01-04 NOTE — Patient Instructions (Signed)
I think that this is rotator cuff vs labral tear.

## 2019-01-09 ENCOUNTER — Telehealth: Payer: Self-pay | Admitting: Family Medicine

## 2019-01-09 ENCOUNTER — Other Ambulatory Visit: Payer: Self-pay | Admitting: Family Medicine

## 2019-01-09 DIAGNOSIS — Q766 Other congenital malformations of ribs: Secondary | ICD-10-CM

## 2019-01-09 DIAGNOSIS — Z808 Family history of malignant neoplasm of other organs or systems: Secondary | ICD-10-CM

## 2019-01-09 NOTE — Progress Notes (Signed)
I discussed patient's case with Dr. Anselm Pancoast, radiology, he recommends CT with contrast.  Need to rule out neoplasm.

## 2019-01-09 NOTE — Telephone Encounter (Signed)
[facility] wants to make sure Dr. Lajuana Ripple looks at Xray done on 12/30

## 2019-01-09 NOTE — Telephone Encounter (Signed)
Yes, I spoke to her this am.

## 2019-01-12 ENCOUNTER — Other Ambulatory Visit: Payer: Self-pay

## 2019-01-12 ENCOUNTER — Ambulatory Visit (HOSPITAL_COMMUNITY)
Admission: RE | Admit: 2019-01-12 | Discharge: 2019-01-12 | Disposition: A | Discharge status: Home / Self Care | Payer: 59 | Source: Ambulatory Visit | Attending: Family Medicine | Admitting: Family Medicine

## 2019-01-12 ENCOUNTER — Ambulatory Visit: Payer: 59 | Admitting: Orthopaedic Surgery

## 2019-01-12 ENCOUNTER — Encounter: Payer: Self-pay | Admitting: Orthopaedic Surgery

## 2019-01-12 VITALS — BP 108/60 | HR 64 | Ht 60.5 in | Wt 124.0 lb

## 2019-01-12 DIAGNOSIS — Z808 Family history of malignant neoplasm of other organs or systems: Secondary | ICD-10-CM | POA: Insufficient documentation

## 2019-01-12 DIAGNOSIS — M25512 Pain in left shoulder: Secondary | ICD-10-CM | POA: Diagnosis not present

## 2019-01-12 DIAGNOSIS — Q766 Other congenital malformations of ribs: Secondary | ICD-10-CM | POA: Diagnosis present

## 2019-01-12 DIAGNOSIS — G8929 Other chronic pain: Secondary | ICD-10-CM | POA: Diagnosis not present

## 2019-01-12 LAB — POCT I-STAT CREATININE: Creatinine, Ser: 0.7 mg/dL (ref 0.44–1.00)

## 2019-01-12 MED ORDER — IOHEXOL 300 MG/ML  SOLN
75.0000 mL | Freq: Once | INTRAMUSCULAR | Status: AC | PRN
  Administered 2019-01-12: 75 mL via INTRAVENOUS

## 2019-01-12 MED ORDER — NAPROXEN 500 MG PO TABS: 500.0000 mg | ORAL_TABLET | Freq: Two times a day (BID) | ORAL | 5 refills | Status: DC

## 2019-01-12 NOTE — Progress Notes (Signed)
Subjective:    Patient ID: Katie [name], female    DOB: Oct 06, 1960, 59 y.o.   MRN: [id number]  HPI She has had pain and tenderness of the left shoulder for several weeks, almost a month. Her pain is with overhead motion and extension. She has pain rolling on it at night. She has tried ice, head, Advil, Tylenol with only minimal help. She was seen at [facility].  I have reviewed her notes and X-rays. Xray report was: IMPRESSION: 1. No acute bone abnormality to the left shoulder. 2. Sclerosis and irregularity involving the left first and second ribs are nonspecific. Findings may be related to old trauma. No comparison imaging to evaluate the stability of this finding. Recommend chest CT to further evaluate the rib sclerosis and exclude a neoplastic process if the patient does not have a history of trauma or pathology in this area.   She is to have a CT of the lung as soon as she leaves here. She is aware of the report.  I have reviewed her x-rays as well.  She had a daughter die of bone cancer and is very concerned about the x-rays.   Review of Systems  Constitutional: Positive for activity change.  Musculoskeletal: Positive for arthralgias.  All other systems reviewed and are negative.  For Review of Systems, all other systems reviewed and are negative.  The following is a summary of the past history medically, past history surgically, known current medicines, social history and family history.  This information is gathered electronically by the computer from prior information and documentation.  I review this each visit and have found including this information at this point in the chart is beneficial and informative.   Past Medical History:  Diagnosis Date  . Arthritis    RA  . Asthma    no attack since childhood  . Blood transfusion   . Contact lens/glasses fitting    wears glasses or contacts  . Cystocele   . Depression   . External hemorrhoids   .  Fibromyalgia   . GERD (gastroesophageal reflux disease)    past hx   . Glaucoma    RIGHT EYE  . HPV (human papilloma virus) infection   . Neuromuscular disorder (HCC)    fibromyalgia  . Syncope 2015   Pt questioned seizure with this episode - nothing like since - vaso vagal response   . Wears dentures    top    Past Surgical History:  Procedure Laterality Date  . ABDOMINAL EXPLORATION SURGERY  1984   bleed after hyst  . APPENDECTOMY  1983  . BLADDER SUSPENSION  2010  . BREAST ENHANCEMENT SURGERY  2000  . [place]   rt  . CARPAL TUNNEL RELEASE Left 12/09/2012   Procedure: LEFT LIMITED OPEN CARPAL TUNNEL RELEASE;  Surgeon: Roseanne Kaufman, MD;  Location: [place];  Service: Orthopedics;  Laterality: Left;  . COLONOSCOPY    . CYSTOSCOPY    . DIAGNOSTIC LAPAROSCOPY    . MINI SHUNT INSERTION  10/14/2011   Procedure: INSERTION OF MINI SHUNT;  Surgeon: Marylynn Pearson, MD;  Location: [place];  Service: Ophthalmology;  Laterality: Right;  . POLYPECTOMY    . Tubaligation  1980  . VAGINAL HYSTERECTOMY  1983    Current Outpatient Medications on File Prior to Visit  Medication Sig Dispense Refill  . calcium carbonate 1250 MG capsule Take 1,250 mg by mouth 2 (two) times daily with a meal.    .  escitalopram (LEXAPRO) 10 MG tablet Take 10 mg by mouth daily.    . folic acid (FOLVITE) Q000111Q MCG tablet Take 400 mcg by mouth daily.    [name] LORazepam (ATIVAN) 0.5 MG tablet Take 1 tablet (0.5 mg total) by mouth 2 (two) times daily as needed for anxiety. 30 tablet 1  . Multiple Vitamin (MULTIVITAMIN) tablet Take 1 tablet by mouth daily.      [name] omeprazole (PRILOSEC) 20 MG capsule Take 1 capsule (20 mg total) by mouth 2 (two) times daily before a meal. (Needs to be seen before next refill) 60 capsule 0  . predniSONE (DELTASONE) 10 MG tablet TAKE 1 TO 4 TABS DAILY FOR RHEUMATOID ARTHRITIS. PLEASE CALL TO SET UP APPT, NEED SEEN FOR REFILLS. 90 tablet 3  . PROAIR HFA 108 (90  Base) MCG/ACT inhaler TAKE 2 PUFFS BY MOUTH EVERY 6 HOURS AS NEEDED FOR WHEEZE OR SHORTNESS OF BREATH 8.5 Inhaler 2  . Probiotic Product (PROBIOTIC DAILY PO) Take by mouth daily.     No current facility-administered medications on file prior to visit.    Social History   Socioeconomic History  . Marital status: Married    Spouse name: Not on file  . Number of children: 1  . Years of education: Not on file  . Highest education level: Not on file  Occupational History    Employer: [employer]  Tobacco Use  . Smoking status: Former Smoker    Quit date: 10/11/1993    Years since quitting: 25.2  . Smokeless tobacco: Never Used  . Tobacco comment: 20 years ago  Substance and Sexual Activity  . Alcohol use: Yes    Comment: Rarely: Daquari- cocktail  . Drug use: No  . Sexual activity: Not on file  Other Topics Concern  . Not on file  Social History Narrative  . Not on file   Social Determinants of Health   Financial Resource Strain:   . Difficulty of Paying Living Expenses: Not on file  Food Insecurity:   . Worried About Charity fundraiser in the Last Year: Not on file  . Ran Out of Food in the Last Year: Not on file  Transportation Needs:   . Lack of Transportation (Medical): Not on file  . Lack of Transportation (Non-Medical): Not on file  Physical Activity:   . Days of Exercise per Week: Not on file  . Minutes of Exercise per Session: Not on file  Stress:   . Feeling of Stress : Not on file  Social Connections:   . Frequency of Communication with Friends and Family: Not on file  . Frequency of Social Gatherings with Friends and Family: Not on file  . Attends Religious Services: Not on file  . Active Member of Clubs or Organizations: Not on file  . Attends Archivist Meetings: Not on file  . Marital Status: Not on file  Intimate Partner Violence:   . Fear of Current or Ex-Partner: Not on file  . Emotionally Abused: Not on file  . Physically Abused:  Not on file  . Sexually Abused: Not on file    Family History  Problem Relation Age of Onset  . Hypertension Mother   . Hypothyroidism Mother   . Diabetes Other        Grandmother  . Bone cancer Daughter        Died at age 67  . Colon cancer Neg Hx   . Colon polyps Neg Hx  BP 108/60   Pulse 64   Ht 5' 0.5" (1.537 m)   Wt 124 lb (56.2 kg)   BMI 23.82 kg/m   Body mass index is 23.82 kg/m.     Objective:   Physical Exam Vitals and nursing note reviewed.  Constitutional:      Appearance: She is well-developed.  HENT:     Head: Normocephalic and atraumatic.  Eyes:     Conjunctiva/sclera: Conjunctivae normal.     Pupils: Pupils are equal, round, and reactive to light.  Cardiovascular:     Rate and Rhythm: Normal rate and regular rhythm.  Pulmonary:     Effort: Pulmonary effort is normal.  Abdominal:     Palpations: Abdomen is soft.  Musculoskeletal:       Arms:     Cervical back: Normal range of motion and neck supple.  Skin:    General: Skin is warm and dry.  Neurological:     Mental Status: She is alert and oriented to person, place, and time.     Cranial Nerves: No cranial nerve deficit.     Motor: No abnormal muscle tone.     Coordination: Coordination normal.     Deep Tendon Reflexes: Reflexes are normal and symmetric. Reflexes normal.  Psychiatric:        Behavior: Behavior normal.        Thought Content: Thought content normal.        Judgment: Judgment normal.           Assessment & Plan:   Encounter Diagnosis  Name Primary?  . Chronic left shoulder pain Yes   PROCEDURE NOTE:  The patient request injection, verbal consent was obtained.  The left shoulder was prepped appropriately after time out was performed.   Sterile technique was observed and injection of 1 cc of Depo-Medrol 40 mg with several cc's of plain xylocaine. Anesthesia was provided by ethyl chloride and a 20-gauge needle was used to inject the shoulder area. A posterior  approach was used.  The injection was tolerated well.  A band aid dressing was applied.  The patient was advised to apply ice later today and tomorrow to the injection sight as needed.  I will see her back in two weeks.  Consider MRI of shoulder if not improved.  Call if any problem.  Precautions discussed.   Electronically Signed Sanjuana Kava, MD 1/7/202111:26 AM

## 2019-01-19 ENCOUNTER — Other Ambulatory Visit: Payer: Self-pay | Admitting: Family Medicine

## 2019-01-19 DIAGNOSIS — K219 Gastro-esophageal reflux disease without esophagitis: Secondary | ICD-10-CM

## 2019-01-19 NOTE — Telephone Encounter (Signed)
Rakes. NTBS 30 days given 12/22/18

## 2019-01-19 NOTE — Telephone Encounter (Signed)
Patient aware and states she does not need a refill.

## 2019-01-26 ENCOUNTER — Encounter: Payer: Self-pay | Admitting: Orthopaedic Surgery

## 2019-01-26 ENCOUNTER — Ambulatory Visit: Payer: 59 | Admitting: Orthopaedic Surgery

## 2019-01-26 ENCOUNTER — Other Ambulatory Visit: Payer: Self-pay

## 2019-01-26 VITALS — BP 117/60 | HR 69 | Ht 60.5 in | Wt 124.0 lb

## 2019-01-26 DIAGNOSIS — M25512 Pain in left shoulder: Secondary | ICD-10-CM | POA: Diagnosis not present

## 2019-01-26 DIAGNOSIS — G8929 Other chronic pain: Secondary | ICD-10-CM

## 2019-01-26 NOTE — Progress Notes (Signed)
I am so much better  She has little pain in the left shoulder today.  The injection really helped. ROM is full.  NV intact.  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 No bone lesion or other worrisome finding.  Encounter Diagnosis  Name Primary?  . Chronic left shoulder pain Yes   I will see her as needed.  Call if any problem.  Precautions discussed.    Electronically Signed Sanjuana Kava, MD 1/21/202110:54 AM

## 2019-02-04 ENCOUNTER — Other Ambulatory Visit: Payer: Self-pay | Admitting: Family Medicine

## 2019-02-04 DIAGNOSIS — M069 Rheumatoid arthritis, unspecified: Secondary | ICD-10-CM

## 2019-07-30 ENCOUNTER — Other Ambulatory Visit: Payer: Self-pay | Admitting: Orthopaedic Surgery

## 2020-01-24 ENCOUNTER — Encounter: Payer: Self-pay | Admitting: Nurse Practitioner

## 2020-01-24 ENCOUNTER — Ambulatory Visit (INDEPENDENT_AMBULATORY_CARE_PROVIDER_SITE_OTHER): Payer: 59 | Admitting: Nurse Practitioner

## 2020-01-24 ENCOUNTER — Other Ambulatory Visit: Payer: Self-pay

## 2020-01-24 ENCOUNTER — Telehealth: Payer: Self-pay | Admitting: Orthopaedic Surgery

## 2020-01-24 DIAGNOSIS — R110 Nausea: Secondary | ICD-10-CM

## 2020-01-24 DIAGNOSIS — R300 Dysuria: Secondary | ICD-10-CM | POA: Diagnosis not present

## 2020-01-24 LAB — URINALYSIS, MICROSCOPIC ONLY: WBC, UA: >30 /hpf — AB (ref 0–5)

## 2020-01-24 MED ORDER — ONDANSETRON HCL 4 MG PO TABS: 4.0000 mg | ORAL_TABLET | Freq: Three times a day (TID) | ORAL | 0 refills | Status: AC | PRN

## 2020-01-24 MED ORDER — CEPHALEXIN 500 MG PO CAPS: 500.0000 mg | ORAL_CAPSULE | Freq: Two times a day (BID) | ORAL | 0 refills | Status: DC

## 2020-01-24 NOTE — Patient Instructions (Signed)
Urinary Tract Infection, Adult  A urinary tract infection (UTI) is an infection of any part of the urinary tract. The urinary tract includes the kidneys, ureters, bladder, and urethra. These organs make, store, and get rid of urine in the body. An upper UTI affects the ureters and kidneys. A lower UTI affects the bladder and urethra. What are the causes? Most urinary tract infections are caused by bacteria in your genital area around your urethra, where urine leaves your body. These bacteria grow and cause inflammation of your urinary tract. What increases the risk? You are more likely to develop this condition if:  You have a urinary catheter that stays in place.  You are not able to control when you urinate or have a bowel movement (incontinence).  You are female and you: ? Use a spermicide or diaphragm for birth control. ? Have low estrogen levels. ? Are pregnant.  You have certain genes that increase your risk.  You are sexually active.  You take antibiotic medicines.  You have a condition that causes your flow of urine to slow down, such as: ? An enlarged prostate, if you are female. ? Blockage in your urethra. ? A kidney stone. ? A nerve condition that affects your bladder control (neurogenic bladder). ? Not getting enough to drink, or not urinating often.  You have certain medical conditions, such as: ? Diabetes. ? A weak disease-fighting system (immunesystem). ? Sickle cell disease. ? Gout. ? Spinal cord injury. What are the signs or symptoms? Symptoms of this condition include:  Needing to urinate right away (urgency).  Frequent urination. This may include small amounts of urine each time you urinate.  Pain or burning with urination.  Blood in the urine.  Urine that smells bad or unusual.  Trouble urinating.  Cloudy urine.  Vaginal discharge, if you are female.  Pain in the abdomen or the lower back. You may also have:  Vomiting or a decreased  appetite.  Confusion.  Irritability or tiredness.  A fever or chills.  Diarrhea. The first symptom in older adults may be confusion. In some cases, they may not have any symptoms until the infection has worsened. How is this diagnosed? This condition is diagnosed based on your medical history and a physical exam. You may also have other tests, including:  Urine tests.  Blood tests.  Tests for STIs (sexually transmitted infections). If you have had more than one UTI, a cystoscopy or imaging studies may be done to determine the cause of the infections. How is this treated? Treatment for this condition includes:  Antibiotic medicine.  Over-the-counter medicines to treat discomfort.  Drinking enough water to stay hydrated. If you have frequent infections or have other conditions such as a kidney stone, you may need to see a health care provider who specializes in the urinary tract (urologist). In rare cases, urinary tract infections can cause sepsis. Sepsis is a life-threatening condition that occurs when the body responds to an infection. Sepsis is treated in the hospital with IV antibiotics, fluids, and other medicines. Follow these instructions at home: Medicines  Take over-the-counter and prescription medicines only as told by your health care provider.  If you were prescribed an antibiotic medicine, take it as told by your health care provider. Do not stop using the antibiotic even if you start to feel better. General instructions  Make sure you: ? Empty your bladder often and completely. Do not hold urine for long periods of time. ? Empty your bladder after   sex. ? Wipe from front to back after urinating or having a bowel movement if you are female. Use each tissue only one time when you wipe.  Drink enough fluid to keep your urine pale yellow.  Keep all follow-up visits. This is important.   Contact a health care provider if:  Your symptoms do not get better after 1-2  days.  Your symptoms go away and then return. Get help right away if:  You have severe pain in your back or your lower abdomen.  You have a fever or chills.  You have nausea or vomiting. Summary  A urinary tract infection (UTI) is an infection of any part of the urinary tract, which includes the kidneys, ureters, bladder, and urethra.  Most urinary tract infections are caused by bacteria in your genital area.  Treatment for this condition often includes antibiotic medicines.  If you were prescribed an antibiotic medicine, take it as told by your health care provider. Do not stop using the antibiotic even if you start to feel better.  Keep all follow-up visits. This is important. This information is not intended to replace advice given to you by your health care provider. Make sure you discuss any questions you have with your health care provider. Document Revised: 08/04/2019 Document Reviewed: 08/04/2019 Elsevier Patient Education  2021 [employer]  

## 2020-01-24 NOTE — Progress Notes (Signed)
   Virtual Visit via telephone Note Due to COVID-19 pandemic this visit was conducted virtually. This visit type was conducted due to national recommendations for restrictions regarding the COVID-19 Pandemic (e.g. social distancing, sheltering in place) in an effort to limit this patient's exposure and mitigate transmission in our community. All issues noted in this document were discussed and addressed.  A physical exam was not performed with this format.  I connected with Katie [name] on 01/24/20 at 1:34 by telephone and verified that I am speaking with the correct person using two identifiers. Katie [name] is currently located at home  during visit. The provider, Ivy Lynn, NP is located in their office at time of visit.  I discussed the limitations, risks, security and privacy concerns of performing an evaluation and management service by telephone and the availability of in person appointments. I also discussed with the patient that there may be a patient responsible charge related to this service. The patient expressed understanding and agreed to proceed.   History and Present Illness:  Urinary Tract Infection  This is a new problem. The current episode started in the past 7 days. The problem occurs every urination. The quality of the pain is described as aching. The pain is moderate. There has been no fever. There is no history of pyelonephritis. Associated symptoms include frequency, nausea and urgency. Pertinent negatives include no chills. There is no history of kidney stones.      Review of Systems  Constitutional: Negative for chills and fever.  HENT: Negative.   Respiratory: Negative.   Gastrointestinal: Positive for nausea.  Genitourinary: Positive for frequency and urgency.  Musculoskeletal:       Pelvic pain  All other systems reviewed and are negative.    Observations/Objective:   Tele-visit  Assessment and Plan:   Dysuria Symptoms not well  controlled. Patient is reporting symptoms of urgency, dysuria, pelvic pain and mild nausea. Started patient on Keflex, Zofran for nausea. Urine sent to culture-results pending. Will change treatment with lab results. Advised patient to increase hydration, acetaminophen/ibuprofen for pain,  Follow-up with worsening or unresolved symptoms.  Education sent to pharmacy    Follow Up Instructions:  Follow up with unresolved symptoms    I discussed the assessment and treatment plan with the patient. The patient was provided an opportunity to ask questions and all were answered. The patient agreed with the plan and demonstrated an understanding of the instructions.   The patient was advised to call back or seek an in-person evaluation if the symptoms worsen or if the condition fails to improve as anticipated.  The above assessment and management plan was discussed with the patient. The patient verbalized understanding of and has agreed to the management plan. Patient is aware to call the clinic if symptoms persist or worsen. Patient is aware when to return to the clinic for a follow-up visit. Patient educated on when it is appropriate to go to the emergency department.   Time call ended: 1:44 pm   I provided 11 minutes of non-face-to-face time during this encounter.    Ivy Lynn, NP

## 2020-01-24 NOTE — Telephone Encounter (Signed)
Rx request 

## 2020-01-24 NOTE — Assessment & Plan Note (Signed)
Symptoms not well controlled. Patient is reporting symptoms of urgency, dysuria, pelvic pain and mild nausea. Started patient on Keflex, Zofran for nausea. Urine sent to culture-results pending. Will change treatment with lab results. Advised patient to increase hydration, acetaminophen/ibuprofen for pain,  Follow-up with worsening or unresolved symptoms.  Education sent to pharmacy

## 2020-01-28 LAB — URINE CULTURE

## 2020-07-28 ENCOUNTER — Telehealth: Payer: Self-pay | Admitting: Orthopaedic Surgery

## 2020-11-08 ENCOUNTER — Telehealth: Payer: Self-pay | Admitting: Family Medicine

## 2020-11-08 ENCOUNTER — Encounter: Payer: Self-pay | Admitting: Family Medicine

## 2020-11-08 ENCOUNTER — Other Ambulatory Visit: Payer: Self-pay

## 2020-11-08 ENCOUNTER — Ambulatory Visit (INDEPENDENT_AMBULATORY_CARE_PROVIDER_SITE_OTHER): Payer: 59 | Admitting: Family Medicine

## 2020-11-08 VITALS — BP 120/72 | HR 64 | Temp 97.5°F | Ht 60.0 in | Wt 127.8 lb

## 2020-11-08 DIAGNOSIS — F339 Major depressive disorder, recurrent, unspecified: Secondary | ICD-10-CM

## 2020-11-08 DIAGNOSIS — M069 Rheumatoid arthritis, unspecified: Secondary | ICD-10-CM | POA: Diagnosis not present

## 2020-11-08 DIAGNOSIS — F411 Generalized anxiety disorder: Secondary | ICD-10-CM | POA: Diagnosis not present

## 2020-11-08 DIAGNOSIS — R109 Unspecified abdominal pain: Secondary | ICD-10-CM | POA: Diagnosis not present

## 2020-11-08 DIAGNOSIS — Z23 Encounter for immunization: Secondary | ICD-10-CM

## 2020-11-08 DIAGNOSIS — N3000 Acute cystitis without hematuria: Secondary | ICD-10-CM

## 2020-11-08 LAB — URINALYSIS, ROUTINE W REFLEX MICROSCOPIC
Bilirubin, UA: NEGATIVE
Glucose, UA: NEGATIVE
Ketones, UA: NEGATIVE
Nitrite, UA: NEGATIVE
Protein,UA: NEGATIVE
RBC, UA: NEGATIVE
Specific Gravity, UA: 1.015 (ref 1.005–1.030)
Urobilinogen, Ur: 0.2 mg/dL (ref 0.2–1.0)
pH, UA: 6.5 (ref 5.0–7.5)

## 2020-11-08 LAB — MICROSCOPIC EXAMINATION: Renal Epithel, UA: NONE SEEN /hpf

## 2020-11-08 MED ORDER — PREDNISONE 10 MG (21) PO TBPK: ORAL_TABLET | ORAL | 0 refills | Status: DC

## 2020-11-08 NOTE — Progress Notes (Signed)
Subjective:  Patient ID: Katie [name], female    DOB: 1960/05/19, 60 y.o.   MRN: [id number]  Patient Care Team: Janora Norlander, DO as PCP - General (Family Medicine)   Chief Complaint:  Hand Pain (Arthritic hand pain, increase over last 3 months//Stomach pain, in between stomach and pelvis, bilateral)   HPI: Katie [name] is a 60 y.o. female presenting on 11/08/2020 for Hand Pain (Arthritic hand pain, increase over last 3 months//Stomach pain, in between stomach and pelvis, bilateral)   Pt presents today with bilateral hand pain. She has RA and has not seen rheumatology in several months. She states her hands have been swelling and painful for several weeks. She denies injury. No fever, chills, weakness, or confusion. No erythema.  She also reports lower abdominal pressure which is intermittent. She does have to take stool softeners on a regular basis. She denies specific urinary symptoms. No vaginal symptoms.  She also reports ongoing anxiety and depression with fatigue. She is taking lexapro but is out of her Ativan. She states she is busy caring for everyone else and does not have time to take care of herself.    Depression screen [facility] 2/9 11/08/2020 01/04/2019 06/10/2018 05/27/2018 02/21/2018  Decreased Interest 1 0 0 1 3  Down, Depressed, Hopeless 0 0 0 2 2  PHQ - 2 Score 1 0 0 3 5  Altered sleeping 2 0 0 2 2  Tired, decreased energy 3 0 1 3 3   Change in appetite 0 0 0 0 2  Feeling bad or failure about yourself  0 0 0 0 0  Trouble concentrating - 0 0 2 3  Moving slowly or fidgety/restless 0 0 0 0 2  Suicidal thoughts 0 0 0 1 0  PHQ-9 Score 6 0 1 11 17   Difficult doing work/chores Somewhat difficult - - - -  Some recent data might be hidden   GAD 7 : Generalized Anxiety Score 11/08/2020 06/10/2018 05/27/2018 02/21/2018  Nervous, Anxious, on Edge 1 0 2 3  Control/stop worrying 2 1 2 3   Worry too much - different things 2 1 2 3   Trouble relaxing 1 0 1 3  Restless 1 0 0  0  Easily annoyed or irritable 0 1 1 1   Afraid - awful might happen 1 0 1 3  Total GAD 7 Score 8 3 9 16   Anxiety Difficulty - - - Somewhat difficult         Relevant past medical, surgical, family, and social history reviewed and updated as indicated.  Allergies and medications reviewed and updated. Data reviewed: Chart in Epic.   Past Medical History:  Diagnosis Date   Arthritis    RA   Asthma    no attack since childhood   Blood transfusion    Contact lens/glasses fitting    wears glasses or contacts   Cystocele    Depression    External hemorrhoids    Fibromyalgia    GERD (gastroesophageal reflux disease)    past hx    Glaucoma    RIGHT EYE   HPV (human papilloma virus) infection    Neuromuscular disorder (HCC)    fibromyalgia   Syncope 2015   Pt questioned seizure with this episode - nothing like since - vaso vagal response    Wears dentures    top    Past Surgical History:  Procedure Laterality Date   ABDOMINAL EXPLORATION SURGERY  1984   bleed after hyst  APPENDECTOMY  1983   BLADDER SUSPENSION  2010   BREAST ENHANCEMENT SURGERY  2000   CARPAL TUNNEL RELEASE  1995   rt   CARPAL TUNNEL RELEASE Left 12/09/2012   Procedure: LEFT LIMITED OPEN CARPAL TUNNEL RELEASE;  Surgeon: Roseanne Kaufman, MD;  Location: [place];  Service: Orthopedics;  Laterality: Left;   COLONOSCOPY     CYSTOSCOPY     DIAGNOSTIC LAPAROSCOPY     MINI SHUNT INSERTION  10/14/2011   Procedure: INSERTION OF MINI SHUNT;  Surgeon: Marylynn Pearson, MD;  Location: [place];  Service: Ophthalmology;  Laterality: Right;   POLYPECTOMY     Tubaligation  1980   VAGINAL HYSTERECTOMY  1983    Social History   Socioeconomic History   Marital status: Married    Spouse name: Not on file   Number of children: 1   Years of education: Not on file   Highest education level: Not on file  Occupational History    Employer: [employer]  Tobacco Use   Smoking status: Former     Types: Cigarettes    Quit date: 10/11/1993    Years since quitting: 27.0   Smokeless tobacco: Never   Tobacco comments:    20 years ago  Vaping Use   Vaping Use: Never used  Substance and Sexual Activity   Alcohol use: Yes    Comment: Rarely: [place]- cocktail   Drug use: No   Sexual activity: Not on file  Other Topics Concern   Not on file  Social History Narrative   Not on file   Social Determinants of Health   Financial Resource Strain: Not on file  Food Insecurity: Not on file  Transportation Needs: Not on file  Physical Activity: Not on file  Stress: Not on file  Social Connections: Not on file  Intimate Partner Violence: Not on file    Outpatient Encounter Medications as of 11/08/2020  Medication Sig   calcium carbonate 1250 MG capsule Take 1,250 mg by mouth 2 (two) times daily with a meal.   Cyanocobalamin (B-12 PO) Take by mouth.   escitalopram (LEXAPRO) 10 MG tablet Take 10 mg by mouth daily.   folic acid (FOLVITE) 161 MCG tablet Take 400 mcg by mouth daily.   LORazepam (ATIVAN) 0.5 MG tablet Take 1 tablet (0.5 mg total) by mouth 2 (two) times daily as needed for anxiety.   MAGNESIUM PO Take by mouth.   Multiple Vitamin (MULTIVITAMIN) tablet Take 1 tablet by mouth daily.     naproxen (NAPROSYN) 500 MG tablet TAKE 1 TABLET BY MOUTH 2 TIMES DAILY WITH A MEAL.   ondansetron (ZOFRAN) 4 MG tablet Take 1 tablet (4 mg total) by mouth every 8 (eight) hours as needed for nausea or vomiting.   predniSONE (STERAPRED UNI-PAK 21 TAB) 10 MG (21) TBPK tablet Use as directed on back of pill pack   PROAIR HFA 108 (90 Base) MCG/ACT inhaler TAKE 2 PUFFS BY MOUTH EVERY 6 HOURS AS NEEDED FOR WHEEZE OR SHORTNESS OF BREATH   Probiotic Product (PROBIOTIC DAILY PO) Take by mouth daily.   TURMERIC PO Take by mouth.   [DISCONTINUED] cephALEXin (KEFLEX) 500 MG capsule Take 1 capsule (500 mg total) by mouth 2 (two) times daily.   [DISCONTINUED] predniSONE (DELTASONE) 10 MG tablet TAKE 1 TO 4  TABS DAILY FOR RHEUMATOID ARTHRITIS. PLEASE CALL TO SET UP APPT, NEED SEEN FOR REFILLS.   omeprazole (PRILOSEC) 20 MG capsule Take 1 capsule (20 mg total) by mouth  2 (two) times daily before a meal. (Needs to be seen before next refill)   No facility-administered encounter medications on file as of 11/08/2020.    Allergies  Allergen Reactions   Codeine Nausea And Vomiting    Review of Systems  Constitutional:  Positive for appetite change and fatigue. Negative for activity change, chills, diaphoresis, fever and unexpected weight change.  HENT: Negative.    Eyes: Negative.   Respiratory:  Negative for cough, chest tightness and shortness of breath.   Cardiovascular:  Negative for palpitations and leg swelling.  Gastrointestinal:  Positive for abdominal pain (lower, intermittent) and constipation. Negative for anal bleeding, blood in stool, diarrhea, nausea, rectal pain and vomiting.  Endocrine: Negative.   Genitourinary:  Negative for decreased urine volume, difficulty urinating, dysuria, frequency and urgency.  Musculoskeletal:  Positive for arthralgias and joint swelling. Negative for myalgias.  Skin: Negative.   Allergic/Immunologic: Negative.   Neurological:  Negative for dizziness, tremors, seizures, syncope, facial asymmetry, speech difficulty, weakness, light-headedness and headaches.  Hematological: Negative.   Psychiatric/Behavioral:  Positive for decreased concentration, dysphoric mood and sleep disturbance. Negative for agitation, behavioral problems, confusion, hallucinations, self-injury and suicidal ideas. The patient is nervous/anxious. The patient is not hyperactive.   All other systems reviewed and are negative.      Objective:  BP 120/72   Pulse 64   Temp (!) 97.5 F (36.4 C)   Ht 5' (1.524 m)   Wt 127 lb 12.8 oz (58 kg)   SpO2 97%   BMI 24.96 kg/m    Wt Readings from Last 3 Encounters:  11/08/20 127 lb 12.8 oz (58 kg)  01/26/19 124 lb (56.2 kg)  01/12/19  124 lb (56.2 kg)    Physical Exam Vitals and nursing note reviewed.  Constitutional:      General: She is not in acute distress.    Appearance: Normal appearance. She is well-developed, well-groomed and normal weight. She is not ill-appearing, toxic-appearing or diaphoretic.  HENT:     Head: Normocephalic and atraumatic.     Jaw: There is normal jaw occlusion.     Right Ear: Hearing normal.     Left Ear: Hearing normal.     Nose: Nose normal.     Mouth/Throat:     Lips: Pink.     Mouth: Mucous membranes are moist.     Pharynx: Oropharynx is clear. Uvula midline.  Eyes:     General: Lids are normal.     Extraocular Movements: Extraocular movements intact.     Conjunctiva/sclera: Conjunctivae normal.     Pupils: Pupils are equal, round, and reactive to light.  Neck:     Thyroid: No thyroid mass, thyromegaly or thyroid tenderness.     Vascular: No carotid bruit or JVD.     Trachea: Trachea and phonation normal.  Cardiovascular:     Rate and Rhythm: Normal rate and regular rhythm.     Chest Wall: PMI is not displaced.     Pulses: Normal pulses.     Heart sounds: Normal heart sounds. No murmur heard.   No friction rub. No gallop.  Pulmonary:     Effort: Pulmonary effort is normal. No respiratory distress.     Breath sounds: Normal breath sounds. No wheezing.  Abdominal:     General: Bowel sounds are normal. There is no distension or abdominal bruit.     Palpations: Abdomen is soft. There is no hepatomegaly, splenomegaly or mass.     Tenderness: There is no abdominal  tenderness. There is no right CVA tenderness, left CVA tenderness, guarding or rebound.     Hernia: No hernia is present.  Musculoskeletal:     Right wrist: Normal.     Left wrist: Normal.     Right hand: Swelling and tenderness present. No deformity, lacerations or bony tenderness. Decreased range of motion. Decreased strength. Normal sensation. There is no disruption of two-point discrimination. Normal capillary  refill. Normal pulse.     Left hand: Swelling and tenderness present. No deformity, lacerations or bony tenderness. Decreased range of motion. Decreased strength. Normal sensation. There is no disruption of two-point discrimination. Normal capillary refill. Normal pulse.     Cervical back: Normal range of motion and neck supple.     Right lower leg: No edema.     Left lower leg: No edema.     Comments: Bilateral DIP and PIP joint swelling of hands  Lymphadenopathy:     Cervical: No cervical adenopathy.  Skin:    General: Skin is warm and dry.     Capillary Refill: Capillary refill takes less than 2 seconds.     Coloration: Skin is not cyanotic, jaundiced or pale.     Findings: No rash.  Neurological:     General: No focal deficit present.     Mental Status: She is alert and oriented to person, place, and time.     Sensory: Sensation is intact.     Motor: Motor function is intact.     Coordination: Coordination is intact.     Gait: Gait is intact.     Deep Tendon Reflexes: Reflexes are normal and symmetric.  Psychiatric:        Attention and Perception: Attention and perception normal.        Mood and Affect: Mood and affect normal.        Speech: Speech normal.        Behavior: Behavior normal. Behavior is cooperative.        Thought Content: Thought content normal.        Cognition and Memory: Cognition and memory normal.        Judgment: Judgment normal.    Results for orders placed or performed in visit on 11/08/20  Microscopic Examination   Urine  Result Value Ref Range   WBC, UA 0-5 0 - 5 /hpf   RBC 0-2 0 - 2 /hpf   Epithelial Cells (non renal) 0-10 0 - 10 /hpf   Renal Epithel, UA None seen None seen /hpf   Bacteria, UA Few (A) None seen/Few  Urinalysis, Routine w reflex microscopic  Result Value Ref Range   Specific Gravity, UA 1.015 1.005 - 1.030   pH, UA 6.5 5.0 - 7.5   Color, UA Yellow Yellow   Appearance Ur Clear Clear   Leukocytes,UA Trace (A) Negative    Protein,UA Negative Negative/Trace   Glucose, UA Negative Negative   Ketones, UA Negative Negative   RBC, UA Negative Negative   Bilirubin, UA Negative Negative   Urobilinogen, Ur 0.2 0.2 - 1.0 mg/dL   Nitrite, UA Negative Negative   Microscopic Examination See below:        Pertinent labs & imaging results that were available during my care of the patient were reviewed by me and considered in my medical decision making.  Assessment & Plan:  Mell was seen today for hand pain.  Diagnoses and all orders for this visit:  Rheumatoid arthritis involving both hands, unspecified whether rheumatoid factor present ([place])  Has not seen rheumatology in a long time. Bilateral hand pain with noted DIP and PIP joint swelling. Will burst with steroids. Follow up in 2 weeks for reevaluation or sooner if needed.  -     predniSONE (STERAPRED UNI-PAK 21 TAB) 10 MG (21) TBPK tablet; Use as directed on back of pill pack  Abdominal pain in female No indications of acute appendicitis in office. Urinalysis unremarkable. Culture pending. CBC and CMP pending. Pt aware to report any new, worsening, or persistent symptoms.  -     CBC with Differential/Platelet -     CMP14+EGFR -     Urinalysis, Routine w reflex microscopic -     Urine Culture -     Microscopic Examination  Depression, recurrent (HCC) GAD (generalized anxiety disorder) Will check thyroid function today. Pt has been taking lexapro but feels her anxiety is worse at times. Has not been taking Ativan. No SI or HI.  -     Thyroid Panel With TSH    Continue all other maintenance medications.  Follow up plan: Return in about 2 weeks (around 11/22/2020), or if symptoms worsen or fail to improve.   Continue healthy lifestyle choices, including diet (rich in fruits, vegetables, and lean proteins, and low in salt and simple carbohydrates) and exercise (at least 30 minutes of moderate physical activity daily).  Educational handout given for  RA  The above assessment and management plan was discussed with the patient. The patient verbalized understanding of and has agreed to the management plan. Patient is aware to call the clinic if they develop any new symptoms or if symptoms persist or worsen. Patient is aware when to return to the clinic for a follow-up visit. Patient educated on when it is appropriate to go to the emergency department.   Monia Pouch, FNP-C [facility] [phone]

## 2020-11-08 NOTE — Telephone Encounter (Signed)
Pt was a previous pt of Rakes and wanted to switch back to her. Please call her and let her know when a decision is made.

## 2020-11-09 LAB — CMP14+EGFR
ALT: 13 IU/L (ref 0–32)
AST: 18 IU/L (ref 0–40)
Albumin/Globulin Ratio: 2.1 (ref 1.2–2.2)
Albumin: 4.4 g/dL (ref 3.8–4.8)
Alkaline Phosphatase: 84 IU/L (ref 44–121)
BUN/Creatinine Ratio: 10 — ABNORMAL LOW (ref 12–28)
BUN: 9 mg/dL (ref 8–27)
Bilirubin Total: 0.4 mg/dL (ref 0.0–1.2)
CO2: 25 mmol/L (ref 20–29)
Calcium: 9.6 mg/dL (ref 8.7–10.3)
Chloride: 103 mmol/L (ref 96–106)
Creatinine, Ser: 0.89 mg/dL (ref 0.57–1.00)
Globulin, Total: 2.1 g/dL (ref 1.5–4.5)
Glucose: 78 mg/dL (ref 70–99)
Potassium: 3.6 mmol/L (ref 3.5–5.2)
Sodium: 140 mmol/L (ref 134–144)
Total Protein: 6.5 g/dL (ref 6.0–8.5)
eGFR: 73 mL/min/{1.73_m2} (ref >59)

## 2020-11-09 LAB — THYROID PANEL WITH TSH
Free Thyroxine Index: 1.4 (ref 1.2–4.9)
T3 Uptake Ratio: 24 % (ref 24–39)
T4, Total: 5.9 ug/dL (ref 4.5–12.0)
TSH: 1.64 u[IU]/mL (ref 0.450–4.500)

## 2020-11-09 LAB — CBC WITH DIFFERENTIAL/PLATELET
Basophils Absolute: 0 10*3/uL (ref 0.0–0.2)
Basos: 0 %
EOS (ABSOLUTE): 0.1 10*3/uL (ref 0.0–0.4)
Eos: 1 %
Hematocrit: 41.5 % (ref 34.0–46.6)
Hemoglobin: 13.9 g/dL (ref 11.1–15.9)
Immature Grans (Abs): 0 10*3/uL (ref 0.0–0.1)
Immature Granulocytes: 0 %
Lymphocytes Absolute: 1.6 10*3/uL (ref 0.7–3.1)
Lymphs: 27 %
MCH: 31 pg (ref 26.6–33.0)
MCHC: 33.5 g/dL (ref 31.5–35.7)
MCV: 93 fL (ref 79–97)
Monocytes Absolute: 0.6 10*3/uL (ref 0.1–0.9)
Monocytes: 10 %
Neutrophils Absolute: 3.6 10*3/uL (ref 1.4–7.0)
Neutrophils: 62 %
Platelets: 188 10*3/uL (ref 150–450)
RBC: 4.48 x10E6/uL (ref 3.77–5.28)
RDW: 13.1 % (ref 11.7–15.4)
WBC: 6 10*3/uL (ref 3.4–10.8)

## 2020-11-11 NOTE — Telephone Encounter (Signed)
Ok with me 

## 2020-11-14 LAB — URINE CULTURE

## 2020-11-14 MED ORDER — AMOXICILLIN-POT CLAVULANATE 875-125 MG PO TABS: 1.0000 | ORAL_TABLET | Freq: Two times a day (BID) | ORAL | 0 refills | Status: DC

## 2020-11-14 NOTE — Addendum Note (Signed)
Addended by: Baruch Gouty on: 11/14/2020 07:55 AM   Modules accepted: Orders

## 2020-11-21 ENCOUNTER — Encounter: Payer: Self-pay | Admitting: Family Medicine

## 2020-11-21 ENCOUNTER — Ambulatory Visit (INDEPENDENT_AMBULATORY_CARE_PROVIDER_SITE_OTHER): Payer: 59 | Admitting: Family Medicine

## 2020-11-21 ENCOUNTER — Other Ambulatory Visit: Payer: Self-pay

## 2020-11-21 VITALS — BP 100/64 | HR 80 | Temp 97.5°F | Ht 60.0 in | Wt 130.0 lb

## 2020-11-21 DIAGNOSIS — Z1212 Encounter for screening for malignant neoplasm of rectum: Secondary | ICD-10-CM

## 2020-11-21 DIAGNOSIS — Z1211 Encounter for screening for malignant neoplasm of colon: Secondary | ICD-10-CM | POA: Diagnosis not present

## 2020-11-21 DIAGNOSIS — M069 Rheumatoid arthritis, unspecified: Secondary | ICD-10-CM

## 2020-11-21 DIAGNOSIS — F411 Generalized anxiety disorder: Secondary | ICD-10-CM | POA: Diagnosis not present

## 2020-11-21 DIAGNOSIS — F339 Major depressive disorder, recurrent, unspecified: Secondary | ICD-10-CM

## 2020-11-21 MED ORDER — HYDROXYZINE HCL 10 MG PO TABS: 10.0000 mg | ORAL_TABLET | Freq: Three times a day (TID) | ORAL | 0 refills | Status: DC | PRN

## 2020-11-21 NOTE — Progress Notes (Signed)
Subjective:  Patient ID: Katie [name], female    DOB: 1960/05/14, 60 y.o.   MRN: [id number]  Patient Care Team: Baruch Gouty, FNP as PCP - General (Family Medicine)   Chief Complaint:  Follow-up (RA, steroid therapy)   HPI: Katie [name] is a 60 y.o. female presenting on 11/21/2020 for Follow-up (RA, steroid therapy)   Patient presents today for reevaluation of rheumatoid arthritis.  At last visit she was dosed with steroids.  States this is very beneficial when on medications but symptoms return once dosing is completed.  She has not been to rheumatology in some time and would like a referral today as her symptoms seem to be worsening.  She also reports that her anxiety and depression symptoms are waxing and waning.  States she does not like taking Lexapro as she feels it causes weight gain.  She would like to taper herself off.  She would also like something to help with significant anxiety during the transition off of Lexapro.  She has been on Ativan in the past but has not tried Atarax.  Withdrawal symptoms discussed with patient.  She has not had her colonoscopy and would like to do Cologuard for colorectal cancer screening.  No first-degree relatives with history of colorectal cancer.  No personal history.  Depression screen [facility] 2/9 11/21/2020 11/08/2020 01/04/2019 06/10/2018 05/27/2018  Decreased Interest 1 1 0 0 1  Down, Depressed, Hopeless 0 0 0 0 2  PHQ - 2 Score 1 1 0 0 3  Altered sleeping 1 2 0 0 2  Tired, decreased energy 2 3 0 1 3  Change in appetite 0 0 0 0 0  Feeling bad or failure about yourself  0 0 0 0 0  Trouble concentrating 2 - 0 0 2  Moving slowly or fidgety/restless 0 0 0 0 0  Suicidal thoughts 0 0 0 0 1  PHQ-9 Score 6 6 0 1 11  Difficult doing work/chores Not difficult at all Somewhat difficult - - -  Some recent data might be hidden   GAD 7 : Generalized Anxiety Score 11/21/2020 11/08/2020 06/10/2018 05/27/2018  Nervous, Anxious, on Edge 1 1 0 2   Control/stop worrying 2 2 1 2   Worry too much - different things 2 2 1 2   Trouble relaxing 2 1 0 1  Restless 1 1 0 0  Easily annoyed or irritable 1 0 1 1  Afraid - awful might happen 0 1 0 1  Total GAD 7 Score 9 8 3 9   Anxiety Difficulty Somewhat difficult - - -       Relevant past medical, surgical, family, and social history reviewed and updated as indicated.  Allergies and medications reviewed and updated. Data reviewed: Chart in Epic.   Past Medical History:  Diagnosis Date   Arthritis    RA   Asthma    no attack since childhood   Blood transfusion    Contact lens/glasses fitting    wears glasses or contacts   Cystocele    Depression    External hemorrhoids    Fibromyalgia    GERD (gastroesophageal reflux disease)    past hx    Glaucoma    RIGHT EYE   HPV (human papilloma virus) infection    Neuromuscular disorder ([place])    fibromyalgia   Syncope 2015   Pt questioned seizure with this episode - nothing like since - vaso vagal response    Wears dentures  top    Past Surgical History:  Procedure Laterality Date   ABDOMINAL EXPLORATION SURGERY  1984   bleed after hyst   APPENDECTOMY  1983   BLADDER SUSPENSION  2010   BREAST ENHANCEMENT SURGERY  2000   CARPAL TUNNEL RELEASE  1995   rt   CARPAL TUNNEL RELEASE Left 12/09/2012   Procedure: LEFT LIMITED OPEN CARPAL TUNNEL RELEASE;  Surgeon: Roseanne Kaufman, MD;  Location: [place];  Service: Orthopedics;  Laterality: Left;   COLONOSCOPY     CYSTOSCOPY     DIAGNOSTIC LAPAROSCOPY     MINI SHUNT INSERTION  10/14/2011   Procedure: INSERTION OF MINI SHUNT;  Surgeon: Marylynn Pearson, MD;  Location: [place];  Service: Ophthalmology;  Laterality: Right;   POLYPECTOMY     Tubaligation  1980   VAGINAL HYSTERECTOMY  1983    Social History   Socioeconomic History   Marital status: Married    Spouse name: Not on file   Number of children: 1   Years of education: Not on file   Highest education level:  Not on file  Occupational History    Employer: [employer]  Tobacco Use   Smoking status: Former    Types: Cigarettes    Quit date: 10/11/1993    Years since quitting: 27.1   Smokeless tobacco: Never   Tobacco comments:    20 years ago  Vaping Use   Vaping Use: Never used  Substance and Sexual Activity   Alcohol use: Yes    Comment: Rarely: [place]quari- cocktail   Drug use: No   Sexual activity: Not on file  Other Topics Concern   Not on file  Social History Narrative   Not on file   Social Determinants of Health   Financial Resource Strain: Not on file  Food Insecurity: Not on file  Transportation Needs: Not on file  Physical Activity: Not on file  Stress: Not on file  Social Connections: Not on file  Intimate Partner Violence: Not on file    Outpatient Encounter Medications as of 11/21/2020  Medication Sig   calcium carbonate 1250 MG capsule Take 1,250 mg by mouth 2 (two) times daily with a meal.   Cyanocobalamin (B-12 PO) Take by mouth.   escitalopram (LEXAPRO) 10 MG tablet Take 10 mg by mouth daily.   folic acid (FOLVITE) 378 MCG tablet Take 400 mcg by mouth daily.   hydrOXYzine (ATARAX/VISTARIL) 10 MG tablet Take 1 tablet (10 mg total) by mouth 3 (three) times daily as needed for anxiety.   LORazepam (ATIVAN) 0.5 MG tablet Take 1 tablet (0.5 mg total) by mouth 2 (two) times daily as needed for anxiety.   MAGNESIUM PO Take by mouth.   Multiple Vitamin (MULTIVITAMIN) tablet Take 1 tablet by mouth daily.     naproxen (NAPROSYN) 500 MG tablet TAKE 1 TABLET BY MOUTH 2 TIMES DAILY WITH A MEAL.   ondansetron (ZOFRAN) 4 MG tablet Take 1 tablet (4 mg total) by mouth every 8 (eight) hours as needed for nausea or vomiting.   PROAIR HFA 108 (90 Base) MCG/ACT inhaler TAKE 2 PUFFS BY MOUTH EVERY 6 HOURS AS NEEDED FOR WHEEZE OR SHORTNESS OF BREATH   Probiotic Product (PROBIOTIC DAILY PO) Take by mouth daily.   TURMERIC PO Take by mouth.   [DISCONTINUED]  amoxicillin-clavulanate (AUGMENTIN) 875-125 MG tablet Take 1 tablet by mouth 2 (two) times daily for 7 days.   Aspirin 81 MG CAPS Aspirin 81 mg   omeprazole (PRILOSEC) 20  MG capsule Take 1 capsule (20 mg total) by mouth 2 (two) times daily before a meal. (Needs to be seen before next refill)   [DISCONTINUED] predniSONE (STERAPRED UNI-PAK 21 TAB) 10 MG (21) TBPK tablet Use as directed on back of pill pack (Patient not taking: Reported on 11/21/2020)   No facility-administered encounter medications on file as of 11/21/2020.    Allergies  Allergen Reactions   Codeine Nausea And Vomiting    Review of Systems  Constitutional:  Positive for activity change and fatigue. Negative for appetite change, chills, diaphoresis, fever and unexpected weight change.  HENT: Negative.    Eyes: Negative.   Respiratory:  Negative for cough, chest tightness and shortness of breath.   Cardiovascular:  Negative for chest pain, palpitations and leg swelling.  Gastrointestinal:  Negative for abdominal pain, blood in stool, constipation, diarrhea, nausea and vomiting.  Endocrine: Negative.   Genitourinary:  Negative for decreased urine volume, difficulty urinating, dysuria, frequency and urgency.  Musculoskeletal:  Positive for arthralgias and joint swelling. Negative for myalgias.  Skin: Negative.   Allergic/Immunologic: Negative.   Neurological:  Negative for dizziness and headaches.  Hematological: Negative.   Psychiatric/Behavioral:  Positive for agitation, decreased concentration, dysphoric mood and sleep disturbance. Negative for behavioral problems, confusion, hallucinations, self-injury and suicidal ideas. The patient is nervous/anxious. The patient is not hyperactive.   All other systems reviewed and are negative.      Objective:  BP 100/64   Pulse 80   Temp (!) 97.5 F (36.4 C)   Ht 5' (1.524 m)   Wt 130 lb (59 kg)   SpO2 96%   BMI 25.39 kg/m    Wt Readings from Last 3 Encounters:  11/21/20  130 lb (59 kg)  11/08/20 127 lb 12.8 oz (58 kg)  01/26/19 124 lb (56.2 kg)    Physical Exam Vitals and nursing note reviewed.  Constitutional:      General: She is not in acute distress.    Appearance: Normal appearance. She is well-developed and well-groomed. She is not ill-appearing, toxic-appearing or diaphoretic.  HENT:     Head: Normocephalic and atraumatic.     Jaw: There is normal jaw occlusion.     Right Ear: Hearing normal.     Left Ear: Hearing normal.     Nose: Nose normal.     Mouth/Throat:     Lips: Pink.     Mouth: Mucous membranes are moist.     Pharynx: Oropharynx is clear. Uvula midline.  Eyes:     General: Lids are normal.     Extraocular Movements: Extraocular movements intact.     Conjunctiva/sclera: Conjunctivae normal.     Pupils: Pupils are equal, round, and reactive to light.  Neck:     Thyroid: No thyroid mass, thyromegaly or thyroid tenderness.     Vascular: No carotid bruit or JVD.     Trachea: Trachea and phonation normal.  Cardiovascular:     Rate and Rhythm: Normal rate and regular rhythm.     Chest Wall: PMI is not displaced.     Pulses: Normal pulses.     Heart sounds: Normal heart sounds. No murmur heard.   No friction rub. No gallop.  Pulmonary:     Effort: Pulmonary effort is normal. No respiratory distress.     Breath sounds: Normal breath sounds. No wheezing.  Abdominal:     General: There is no abdominal bruit.     Palpations: There is no hepatomegaly or splenomegaly.  Musculoskeletal:  Right wrist: Normal.     Left wrist: Normal.     Right hand: Swelling, deformity (DIP and PIP joint swelling) and tenderness present. No lacerations or bony tenderness. Decreased range of motion. Normal strength. Normal sensation. There is no disruption of two-point discrimination. Normal capillary refill. Normal pulse.     Left hand: Swelling, deformity (DIP and PIP joint swelling) and tenderness present. No lacerations or bony tenderness.  Decreased range of motion. Normal strength. Normal sensation. There is no disruption of two-point discrimination. Normal capillary refill. Normal pulse.     Cervical back: Normal range of motion and neck supple.     Right lower leg: No edema.     Left lower leg: No edema.  Lymphadenopathy:     Cervical: No cervical adenopathy.  Skin:    General: Skin is warm and dry.     Capillary Refill: Capillary refill takes less than 2 seconds.     Coloration: Skin is not cyanotic, jaundiced or pale.     Findings: No rash.  Neurological:     General: No focal deficit present.     Mental Status: She is alert and oriented to person, place, and time.     Sensory: Sensation is intact.     Motor: Motor function is intact.     Coordination: Coordination is intact.     Gait: Gait is intact.     Deep Tendon Reflexes: Reflexes are normal and symmetric.  Psychiatric:        Attention and Perception: Attention and perception normal.        Mood and Affect: Mood and affect normal.        Speech: Speech normal.        Behavior: Behavior normal. Behavior is cooperative.        Thought Content: Thought content normal.        Cognition and Memory: Cognition and memory normal.        Judgment: Judgment normal.    Results for orders placed or performed in visit on 11/08/20  Urine Culture   Specimen: Urine   UR  Result Value Ref Range   Urine Culture, Routine Final report (A)    Organism ID, Bacteria Comment (A)    ORGANISM ID, BACTERIA Comment   Microscopic Examination   Urine  Result Value Ref Range   WBC, UA 0-5 0 - 5 /hpf   RBC 0-2 0 - 2 /hpf   Epithelial Cells (non renal) 0-10 0 - 10 /hpf   Renal Epithel, UA None seen None seen /hpf   Bacteria, UA Few (A) None seen/Few  CBC with Differential/Platelet  Result Value Ref Range   WBC 6.0 3.4 - 10.8 x10E3/uL   RBC 4.48 3.77 - 5.28 x10E6/uL   Hemoglobin 13.9 11.1 - 15.9 g/dL   Hematocrit 41.5 34.0 - 46.6 %   MCV 93 79 - 97 fL   MCH 31.0 26.6 -  33.0 pg   MCHC 33.5 31.5 - 35.7 g/dL   RDW 13.1 11.7 - 15.4 %   Platelets 188 150 - 450 x10E3/uL   Neutrophils 62 Not Estab. %   Lymphs 27 Not Estab. %   Monocytes 10 Not Estab. %   Eos 1 Not Estab. %   Basos 0 Not Estab. %   Neutrophils Absolute 3.6 1.4 - 7.0 x10E3/uL   Lymphocytes Absolute 1.6 0.7 - 3.1 x10E3/uL   Monocytes Absolute 0.6 0.1 - 0.9 x10E3/uL   EOS (ABSOLUTE) 0.1 0.0 - 0.4 x10E3/uL  Basophils Absolute 0.0 0.0 - 0.2 x10E3/uL   Immature Granulocytes 0 Not Estab. %   Immature Grans (Abs) 0.0 0.0 - 0.1 x10E3/uL  CMP14+EGFR  Result Value Ref Range   Glucose 78 70 - 99 mg/dL   BUN 9 8 - 27 mg/dL   Creatinine, Ser 0.89 0.57 - 1.00 mg/dL   eGFR 73 >59 mL/min/1.73   BUN/Creatinine Ratio 10 (L) 12 - 28   Sodium 140 134 - 144 mmol/L   Potassium 3.6 3.5 - 5.2 mmol/L   Chloride 103 96 - 106 mmol/L   CO2 25 20 - 29 mmol/L   Calcium 9.6 8.7 - 10.3 mg/dL   Total Protein 6.5 6.0 - 8.5 g/dL   Albumin 4.4 3.8 - 4.8 g/dL   Globulin, Total 2.1 1.5 - 4.5 g/dL   Albumin/Globulin Ratio 2.1 1.2 - 2.2   Bilirubin Total 0.4 0.0 - 1.2 mg/dL   Alkaline Phosphatase 84 44 - 121 IU/L   AST 18 0 - 40 IU/L   ALT 13 0 - 32 IU/L  Urinalysis, Routine w reflex microscopic  Result Value Ref Range   Specific Gravity, UA 1.015 1.005 - 1.030   pH, UA 6.5 5.0 - 7.5   Color, UA Yellow Yellow   Appearance Ur Clear Clear   Leukocytes,UA Trace (A) Negative   Protein,UA Negative Negative/Trace   Glucose, UA Negative Negative   Ketones, UA Negative Negative   RBC, UA Negative Negative   Bilirubin, UA Negative Negative   Urobilinogen, Ur 0.2 0.2 - 1.0 mg/dL   Nitrite, UA Negative Negative   Microscopic Examination See below:   Thyroid Panel With TSH  Result Value Ref Range   TSH 1.640 0.450 - 4.500 uIU/mL   T4, Total 5.9 4.5 - 12.0 ug/dL   T3 Uptake Ratio 24 24 - 39 %   Free Thyroxine Index 1.4 1.2 - 4.9       Pertinent labs & imaging results that were available during my care of the  patient were reviewed by me and considered in my medical decision making.  Assessment & Plan:  Shaili was seen today for follow-up.  Diagnoses and all orders for this visit:  Rheumatoid arthritis involving both hands, unspecified whether rheumatoid factor present [facility]) Would like new referral to rheumatology.  Order placed today.  Symptomatic care discussed in detail.  Patient aware to report any new or worsening symptoms. -     Ambulatory referral to Rheumatology  GAD (generalized anxiety disorder) Depression, recurrent ([place]) Patient would like to come off of Lexapro.  Patient educated on proper tapering of medication.  Withdrawal discussed in detail.  Will provide with Atarax 3 times daily as needed anxiety during discontinuation of medications.  Patient aware to report any new or worsening symptoms.  Follow-up in office in 3 months, sooner if needed. -     hydrOXYzine (ATARAX/VISTARIL) 10 MG tablet; Take 1 tablet (10 mg total) by mouth 3 (three) times daily as needed for anxiety.   Screening for colorectal cancer Does not wish to complete colonoscopy.  Agrees to [employer]. -     Cologuard     Continue all other maintenance medications.  Follow up plan: Return in about 3 months (around 02/21/2021), or if symptoms worsen or fail to improve.   Continue healthy lifestyle choices, including diet (rich in fruits, vegetables, and lean proteins, and low in salt and simple carbohydrates) and exercise (at least 30 minutes of moderate physical activity daily).  Educational handout given for GAD,  Cologuard  The above assessment and management plan was discussed with the patient. The patient verbalized understanding of and has agreed to the management plan. Patient is aware to call the clinic if they develop any new symptoms or if symptoms persist or worsen. Patient is aware when to return to the clinic for a follow-up visit. Patient educated on when it is appropriate to go to the emergency  department.   Monia Pouch, FNP-C [facility] [phone]

## 2020-11-21 NOTE — Patient Instructions (Signed)
  Cologuard  Your provider has prescribed Cologuard, an easy-to-use, noninvasive test for colon cancer screening, based on the latest advances in stool DNA science.   Here's what will happen next:  1. You may receive a call or email from [employer] to confirm your mailing address and insurance information 2. Your kit will be shipped directly to you 3. You collect your stool sample in the privacy of your own home. Please follow the instructions that come with the kit 4. You return the kit via [employer] shipping or pick-up, in the same box it arrived in 5. You should receive a call with the results once they are available. If you do not receive a call, please contact our office at [phone]  Insurance Coverage  Cologuard is covered by Medicare and most major insurers Cologuard is covered by Medicare and Medicare Advantage with no co-pay or deductible for eligible patients ages 32-85. [employer], more than 94% of Cologuard patients have no out-of-pocket cost for screening. Based on the [place] should be covered by most private insurers with no co-pay or deductible for eligible patients (ages 70-75; at average risk for colon cancer; without symptoms). Currently, ~74% of Cologuard patients 45-49 have had no out-of-pocket cost for screening.  Many national and regional payers have begun paying for CRC screening at 98. [employer] continues to work with payers to expand coverage and access for patients ages 75-49.   Only your healthcare insurance provider can confirm how Cologuard will be covered for you. If you have questions about coverage, you can contact your insurance company directly or ask the specialists at [employer] to do that for you. A Customer Support Specialist can be reached at [phone].    Patient Support Screening for colon cancer is very important to your good health, so if you have any questions at all, please call [facility]  Customer Support Specialists at [phone]. They are available 24 hours a day, 6 days a week. An instructional video is available to view online at [email or web address]   *Information and graphics obtained from [email or web address]

## 2020-12-10 LAB — COLOGUARD: COLOGUARD: NEGATIVE

## 2021-01-12 NOTE — Progress Notes (Deleted)
Office Visit Note  Patient: Katie [name]             Date of Birth: 1960-06-15           MRN: [id number]             PCP: Baruch Gouty, FNP Referring: Baruch Gouty, FNP Visit Date: 01/13/2021 Occupation: @GUAROCC @  Subjective:  No chief complaint on file.   History of Present Illness: Katie [name] is a 61 y.o. female here for rheumatoid arthritis with joint pain and deformity in bilateral hands. Symptoms greatly improved while on prednisone without long lasting results she is not on any DMARD. She takes naproxen and turmeric as treatments for arthritis symptoms.***   Activities of Daily Living:  Patient reports morning stiffness for *** {minute/hour:19697}.   Patient {ACTIONS;DENIES/REPORTS:21021675::"Denies"} nocturnal pain.  Difficulty dressing/grooming: {ACTIONS;DENIES/REPORTS:21021675::"Denies"} Difficulty climbing stairs: {ACTIONS;DENIES/REPORTS:21021675::"Denies"} Difficulty getting out of chair: {ACTIONS;DENIES/REPORTS:21021675::"Denies"} Difficulty using hands for taps, buttons, cutlery, and/or writing: {ACTIONS;DENIES/REPORTS:21021675::"Denies"}  No Rheumatology ROS completed.   PMFS History:  Patient Active Problem List   Diagnosis Date Noted   Dysuria 01/24/2020   Decreased libido 06/02/2018   Depression, recurrent ([place]) 05/27/2018   GAD (generalized anxiety disorder) 05/27/2018   Gastroesophageal reflux disease without esophagitis 05/27/2018   Anxiety and depression 05/27/2016   Hair loss 05/27/2016   RA (rheumatoid arthritis) ([place]) 05/27/2016    Past Medical History:  Diagnosis Date   Arthritis    RA   Asthma    no attack since childhood   Blood transfusion    Contact lens/glasses fitting    wears glasses or contacts   Cystocele    Depression    External hemorrhoids    Fibromyalgia    GERD (gastroesophageal reflux disease)    past hx    Glaucoma    RIGHT EYE   HPV (human papilloma virus) infection    Neuromuscular disorder ([place])     fibromyalgia   Syncope 2015   Pt questioned seizure with this episode - nothing like since - vaso vagal response    Wears dentures    top    Family History  Problem Relation Age of Onset   Hypertension Mother    Hypothyroidism Mother    Diabetes Other        Grandmother   Bone cancer Daughter        Died at age 5   Colon cancer Neg Hx    Colon polyps Neg Hx    Past Surgical History:  Procedure Laterality Date   ABDOMINAL EXPLORATION SURGERY  1984   bleed after hyst   APPENDECTOMY  1983   BLADDER SUSPENSION  2010   BREAST ENHANCEMENT SURGERY  2000   CARPAL TUNNEL RELEASE  1995   rt   CARPAL TUNNEL RELEASE Left 12/09/2012   Procedure: LEFT LIMITED OPEN CARPAL TUNNEL RELEASE;  Surgeon: Roseanne Kaufman, MD;  Location: [place];  Service: Orthopedics;  Laterality: Left;   COLONOSCOPY     CYSTOSCOPY     DIAGNOSTIC LAPAROSCOPY     MINI SHUNT INSERTION  10/14/2011   Procedure: INSERTION OF MINI SHUNT;  Surgeon: Marylynn Pearson, MD;  Location: [place];  Service: Ophthalmology;  Laterality: Right;   POLYPECTOMY     [place]   Social History   Social History Narrative   Not on file   Immunization History  Administered Date(s) Administered   Influenza,inj,Quad PF,6+ Mos 11/08/2020   Influenza-Unspecified 11/07/2018  Moderna Sars-Covid-2 Vaccination 05/29/2019   Pneumococcal Conjugate-13 11/10/2018   Td 02/21/2018   Tdap 02/21/2018     Objective: Vital Signs: There were no vitals taken for this visit.   Physical Exam   Musculoskeletal Exam: ***  CDAI Exam: CDAI Score: -- Patient Global: --; Provider Global: -- Swollen: --; Tender: -- Joint Exam 01/13/2021   No joint exam has been documented for this visit   There is currently no information documented on the homunculus. Go to the Rheumatology activity and complete the homunculus joint exam.  Investigation: No additional findings.  Imaging: No results  found.  Recent Labs: Lab Results  Component Value Date   WBC 6.0 11/08/2020   HGB 13.9 11/08/2020   PLT 188 11/08/2020   NA 140 11/08/2020   K 3.6 11/08/2020   CL 103 11/08/2020   CO2 25 11/08/2020   GLUCOSE 78 11/08/2020   BUN 9 11/08/2020   CREATININE 0.89 11/08/2020   BILITOT 0.4 11/08/2020   ALKPHOS 84 11/08/2020   AST 18 11/08/2020   ALT 13 11/08/2020   PROT 6.5 11/08/2020   ALBUMIN 4.4 11/08/2020   CALCIUM 9.6 11/08/2020   GFRAA 89 05/27/2018    Speciality Comments: No specialty comments available.  Procedures:  No procedures performed Allergies: Codeine   Assessment / Plan:     Visit Diagnoses: No diagnosis found.  Orders: No orders of the defined types were placed in this encounter.  No orders of the defined types were placed in this encounter.   Face-to-face time spent with patient was *** minutes. Greater than 50% of time was spent in counseling and coordination of care.  Follow-Up Instructions: No follow-ups on file.   Collier Salina, MD  Note - This record has been created using [employer].  Chart creation errors have been sought, but may not always  have been located. Such creation errors do not reflect on  the standard of medical care.

## 2021-01-13 ENCOUNTER — Ambulatory Visit: Payer: 59 | Admitting: Internal Medicine

## 2021-01-29 ENCOUNTER — Ambulatory Visit: Payer: 59 | Admitting: Internal Medicine

## 2021-02-11 ENCOUNTER — Encounter: Payer: Self-pay | Admitting: Nurse Practitioner

## 2021-02-11 ENCOUNTER — Ambulatory Visit (INDEPENDENT_AMBULATORY_CARE_PROVIDER_SITE_OTHER): Payer: 59 | Admitting: Nurse Practitioner

## 2021-02-11 VITALS — BP 100/60 | HR 62 | Temp 98.5°F | Ht 60.0 in | Wt 126.0 lb

## 2021-02-11 DIAGNOSIS — L089 Local infection of the skin and subcutaneous tissue, unspecified: Secondary | ICD-10-CM | POA: Diagnosis not present

## 2021-02-11 MED ORDER — CEPHALEXIN 500 MG PO CAPS: 500.0000 mg | ORAL_CAPSULE | Freq: Two times a day (BID) | ORAL | 0 refills | Status: DC

## 2021-02-11 NOTE — Patient Instructions (Signed)
Erysipelas Erysipelas is an infection that affects the skin and tissues near the surface of the skin. It causes the skin to become red, swollen, and painful. The infection is most common on the legs but may also affect other areas, such as the face. With treatment, the infection usually goes away in a few days. If not treated, the infection can spread or lead to other problems, such as collections of pus (abscesses). What are the causes? This condition is caused by bacteria. Most often, it is caused by bacteria called streptococci.  Bacteria may get into the skin through a break in the skin, such as: A cut or scrape. An incision from surgery. A burn. An insect bite. An open sore. A crack in the skin. Sometimes, it is not known how the bacteria infected the skin. What increases the risk? You are more likely to develop this condition if you: Are a young child. Are an older adult. Have a weakened disease-fighting system (immune system), such as having HIV or AIDS. Have diabetes. Drink a lot of alcohol. Had recent surgery. Have a yeast infection of the skin. Have swollen legs. What are the signs or symptoms? The infection causes a reddened area on the skin. This reddened area may: Be painful and swollen. Have a clear border around it. Feel itchy and hot. Develop blisters or abscesses. Other symptoms may include: Fever. Chills. Nausea and vomiting. Swollen glands (lymph nodes), such as in the neck. Headache. Feeling tired (fatigue). Loss of appetite. How is this diagnosed? This condition is diagnosed based on: A physical exam. Your health care provider will examine your skin closely. Your symptoms and medical history. How is this treated? This condition is treated with antibiotic medicine. Symptoms usually get better within a few days after starting antibiotics. Follow these instructions at home: Medicines Take other over-the-counter and prescription medicines only as told by  your health care provider. Take your antibiotic medicine as told by your health care provider. Do not stop taking the antibiotic even if your condition starts to improve. Ask your health care provider if it is safe for you to take medicines for pain as needed, such as acetaminophen or ibuprofen. General instructions  If the affected area is on an arm or leg, raise (elevate) the affected arm or leg above the level of your heart while you are sitting or lying down. Do not put any creams or lotions on the affected area of your skin unless your health care provider tells you to do that. Do not share bedding, towels, or washcloths (linens) with other people. Use only your own linens to prevent the infection from spreading to others. Follow instructions from your health care provider about how to take care of your wound. Make sure you: Wash your hands with soap and water for at least 20 seconds before and after you change your bandage (dressing). If soap and water are not available, use hand sanitizer. Change your dressing as told by your health care provider. Keep all follow-up visits. This is important. Contact a health care provider if: Your symptoms do not improve within 1-2 days of starting treatment. You develop new symptoms. You have a fever. You feel generally sick (malaise) with muscle aches and pains. Get help right away if: Your symptoms get worse. You develop vomiting or diarrhea that does not go away. Your red area gets larger or turns dark in color. You notice red streaks coming from the infected area. Summary Erysipelas is an infection affecting the  skin and tissues near the surface of the skin. It causes the skin to become red, swollen, and painful. This condition is caused by bacteria. Most often, it is caused by bacteria called streptococci. Bacteria may enter through a break in the skin. Sometimes, it is not known how the bacteria infected the skin. This condition is treated  with antibiotic medicine. Symptoms usually get better within a few days after starting antibiotics. This information is not intended to replace advice given to you by your health care provider. Make sure you discuss any questions you have with your health care provider. Document Revised: 05/25/2019 Document Reviewed: 05/24/2019 Elsevier Patient Education  [place].

## 2021-02-11 NOTE — Progress Notes (Signed)
Acute Office Visit  Subjective:    Patient ID: Katie [name], female    DOB: Dec 30, 1960, 61 y.o.   MRN: [id number]  Chief Complaint  Patient presents with   swollen finger    HPI Patient is in today for skin infection of the left ring finger.  Patient reports symptoms present for the past 3 days.  Finger is tender to touch, swollen and presents as cellulitis.  No fever, nausea or headache associated with this complaint.  Patient has tried nothing for symptoms.  Past Medical History:  Diagnosis Date   Arthritis    RA   Asthma    no attack since childhood   Blood transfusion    Contact lens/glasses fitting    wears glasses or contacts   Cystocele    Depression    External hemorrhoids    Fibromyalgia    GERD (gastroesophageal reflux disease)    past hx    Glaucoma    RIGHT EYE   HPV (human papilloma virus) infection    Neuromuscular disorder (HCC)    fibromyalgia   Syncope 2015   Pt questioned seizure with this episode - nothing like since - vaso vagal response    Wears dentures    top    Past Surgical History:  Procedure Laterality Date   ABDOMINAL EXPLORATION SURGERY  1984   bleed after hyst   [place]   rt   CARPAL TUNNEL RELEASE Left 12/09/2012   Procedure: LEFT LIMITED OPEN CARPAL TUNNEL RELEASE;  Surgeon: Roseanne Kaufman, MD;  Location: [facility];  Service: Orthopedics;  Laterality: Left;   COLONOSCOPY     CYSTOSCOPY     DIAGNOSTIC LAPAROSCOPY     MINI SHUNT INSERTION  10/14/2011   Procedure: INSERTION OF MINI SHUNT;  Surgeon: Marylynn Pearson, MD;  Location: [facility];  Service: Ophthalmology;  Laterality: Right;   POLYPECTOMY     Tubaligation  1980   VAGINAL HYSTERECTOMY  1983    Family History  Problem Relation Age of Onset   Hypertension Mother    Hypothyroidism Mother    Diabetes Other        Grandmother   Bone cancer Daughter         Died at age 48   Colon cancer Neg Hx    Colon polyps Neg Hx     Social History   Socioeconomic History   Marital status: Married    Spouse name: Not on file   Number of children: 1   Years of education: Not on file   Highest education level: Not on file  Occupational History    Employer: [employer]  Tobacco Use   Smoking status: Former    Types: Cigarettes    Quit date: 10/11/1993    Years since quitting: 27.3   Smokeless tobacco: Never   Tobacco comments:    20 years ago  Vaping Use   Vaping Use: Never used  Substance and Sexual Activity   Alcohol use: Yes    Comment: Rarely: [place]- cocktail   Drug use: No   Sexual activity: Not on file  Other Topics Concern   Not on file  Social History Narrative   Not on file   Social Determinants of Health   Financial Resource Strain: Not on file  Food Insecurity: Not on file  Transportation Needs: Not on file  Physical Activity: Not on file  Stress: Not on file  Social Connections: Not on file  Intimate Partner Violence: Not on file    Outpatient Medications Prior to Visit  Medication Sig Dispense Refill   Aspirin 81 MG CAPS Aspirin 81 mg     calcium carbonate 1250 MG capsule Take 1,250 mg by mouth 2 (two) times daily with a meal.     Cyanocobalamin (B-12 PO) Take by mouth.     folic acid (FOLVITE) 914 MCG tablet Take 400 mcg by mouth daily.     hydrOXYzine (ATARAX/VISTARIL) 10 MG tablet Take 1 tablet (10 mg total) by mouth 3 (three) times daily as needed for anxiety. 30 tablet 0   MAGNESIUM PO Take by mouth.     Multiple Vitamin (MULTIVITAMIN) tablet Take 1 tablet by mouth daily.       naproxen (NAPROSYN) 500 MG tablet TAKE 1 TABLET BY MOUTH 2 TIMES DAILY WITH A MEAL. 60 tablet 5   ondansetron (ZOFRAN) 4 MG tablet Take 1 tablet (4 mg total) by mouth every 8 (eight) hours as needed for nausea or vomiting. 20 tablet 0   prednisoLONE 5 MG TABS tablet Take by mouth.     PROAIR HFA 108 (90 Base) MCG/ACT inhaler  TAKE 2 PUFFS BY MOUTH EVERY 6 HOURS AS NEEDED FOR WHEEZE OR SHORTNESS OF BREATH 8.5 Inhaler 2   Probiotic Product (PROBIOTIC DAILY PO) Take by mouth daily.     TURMERIC PO Take by mouth.     escitalopram (LEXAPRO) 10 MG tablet Take 10 mg by mouth daily.     LORazepam (ATIVAN) 0.5 MG tablet Take 1 tablet (0.5 mg total) by mouth 2 (two) times daily as needed for anxiety. 30 tablet 1   omeprazole (PRILOSEC) 20 MG capsule Take 1 capsule (20 mg total) by mouth 2 (two) times daily before a meal. (Needs to be seen before next refill) 60 capsule 0   No facility-administered medications prior to visit.    Allergies  Allergen Reactions   Codeine Nausea And Vomiting    Review of Systems  Constitutional: Negative.   HENT: Negative.    Respiratory: Negative.    Cardiovascular: Negative.   Gastrointestinal: Negative.   Genitourinary: Negative.   Musculoskeletal:  Positive for joint swelling.  Skin:  Negative for rash.  All other systems reviewed and are negative.     Objective:    Physical Exam Vitals and nursing note reviewed.  Constitutional:      Appearance: Normal appearance.  HENT:     Head: Normocephalic.     Nose: No congestion.     Mouth/Throat:     Mouth: Mucous membranes are moist.     Pharynx: Oropharynx is clear.  Eyes:     Conjunctiva/sclera: Conjunctivae normal.  Cardiovascular:     Rate and Rhythm: Normal rate and regular rhythm.     Pulses: Normal pulses.     Heart sounds: Normal heart sounds.  Pulmonary:     Effort: Pulmonary effort is normal.     Breath sounds: Normal breath sounds.  Abdominal:     General: Bowel sounds are normal.  Musculoskeletal:     Left hand: Tenderness present.     Comments: Red, swollen, tenderness left ring finger.  Skin:    General: Skin is warm.     Findings: No rash.  Neurological:     Mental Status: She is alert and oriented to person, place, and time.  Psychiatric:  Behavior: Behavior normal.    BP 100/60 (BP  Location: Right Arm)    Pulse 62    Temp 98.5 F (36.9 C)    Ht 5' (1.524 m)    Wt 126 lb (57.2 kg)    SpO2 97%    BMI 24.61 kg/m  Wt Readings from Last 3 Encounters:  02/11/21 126 lb (57.2 kg)  11/21/20 130 lb (59 kg)  11/08/20 127 lb 12.8 oz (58 kg)    Health Maintenance Due  Topic Date Due   COLONOSCOPY (Pts 45-78yr Insurance coverage will need to be confirmed)  12/01/2015   PAP SMEAR-Modifier  10/22/2018   COVID-19 Vaccine (2 - Moderna series) 06/26/2019    There are no preventive care reminders to display for this patient.   Lab Results  Component Value Date   TSH 1.640 11/08/2020   Lab Results  Component Value Date   WBC 6.0 11/08/2020   HGB 13.9 11/08/2020   HCT 41.5 11/08/2020   MCV 93 11/08/2020   PLT 188 11/08/2020   Lab Results  Component Value Date   NA 140 11/08/2020   K 3.6 11/08/2020   CO2 25 11/08/2020   GLUCOSE 78 11/08/2020   BUN 9 11/08/2020   CREATININE 0.89 11/08/2020   BILITOT 0.4 11/08/2020   ALKPHOS 84 11/08/2020   AST 18 11/08/2020   ALT 13 11/08/2020   PROT 6.5 11/08/2020   ALBUMIN 4.4 11/08/2020   CALCIUM 9.6 11/08/2020   EGFR 73 11/08/2020   Lab Results  Component Value Date   CHOL 230 (H) 02/21/2018   Lab Results  Component Value Date   HDL 73 02/21/2018   Lab Results  Component Value Date   LDLCALC 123 (H) 02/21/2018   Lab Results  Component Value Date   TRIG 168 (H) 02/21/2018   Lab Results  Component Value Date   CHOLHDL 3.2 02/21/2018   No results found for: HGBA1C     Assessment & Plan:   Patient's finger presents with cellulitis. -Take medication as prescribed -Keflex 500 mg tablet by mouth daily -Patient knows to follow-up with worsening or unresolved symptoms.   Problem List Items Addressed This Visit   None Visit Diagnoses     Skin infection    -  Primary   Relevant Medications   cephALEXin (KEFLEX) 500 MG capsule        Meds ordered this encounter  Medications   cephALEXin (KEFLEX) 500  MG capsule    Sig: Take 1 capsule (500 mg total) by mouth 2 (two) times daily.    Dispense:  14 capsule    Refill:  0    Order Specific Question:   Supervising Provider    Answer:   SClaretta Fraise[[[id number]    OIvy Lynn NP

## 2021-02-20 ENCOUNTER — Ambulatory Visit: Payer: 59 | Admitting: Family Medicine

## 2021-02-25 NOTE — Progress Notes (Signed)
Office Visit Note  Patient: Katie [name]             Date of Birth: September 15, 1960           MRN: [id number]             PCP: Baruch Gouty, FNP Referring: Baruch Gouty, FNP Visit Date: 02/26/2021  Subjective:   History of Present Illness: Katie [name] is a 61 y.o. female here for seronegative RA. She saw Dr. Charlestine Night in 2017-2018 tried a few medications without much success but had good response to low dose prednisone. She did not tolerate methotrexate or leflunomide with liver function changes and on methotrexate also had significant hair loss. She saw Dr. Amil Amen once for this problem but did not follow up for any long term treatment. She is currently on prednisone 5 mg daily which is controlling symptoms mostly but has a lot of pain any time she comes off this. She has noticed some increase in forward bending of neck and also has a chronic enlarged right supraclavicular lymph node for years.  Bone densitometry from 2015 showing osteopenia t-score -1.4.  DMARD Hx MTX- LFT changes, alopecia LEF- LFT changes  Activities of Daily Living:  Patient reports morning stiffness for a few minutes.   Patient Reports nocturnal pain.  Difficulty dressing/grooming: Denies Difficulty climbing stairs: Denies Difficulty getting out of chair: Denies Difficulty using hands for taps, buttons, cutlery, and/or writing: Reports  Review of Systems  Constitutional:  Positive for fatigue.  HENT:  Positive for mouth dryness. Negative for mouth sores and nose dryness.   Eyes:  Positive for dryness. Negative for pain and itching.  Respiratory:  Negative for shortness of breath and difficulty breathing.   Cardiovascular:  Positive for palpitations. Negative for chest pain.  Gastrointestinal:  Negative for blood in stool, constipation and diarrhea.  Endocrine: Negative for increased urination.  Genitourinary:  Negative for difficulty urinating.  Musculoskeletal:  Positive for joint pain, joint pain,  joint swelling, myalgias, morning stiffness and myalgias. Negative for muscle tenderness.  Skin:  Negative for color change, rash and redness.  Allergic/Immunologic: Negative for susceptible to infections.  Neurological:  Positive for numbness and weakness. Negative for dizziness, headaches and memory loss.  Hematological:  Negative for bruising/bleeding tendency.  Psychiatric/Behavioral:  Negative for confusion.    PMFS History:  Patient Active Problem List   Diagnosis Date Noted   High risk medication use 02/26/2021   Dysuria 01/24/2020   Decreased libido 06/02/2018   Depression, recurrent ([place]) 05/27/2018   GAD (generalized anxiety disorder) 05/27/2018   Gastroesophageal reflux disease without esophagitis 05/27/2018   Anxiety and depression 05/27/2016   Hair loss 05/27/2016   RA (rheumatoid arthritis) ([place]) 05/27/2016    Past Medical History:  Diagnosis Date   Arthritis    RA   Asthma    no attack since childhood   Blood transfusion    Contact lens/glasses fitting    wears glasses or contacts   Cystocele    Depression    External hemorrhoids    Fibromyalgia    GERD (gastroesophageal reflux disease)    past hx    Glaucoma    RIGHT EYE   HPV (human papilloma virus) infection    Neuromuscular disorder ([place])    fibromyalgia   Syncope 2015   Pt questioned seizure with this episode - nothing like since - vaso vagal response    Wears dentures    top    Family History  Problem Relation Age of Onset   Hypertension Mother    Hypothyroidism Mother    Rheum arthritis Mother    Emphysema Father    Kidney disease Sister    Hypertension Sister    Bone cancer Daughter        Died at age 37   Celiac disease Daughter    Hypertension Daughter    Colon cancer Neg Hx    Colon polyps Neg Hx    Past Surgical History:  Procedure Laterality Date   ABDOMINAL EXPLORATION SURGERY  1984   bleed after hyst   APPENDECTOMY  1983   BLADDER  SUSPENSION  2010   BREAST ENHANCEMENT SURGERY  2000   CARPAL TUNNEL RELEASE  1995   rt   CARPAL TUNNEL RELEASE Left 12/09/2012   Procedure: LEFT LIMITED OPEN CARPAL TUNNEL RELEASE;  Surgeon: Roseanne Kaufman, MD;  Location: [facility];  Service: Orthopedics;  Laterality: Left;   COLONOSCOPY     CYSTOSCOPY     DIAGNOSTIC LAPAROSCOPY     MINI SHUNT INSERTION  10/14/2011   Procedure: INSERTION OF MINI SHUNT;  Surgeon: Marylynn Pearson, MD;  Location: [facility];  Service: Ophthalmology;  Laterality: Right;   POLYPECTOMY     [place]   Social History   Social History Narrative   Not on file   Immunization History  Administered Date(s) Administered   Influenza,inj,Quad PF,6+ Mos 11/08/2020   Influenza-Unspecified 11/07/2018   Moderna Sars-Covid-2 Vaccination 05/29/2019   Pneumococcal Conjugate-13 11/10/2018   Td 02/21/2018   Tdap 02/21/2018     Objective: Vital Signs: BP 118/70 (BP Location: Right Arm, Patient Position: Sitting, Cuff Size: Normal)    Pulse 63    Ht 5\' 1"  (1.549 m)    Wt 125 lb 12.8 oz (57.1 kg)    BMI 23.77 kg/m    Physical Exam Neck:     Comments: Right supraclavicular lymph node palpable and tender Cardiovascular:     Rate and Rhythm: Normal rate and regular rhythm.  Pulmonary:     Effort: Pulmonary effort is normal.     Breath sounds: Normal breath sounds.  Musculoskeletal:     Right lower leg: No edema.     Left lower leg: No edema.  Skin:    General: Skin is warm and dry.     Findings: No rash.  Neurological:     General: No focal deficit present.     Mental Status: She is alert.  Psychiatric:        Mood and Affect: Mood normal.     Musculoskeletal Exam:  Tenderness to pressure at base of the neck posteriorly at midline, normal ROM Shoulders full ROM no tenderness or swelling Elbows full ROM no tenderness or swelling Wrists full ROM no tenderness or swelling Fingers full ROM right  2nd and 3rd MCPs with chronic enlargement, 3rd digit with subluxation and lateral deviation with tenderness and mild swelling present Knees full ROM mild crepitus present Ankles full ROM no tenderness or swelling   CDAI Exam: CDAI Score: 11  Patient Global: 40 mm; Provider Global: 20 mm Swollen: 1 ; Tender: 4  Joint Exam 02/26/2021      Right  Left  MCP 2   Tender     MCP 3  Swollen Tender     PIP 2   Tender     PIP 3   Tender        Investigation: No additional findings.  Imaging: No results found.  Recent Labs: Lab Results  Component Value Date   WBC 5.8 02/26/2021   HGB 13.8 02/26/2021   PLT 180 02/26/2021   NA 142 02/26/2021   K 4.3 02/26/2021   CL 107 02/26/2021   CO2 28 02/26/2021   GLUCOSE 87 02/26/2021   BUN 11 02/26/2021   CREATININE 0.75 02/26/2021   BILITOT 0.4 02/26/2021   ALKPHOS 84 11/08/2020   AST 14 02/26/2021   ALT 10 02/26/2021   PROT 6.7 02/26/2021   ALBUMIN 4.4 11/08/2020   CALCIUM 10.0 02/26/2021   GFRAA 89 05/27/2018    Speciality Comments: No specialty comments available.  Procedures:  No procedures performed Allergies: Codeine   Assessment / Plan:     Visit Diagnoses: Rheumatoid arthritis involving both hands, unspecified whether rheumatoid factor present ([place]) - Plan: Sedimentation rate, C-reactive protein, Cyclic citrul peptide antibody, IgG  Symptoms do look consistent with RA although osteoarthritis changes in the affected joints could also be a large portion. Symptoms likely suppressed by current steroid treatment. I would ideally like to see her back after a short time off the steroids we can bridge her with some very short term pain medication if it is intolerable. Checking sed rate and CRP as well for activity assessment. Checking CCP Abs I do not see on file, RF was negative. Discussed plan would be for biologic DMARD treatment due to intolerance of previous trial of methotrexate and leflunomide.  High risk medication use -  Plan: QuantiFERON-TB Gold Plus, CBC with Differential/Platelet, COMPLETE METABOLIC PANEL WITH GFR, Hepatitis B core antibody, IgM, Hepatitis B surface antigen  Checking baseline labs anticipating biologic DMARD treatment hepatitis, quantiferon, CBC, and CMP today. Discussed risks of medication including injection site reactions, infections, malignancy.  Orders: Orders Placed This Encounter  Procedures   QuantiFERON-TB Gold Plus   CBC with Differential/Platelet   COMPLETE METABOLIC PANEL WITH GFR   Sedimentation rate   C-reactive protein   Cyclic citrul peptide antibody, IgG   Hepatitis B core antibody, IgM   Hepatitis B surface antigen   No orders of the defined types were placed in this encounter.    Follow-Up Instructions: Return in about 2 weeks (around 03/12/2021) for New pt RA f/u 2wks.   Collier Salina, MD  Note - This record has been created using [employer].  Chart creation errors have been sought, but may not always  have been located. Such creation errors do not reflect on  the standard of medical care.

## 2021-02-26 ENCOUNTER — Ambulatory Visit (INDEPENDENT_AMBULATORY_CARE_PROVIDER_SITE_OTHER): Payer: 59 | Admitting: Internal Medicine

## 2021-02-26 ENCOUNTER — Encounter: Payer: Self-pay | Admitting: Internal Medicine

## 2021-02-26 ENCOUNTER — Other Ambulatory Visit: Payer: Self-pay

## 2021-02-26 VITALS — BP 118/70 | HR 63 | Ht 61.0 in | Wt 125.8 lb

## 2021-02-26 DIAGNOSIS — M069 Rheumatoid arthritis, unspecified: Secondary | ICD-10-CM | POA: Diagnosis not present

## 2021-02-26 DIAGNOSIS — Z79899 Other long term (current) drug therapy: Secondary | ICD-10-CM | POA: Diagnosis not present

## 2021-03-01 LAB — CBC WITH DIFFERENTIAL/PLATELET
Absolute Monocytes: 423 cells/uL (ref 200–950)
Basophils Absolute: 17 cells/uL (ref 0–200)
Basophils Relative: 0.3 %
Eosinophils Absolute: 41 cells/uL (ref 15–500)
Eosinophils Relative: 0.7 %
HCT: 41.8 % (ref 35.0–45.0)
Hemoglobin: 13.8 g/dL (ref 11.7–15.5)
Lymphs Abs: 1560 cells/uL (ref 850–3900)
MCH: 30.9 pg (ref 27.0–33.0)
MCHC: 33 g/dL (ref 32.0–36.0)
MCV: 93.5 fL (ref 80.0–100.0)
MPV: 11.6 fL (ref 7.5–12.5)
Monocytes Relative: 7.3 %
Neutro Abs: 3758 cells/uL (ref 1500–7800)
Neutrophils Relative %: 64.8 %
Platelets: 180 10*3/uL (ref 140–400)
RBC: 4.47 10*6/uL (ref 3.80–5.10)
RDW: 12.5 % (ref 11.0–15.0)
Total Lymphocyte: 26.9 %
WBC: 5.8 10*3/uL (ref 3.8–10.8)

## 2021-03-01 LAB — COMPLETE METABOLIC PANEL WITH GFR
AG Ratio: 2 (calc) (ref 1.0–2.5)
ALT: 10 U/L (ref 6–29)
AST: 14 U/L (ref 10–35)
Albumin: 4.5 g/dL (ref 3.6–5.1)
Alkaline phosphatase (APISO): 65 U/L (ref 37–153)
BUN: 11 mg/dL (ref 7–25)
CO2: 28 mmol/L (ref 20–32)
Calcium: 10 mg/dL (ref 8.6–10.4)
Chloride: 107 mmol/L (ref 98–110)
Creat: 0.75 mg/dL (ref 0.50–1.05)
Globulin: 2.2 g/dL (calc) (ref 1.9–3.7)
Glucose, Bld: 87 mg/dL (ref 65–99)
Potassium: 4.3 mmol/L (ref 3.5–5.3)
Sodium: 142 mmol/L (ref 135–146)
Total Bilirubin: 0.4 mg/dL (ref 0.2–1.2)
Total Protein: 6.7 g/dL (ref 6.1–8.1)
eGFR: 89 mL/min/{1.73_m2} (ref >=60)

## 2021-03-01 LAB — QUANTIFERON-TB GOLD PLUS
Mitogen-NIL: >10 IU/mL
NIL: 0.02 IU/mL
QuantiFERON-TB Gold Plus: NEGATIVE
TB1-NIL: 0 IU/mL
TB2-NIL: 0 IU/mL

## 2021-03-01 LAB — CYCLIC CITRUL PEPTIDE ANTIBODY, IGG: Cyclic Citrullin Peptide Ab: <16 UNITS

## 2021-03-01 LAB — SEDIMENTATION RATE: Sed Rate: 2 mm/h (ref 0–30)

## 2021-03-01 LAB — C-REACTIVE PROTEIN: CRP: 0.5 mg/L (ref <8.0)

## 2021-03-01 LAB — HEPATITIS B CORE ANTIBODY, IGM: Hep B C IgM: NONREACTIVE

## 2021-03-01 LAB — HEPATITIS B SURFACE ANTIGEN: Hepatitis B Surface Ag: NONREACTIVE

## 2021-03-19 ENCOUNTER — Encounter: Payer: Self-pay | Admitting: Internal Medicine

## 2021-03-19 ENCOUNTER — Other Ambulatory Visit: Payer: Self-pay

## 2021-03-19 ENCOUNTER — Ambulatory Visit (INDEPENDENT_AMBULATORY_CARE_PROVIDER_SITE_OTHER): Payer: 59 | Admitting: Internal Medicine

## 2021-03-19 VITALS — BP 100/55 | HR 77 | Resp 15 | Ht 60.5 in | Wt 125.0 lb

## 2021-03-19 DIAGNOSIS — Z79899 Other long term (current) drug therapy: Secondary | ICD-10-CM | POA: Diagnosis not present

## 2021-03-19 DIAGNOSIS — M069 Rheumatoid arthritis, unspecified: Secondary | ICD-10-CM

## 2021-03-19 DIAGNOSIS — H5440 Blindness, one eye, unspecified eye: Secondary | ICD-10-CM | POA: Diagnosis not present

## 2021-03-19 MED ORDER — PREDNISONE 5 MG PO TABS: 5.0000 mg | ORAL_TABLET | Freq: Every day | ORAL | 1 refills | Status: DC

## 2021-03-19 NOTE — Patient Instructions (Signed)
Etanercept Injection ?What is this medication? ?ETANERCEPT (et a NER sept) treats autoimmune conditions, such as psoriasis and certain types of arthritis. It works by slowing down an overactive immune system. It belongs to a group of medications called TNF inhibitors. ?This medicine may be used for other purposes; ask your health care provider or pharmacist if you have questions. ?COMMON BRAND NAME(S): Enbrel ?What should I tell my care team before I take this medication? ?They need to know if you have any of these conditions: ?Bleeding disorder ?Cancer ?Diabetes ?Granulomatosis with polyangiitis ?Heart failure ?HIV or AIDS ?Immune system problems ?Infection such as tuberculosis (TB) or other bacterial, fungal or viral infections ?Liver disease ?Nervous system problems such as Guillain-Barre syndrome, multiple sclerosis or seizures ?Recent or upcoming vaccine ?An unusual or allergic reaction to etanercept, other medications, latex, rubber, food, dyes, or preservatives ?Pregnant or trying to get pregnant ?Breast-feeding ?How should I use this medication? ?The medication is injected under the skin. You will be taught how to prepare and give it. Take it as directed on the prescription label. Keep taking it unless your care team tells you stop. ?This medication comes with INSTRUCTIONS FOR USE. Ask your pharmacist for directions on how to use this medication. Read the information carefully. Talk to your pharmacist or care team if you have questions. ?If you use a pen, be sure to take off the outer needle cover before using the dose. ?It is important that you put your used needles and syringes in a special sharps container. Do not put them in a trash can. If you do not have a sharps container, call your pharmacist or care team to get one. ?A special MedGuide will be given to you by the pharmacist with each prescription and refill. Be sure to read this information carefully each time. ?Talk to your care team about the use  of this medication in children. While it may be prescribed for children as young as 1 years of age for selected conditions, precautions do apply. ?Overdosage: If you think you have taken too much of this medicine contact a poison control center or emergency room at once. ?NOTE: This medicine is only for you. Do not share this medicine with others. ?What if I miss a dose? ?If you miss a dose, take it as soon as you can. If it is almost time for your next dose, take only that dose. Do not take double or extra doses. ?What may interact with this medication? ?Do not take this medication with any of the following: ?Biologic medications such as adalimumab, certolizumab, golimumab, infliximab ?Live vaccines ?Rilonacept ?This medication may also interact with the following: ?Abatacept ?Anakinra ?Biologic medications such as anifrolumab, baricitinib, belimumab, canakinumab, natalizumab, rituximab, sarilumab, tocilizumab, tofacitinib, upadacitinib, vedolizumab ?Cyclophosphamide ?Sulfasalazine ?This list may not describe all possible interactions. Give your health care provider a list of all the medicines, herbs, non-prescription drugs, or dietary supplements you use. Also tell them if you smoke, drink alcohol, or use illegal drugs. Some items may interact with your medicine. ?What should I watch for while using this medication? ?Visit your care team for regular checks on your progress. Tell your care team if your symptoms do not start to get better or if they get worse. ?This medication may increase your risk of getting an infection. Call your care team for advice if you get a fever, chills, sore throat, or other symptoms of a cold or flu. Do not treat yourself. Try to avoid being around people who are  sick. If you have not had the measles or chickenpox vaccines, tell your care team right away if you are around someone with these viruses. ?You will be tested for tuberculosis (TB) before you start this medication. If your care  team prescribes any medication for TB, you should start taking the TB medication before starting this medication. Make sure to finish the full course of TB medication. ?Avoid taking medications that contain aspirin, acetaminophen, ibuprofen, naproxen, or ketoprofen unless instructed by your care team. These medications may hide fever. ?Talk to your care team about your risk of cancer. You may be more at risk for certain types of cancer if you take this medication. ?This medication can decrease the response to a vaccine. If you need to get vaccinated, tell your care team if you have received this medication. Extra booster doses may be needed. Talk to your care team to see if a different vaccination schedule is needed. ?What side effects may I notice from receiving this medication? ?Side effects that you should report to your care team as soon as possible: ?Allergic reactions--skin rash, itching, hives, swelling of the face, lips, tongue, or throat ?Body pain, tingling, or numbness ?Eye pain, change in vision, vision loss ?Heart failure--shortness of breath, swelling of the ankles, feet, or hands, sudden weight gain, unusual weakness or fatigue ?Infection--fever, chills, cough, sore throat, wounds that don't heal, pain or trouble when passing urine, general feeling of discomfort or being unwell ?Liver injury--right upper belly pain, loss of appetite, nausea, light-colored stool, dark yellow or brown urine, yellowing skin or eyes, unusual weakness or fatigue ?Low red blood cell level--unusual weakness or fatigue, dizziness, headache, trouble breathing ?Lupus-like syndrome--joint pain, swelling, or stiffness, butterfly-shaped rash on the face, rashes that get worse in the sun, fever, unusual weakness or fatigue ?New or worsening psoriasis--rash with itchy, scaly patches ?Seizures ?Unusual bruising or bleeding ?Weakness in arms and legs ?Side effects that usually do not require medical attention (report to your care team  if they continue or are bothersome): ?Headache ?Pain, redness, or irritation at injection site ?Sinus pain or pressure around the face or forehead ?This list may not describe all possible side effects. Call your doctor for medical advice about side effects. You may report side effects to FDA at 1-800-FDA-1088. ?Where should I keep my medication? ?Keep out of the reach of children and pets. ?See product for storage information. Each product may have different instructions. Get rid of any unused medication after the expiration date. ?To get rid of medications that are no longer needed or have expired: ?Take the medication to a medication take-back program. Check with your pharmacy or law enforcement to find a location. ?If you cannot return the medication, ask your pharmacist or care team how to get rid of this medication safely. ?NOTE: This sheet is a summary. It may not cover all possible information. If you have questions about this medicine, talk to your doctor, pharmacist, or health care provider. ?? 2022 Elsevier/Gold Standard (2020-08-15 00:00:00) ? ?

## 2021-03-19 NOTE — Progress Notes (Signed)
? ?Office Visit Note ? ?Patient: Katie [name]             ?Date of Birth: 28-Sep-1960           ?MRN: [id number]             ?PCP: Baruch Gouty, FNP ?Referring: Baruch Gouty, FNP ?Visit Date: 03/19/2021 ? ? ?Subjective:  ?Follow-up (Not doing good since she is not taking prednisone, total body pain, rash around neck) ? ? ?History of Present Illness: Katie [name] is a 61 y.o. 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 Labs were checked at initial visit planning for trying biologic DMARD due to intolerance of oral medications. She has increased joint pain in multiple areas since stopping the low dose prednisone about  weeks ago. Worst in neck and shoulders and in right hand. Also started having a faint skin rash around her neck and collar area in the past week. ? ?Previous HPI ?02/26/21 ?Katie [name] is a 61 y.o. female here for seronegative RA. She saw Dr. Charlestine Night in 2017-2018 tried a few medications without much success but had good response to low dose prednisone. She did not tolerate methotrexate or leflunomide with liver function changes and on methotrexate also had significant hair loss. She saw Dr. Amil Amen once for this problem but did not follow up for any long term treatment. She is currently on prednisone 5 mg daily which is controlling symptoms mostly but has a lot of pain any time she comes off this. She has noticed some increase in forward bending of neck and also has a chronic enlarged right supraclavicular lymph node for years.  ?Bone densitometry from 2015 showing osteopenia t-score -1.4. ?  ?DMARD Hx ?MTX- LFT changes, alopecia ?LEF- LFT changes ? ? ?Review of Systems  ?Constitutional:  Positive for fatigue.  ?HENT:  Positive for mouth dryness.   ?Eyes:  Positive for dryness.  ?Respiratory:  Negative for shortness of breath.   ?Cardiovascular:  Negative for swelling in legs/feet.  ?Gastrointestinal:  Negative for constipation.  ?Endocrine: Negative for  excessive thirst.  ?Genitourinary:  Negative for difficulty urinating.  ?Musculoskeletal:  Positive for joint pain, joint pain and morning stiffness.  ?Skin:  Positive for rash and nodules/bumps.  ?Allergic/Immunologic: Negative for susceptible to infections.  ?Neurological:  Positive for weakness.  ?Hematological:  Negative for bruising/bleeding tendency.  ?Psychiatric/Behavioral:  Positive for sleep disturbance.   ? ?PMFS History:  ?Patient Active Problem List  ? Diagnosis Date Noted  ? 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 High risk medication use 02/26/2021  ? Dysuria 01/24/2020  ? Decreased libido 06/02/2018  ? Depression, recurrent ([place]) 05/27/2018  ? GAD (generalized anxiety disorder) 05/27/2018  ? Gastroesophageal reflux disease without esophagitis 05/27/2018  ? Anxiety and depression 05/27/2016  ? Hair loss 05/27/2016  ? RA (rheumatoid arthritis) ([place]) 05/27/2016  ?  ?Past Medical History:  ?Diagnosis Date  ? Arthritis   ? RA  ? Asthma   ? no attack since childhood  ? Blood transfusion   ? Contact lens/glasses fitting   ? wears glasses or contacts  ? Cystocele   ? Depression   ? External hemorrhoids   ? Fibromyalgia   ? GERD (gastroesophageal reflux disease)   ? past hx   ? Glaucoma   ? RIGHT EYE  ? HPV (human papilloma virus) infection   ? Neuromuscular disorder ([place])   ? fibromyalgia  ?  Syncope 2015  ? Pt questioned seizure with this episode - nothing like since - vaso vagal response   ? Wears dentures   ? top  ?  ?Family History  ?Problem Relation Age of Onset  ? Hypertension Mother   ? Hypothyroidism Mother   ? Rheum arthritis Mother   ? Emphysema Father   ? Kidney disease Sister   ? Hypertension Sister   ? Bone cancer Daughter   ?     Died at age 48  ? Celiac disease Daughter   ? Hypertension Daughter   ? Colon cancer Neg Hx   ? Colon polyps Neg Hx   ? ?Past Surgical History:  ?Procedure Laterality Date  ? ABDOMINAL EXPLORATION SURGERY  1984  ? bleed after hyst  ?  APPENDECTOMY  1983  ? BLADDER SUSPENSION  2010  ? BREAST ENHANCEMENT SURGERY  2000  ? [place]  ? rt  ? CARPAL TUNNEL RELEASE Left 12/09/2012  ? Procedure: LEFT LIMITED OPEN CARPAL TUNNEL RELEASE;  Surgeon: Roseanne Kaufman, MD;  Location: [facility];  Service: Orthopedics;  Laterality: Left;  ? COLONOSCOPY    ? CYSTOSCOPY    ? DIAGNOSTIC LAPAROSCOPY    ? MINI SHUNT INSERTION  10/14/2011  ? Procedure: INSERTION OF MINI SHUNT;  Surgeon: Marylynn Pearson, MD;  Location: [facility];  Service: Ophthalmology;  Laterality: Right;  ? POLYPECTOMY    ? Tubaligation  1980  ? VAGINAL HYSTERECTOMY  1983  ? ?Social History  ? ?Social History Narrative  ? Not on file  ? ?Immunization History  ?Administered Date(s) Administered  ? Influenza,inj,Quad PF,6+ Mos 11/08/2020  ? Influenza-Unspecified 11/07/2018  ? Moderna Sars-Covid-2 Vaccination 05/29/2019  ? Pneumococcal Conjugate-13 11/10/2018  ? Td 02/21/2018  ? Tdap 02/21/2018  ?  ? ?Objective: ?Vital Signs: BP (!) 100/55 (BP Location: Left Arm, Patient Position: Sitting, Cuff Size: Normal)   Pulse 77   Resp 15   Ht 5' 0.5" (1.537 m)   Wt 125 lb (56.7 kg)   BMI 24.01 kg/m?   ? ?Physical Exam ?Skin: ?   General: Skin is warm and dry.  ?   Comments: Faintly erythematous patchy rash around base of neck anteriorly and on both sides  ?Neurological:  ?   Mental Status: She is alert.  ?Psychiatric:     ?   Mood and Affect: Mood normal.  ?  ? ?Musculoskeletal Exam:  ?Shoulder ROM intact with tenderness to pressure b/l ?Elbows full ROM no tenderness or swelling ?Wrists full ROM no tenderness or swelling ?Right hand chronic MCP enlargement with subluxation, mild swelling in 3rd MCP tenderness in 2nd-3rd MCPs ?Knees full ROM no tenderness or swelling ? ? ?CDAI Exam: ?CDAI Score: 12  ?Patient Global: 40 mm; Provider Global: 30 mm ?Swollen: 1 ; Tender: 4  ?Joint Exam 03/19/2021  ? ?   Right  Left  ?Glenohumeral   Tender   Tender  ?MCP 2   Tender     ?MCP 3  Swollen  Tender     ? ? ? ?Investigation: ?No additional findings. ? ?Imaging: ?No results found. ? ?Recent Labs: ?Lab Results  ?Component Value Date  ? WBC 5.8 02/26/2021  ? HGB 13.8 02/26/2021  ? PLT 180 02/26/2021  ? NA 142 02/26/2021  ? K 4.3 02/26/2021  ? CL 107 02/26/2021  ? CO2 28 02/26/2021  ? GLUCOSE 87 02/26/2021  ? BUN 11 02/26/2021  ? CREATININE 0.75 02/26/2021  ? BILITOT 0.4 02/26/2021  ?  ALKPHOS 84 11/08/2020  ? AST 14 02/26/2021  ? ALT 10 02/26/2021  ? PROT 6.7 02/26/2021  ? ALBUMIN 4.4 11/08/2020  ? CALCIUM 10.0 02/26/2021  ? GFRAA 89 05/27/2018  ? QFTBGOLDPLUS NEGATIVE 02/26/2021  ? ? ?Speciality Comments: No specialty comments available. ? ?Procedures:  ?No procedures performed ?Allergies: Codeine  ? ?Assessment / Plan:     ?Visit Diagnoses: Rheumatoid arthritis involving both hands, unspecified whether rheumatoid factor present ([place]) - Plan: predniSONE (DELTASONE) 5 MG tablet ? ?Joint pain worse compared to last visit only has synovitis in a limited distribution compared to overall painful areas. Moderate activity, shoulders and right hand worst. Intolerant for methotrexate or leflunomide. Plan to start TNF inhibitor probably Enbrel first choice as monotherapy. She cam take the prednisone 5 mg daily for now at least until starting longer term medication. ? ?High risk medication use ? ?Baseline labs look okay. Discussed risks for TNF inhibitor treatment including injection site reactions, infections, malignancy. ? 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 ?Not an ideal candidate for HCQ treatment with existing significant eye disease likely to limit visual field testing. ? ?Orders: ?No orders of the defined types were placed in this encounter. ? ?Meds ordered this encounter  ?Medications  ? predniSONE (DELTASONE) 5 MG tablet  ?  Sig: Take 1 tablet (5 mg total) by mouth daily with breakfast.  ?  Dispense:  30 tablet  ?  Refill:  1  ? ? ? ?Follow-Up Instructions: Return in about 2  months (around 05/19/2021) for RA ENB start f/u 60mo. ? ? ?CCollier Salina MD ? ?Note - This record has been created using [employer]  ?Chart creation errors have been sought, but may not always  ?have been

## 2021-03-21 ENCOUNTER — Ambulatory Visit: Payer: 59 | Admitting: Family Medicine

## 2021-04-03 ENCOUNTER — Telehealth: Payer: Self-pay | Admitting: Internal Medicine

## 2021-04-03 NOTE — Telephone Encounter (Signed)
Patient left a voicemail checking on the status of her Enbrel PA approval. [phone] ?

## 2021-04-04 NOTE — Telephone Encounter (Signed)
Submitted an URGENT Prior Authorization request to  [employer]  for HUMIRA via CoverMyMeds. Will update once we receive a response. ? ?Key: [id number] ? ?Knox Saliva, PharmD, MPH, BCPS ?Clinical Pharmacist (Rheumatology and Pulmonology) ? ? ?

## 2021-04-04 NOTE — Telephone Encounter (Signed)
It is finished. I have no problems to proceed with Humira as alternative if preferred over Enbrel.

## 2021-04-07 NOTE — Telephone Encounter (Signed)
Received fax from [facility] requesting additional information about tuberculosis status. Faxed additional information ? ?Fax: [phone] ?Phone: [phone] ?Case ID: [id number] ? ?Knox Saliva, PharmD, MPH, BCPS ?Clinical Pharmacist (Rheumatology and Pulmonology) ?

## 2021-04-07 NOTE — Telephone Encounter (Signed)
Received notification from  [employer]  regarding a prior authorization for HUMIRA. Authorization has been APPROVED from 04/04/21 to 04/05/22.  ? ?Unable to run test claim as patient must fill through  [place] ? ?Authorization # [id number] ? ?Awaiting copay card information to populate in [employer] Complete Pro portal ? ?Patient scheduled for Humira new start on Thursday, 04/10/21 ? ?Knox Saliva, PharmD, MPH, BCPS ?Clinical Pharmacist (Rheumatology and Pulmonology) ?

## 2021-04-08 NOTE — Telephone Encounter (Signed)
Humira copay card infomration populated in Complete Pro portal: ?ID: [id number] ?Rx GROUP: [id number] ?Rx BIN: [id number] ?Rx PCN: [id number] ?Issued: 04/07/2021 ? ?Will provide to patient at new start visit on 04/10/21 ? ?Knox Saliva, PharmD, MPH, BCPS ?Clinical Pharmacist (Rheumatology and Pulmonology) ?

## 2021-04-10 ENCOUNTER — Ambulatory Visit (INDEPENDENT_AMBULATORY_CARE_PROVIDER_SITE_OTHER): Payer: 59 | Admitting: Pharmacist

## 2021-04-10 VITALS — BP 114/61 | HR 61

## 2021-04-10 DIAGNOSIS — Z79899 Other long term (current) drug therapy: Secondary | ICD-10-CM

## 2021-04-10 DIAGNOSIS — M069 Rheumatoid arthritis, unspecified: Secondary | ICD-10-CM

## 2021-04-10 DIAGNOSIS — Z7189 Other specified counseling: Secondary | ICD-10-CM

## 2021-04-10 MED ORDER — HUMIRA (2 PEN) 40 MG/0.4ML ~~LOC~~ AJKT: 40.0000 mg | AUTO-INJECTOR | SUBCUTANEOUS | 0 refills | Status: DC

## 2021-04-10 NOTE — Progress Notes (Signed)
Pharmacy Note ? ?Subjective:   ?Patient presents to clinic today to receive first dose of Humira for RA. Patient previously tried MTX and leflunomide with increase in LFTs. She has hair loss while on MTX. Currently taking daily prednisone. ? ?Patient running a fever or have signs/symptoms of infection? No ? ?Patient currently on antibiotics for the treatment of infection? No ? ?Patient have any upcoming invasive procedures/surgeries? No ? ?Objective: ?CMP  ?   ?Component Value Date/Time  ? NA 142 02/26/2021 0905  ? NA 140 11/08/2020 0918  ? K 4.3 02/26/2021 0905  ? CL 107 02/26/2021 0905  ? CO2 28 02/26/2021 0905  ? GLUCOSE 87 02/26/2021 0905  ? BUN 11 02/26/2021 0905  ? BUN 9 11/08/2020 0918  ? CREATININE 0.75 02/26/2021 0905  ? CALCIUM 10.0 02/26/2021 0905  ? PROT 6.7 02/26/2021 0905  ? PROT 6.5 11/08/2020 0918  ? ALBUMIN 4.4 11/08/2020 0918  ? AST 14 02/26/2021 0905  ? ALT 10 02/26/2021 0905  ? ALKPHOS 84 11/08/2020 0918  ? BILITOT 0.4 02/26/2021 0905  ? BILITOT 0.4 11/08/2020 0918  ? GFRNONAA 77 05/27/2018 1445  ? GFRAA 89 05/27/2018 1445  ? ? ?CBC ?   ?Component Value Date/Time  ? WBC 5.8 02/26/2021 0905  ? RBC 4.47 02/26/2021 0905  ? HGB 13.8 02/26/2021 0905  ? HGB 13.9 11/08/2020 0918  ? HCT 41.8 02/26/2021 0905  ? HCT 41.5 11/08/2020 0918  ? PLT 180 02/26/2021 0905  ? PLT 188 11/08/2020 0918  ? MCV 93.5 02/26/2021 0905  ? MCV 93 11/08/2020 0918  ? MCH 30.9 02/26/2021 0905  ? MCHC 33.0 02/26/2021 0905  ? RDW 12.5 02/26/2021 0905  ? RDW 13.1 11/08/2020 0918  ? LYMPHSABS 1,560 02/26/2021 0905  ? LYMPHSABS 1.6 11/08/2020 0918  ? EOSABS 41 02/26/2021 0905  ? EOSABS 0.1 11/08/2020 0918  ? BASOSABS 17 02/26/2021 0905  ? BASOSABS 0.0 11/08/2020 0918  ? ? ?Baseline Immunosuppressant Therapy Labs ?TB GOLD ? ?  Latest Ref Rng & Units 02/26/2021  ?  9:05 AM  ?Quantiferon TB Gold  ?Quantiferon TB Gold Plus NEGATIVE NEGATIVE    ? ?Hepatitis Panel ? ?  Latest Ref Rng & Units 02/26/2021  ?  9:05 AM  ?Hepatitis  ?Hep B Surface  Ag NON-REACTIVE NON-REACTIVE    ?Hep B IgM NON-REACTIVE NON-REACTIVE    ? ?HIV ?Lab Results  ?Component Value Date  ? HIV Non Reactive 02/21/2018  ? ?Immunoglobulins ?  ?SPEP ? ?  Latest Ref Rng & Units 02/26/2021  ?  9:05 AM  ?Serum Protein Electrophoresis  ?Total Protein 6.1 - 8.1 g/dL 6.7    ? ?Chest x-ray: 01/12/19  - Radiographic findings correlate with bilateral upper rib non ?segmentation. No bone lesion or other worrisome finding. ? ?Assessment/Plan:  ?Demonstrated proper injection technique with Humira demo device  Patient able to demonstrate proper injection technique using the teach back method.  Patient self injected in the right thigh with: ? ?Sample Medication: Humira '40mg'$ /0.4 ml autoinjector pen ?[place]: [phone] ?Lot: [id number] ?Expiration: 04/2022 ? ?Patient tolerated well.  Observed for 30 mins in office for adverse reaction and none noted.  ? ?Patient is to return in 1 month for labs and 6-8 weeks for follow-up appointment.  Standing orders placed. ? ?Patients gets yearly skin checks with Dr. Jarvis Newcomer in [place].  She has been advised to continue yearly skin checks while on TNF inhibitor due to rare but possible risk for non-melanoma skin cancer. She verbalized understanding. ? ?  Humira approved through insurance .   Rx sent to:  [facility] .  Patient provided with pharmacy phone number, copay card information, and advised to call later this week to schedule shipment to home. ? ?She will continue Humira 40g SQ every 14 days. Next dose due 4/20/023 ? ?All questions encouraged and answered.  Instructed patient to call with any further questions or concerns. ? ?Knox Saliva, PharmD, MPH, BCPS ?Clinical Pharmacist (Rheumatology and Pulmonology) ? ?04/10/2021 9:50 AM ?

## 2021-04-10 NOTE — Patient Instructions (Signed)
Your next HUMIRA dose is due on 04/24/21, 05/08/21, and every 14 days thereafter ? ?HOLD HUMIRA if you have signs or symptoms of an infection. You can resume once you feel better or back to your baseline. ?HOLD HUMIRA if you start antibiotics to treat an infection. ?HOLD HUMIRA around the time of surgery/procedures. Your surgeon will be able to provide recommendations on when to hold BEFORE and when you are cleared to [place]. ? ?Pharmacy information: ?Your prescription will be shipped from [facility]. ?Their phone number is [phone] ?Please call to schedule shipment, provide copay card information, and confirm address. They will mail your medication to your home. ? ?Cost information: ?Your copay should be affordable. If you call the pharmacy and it is not affordable, please double-check that they are billing through your copay card as secondary coverage. ?That copay card information is: ?ID: [id number] ?Rx GROUP: [id number] ?Rx BIN: [id number] ?Rx PCN: [id number] ? ?Labs are due in 1 month then every 3 months. ?Lab hours are from Monday to Thursday 1:30-4:30pm and Friday 1:30-4pm. You do not need an appointment if you come for labs during these times. ? ?How to manage an injection site reaction: ?Remember the 5 C's: ?COUNTER - leave on the counter at least 30 minutes but up to overnight to bring medication to room temperature. This may help prevent stinging ?COLD - place something cold (like an ice gel pack or cold water bottle) on the injection site just before cleansing with alcohol. This may help reduce pain ?CLARITIN - use Claritin (generic name is loratadine) for the first two weeks of treatment or the day of, the day before, and the day after injecting. This will help to minimize injection site reactions ?CORTISONE CREAM - apply if injection site is irritated and itching ?CALL ME - if injection site reaction is bigger than the size of your fist, looks infected, blisters, or if you develop hives ? ?

## 2021-05-11 ENCOUNTER — Other Ambulatory Visit: Payer: Self-pay | Admitting: Internal Medicine

## 2021-05-11 DIAGNOSIS — M069 Rheumatoid arthritis, unspecified: Secondary | ICD-10-CM

## 2021-05-12 NOTE — Progress Notes (Signed)
Office Visit Note  Patient: Katie [name]             Date of Birth: September 26, 1960           MRN: [id number]             PCP: Baruch Gouty, FNP Referring: Baruch Gouty, FNP Visit Date: 05/26/2021   Subjective:  Other (Patient reports side effects since starting humira, nausea at first. After taking humira for approximately 3 weeks, she started experiencing palpitations and flutters which are continuous. )   History of Present Illness: Katie [name] is a 61 y.o. 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 Humira new start 04/10/2021.  Symptoms slightly improved with the medication so far still has soreness in right hand MCPs main affected area. After about 3 weeks since starting the medication she has noticed an increase in symptoms she feels are similar to palpitations or a panic attack and like there is pressure over the upper part of her chest.  These come and go within a few minutes but have been occurring pretty much daily for weeks.  She has used her husband's A-fib monitor to check and not seen any obvious rhythm abnormality testing at home.  She tried taking hydroxyzine as needed as previously prescribed for her did not notice any symptom improvement just sedation.  Previous HPI 03/19/2021 [name] is a 61 y.o. 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 Labs were checked at initial visit planning for trying biologic DMARD due to intolerance of oral medications. She has increased joint pain in multiple areas since stopping the low dose prednisone weeks ago. Worst in neck and shoulders and in right hand. Also started having a faint skin rash around her neck and collar area in the past week.   Previous HPI 02/26/21 [name] is a 61 y.o. female here for seronegative RA. She saw Dr. Charlestine Night in 2017-2018 tried a few medications without much success but had good response to low dose prednisone. She did not  tolerate methotrexate or leflunomide with liver function changes and on methotrexate also had significant hair loss. She saw Dr. Amil Amen once for this problem but did not follow up for any long term treatment. She is currently on prednisone 5 mg daily which is controlling symptoms mostly but has a lot of pain any time she comes off this. She has noticed some increase in forward bending of neck and also has a chronic enlarged right supraclavicular lymph node for years.  Bone densitometry from 2015 showing osteopenia t-score -1.4.   DMARD Hx MTX- LFT changes, alopecia LEF- LFT changes   Review of Systems  Constitutional:  Positive for fatigue.  HENT:  Negative for mouth sores, mouth dryness and nose dryness.   Eyes:  Positive for dryness. Negative for pain and itching.  Respiratory:  Negative for shortness of breath and difficulty breathing.   Cardiovascular:  Positive for palpitations. Negative for chest pain.  Gastrointestinal:  Positive for nausea. Negative for blood in stool, constipation and diarrhea.  Endocrine: Negative for increased urination.  Genitourinary:  Negative for difficulty urinating.  Musculoskeletal:  Positive for joint pain, joint pain and morning stiffness. Negative for joint swelling, myalgias, muscle tenderness and myalgias.  Skin:  Negative for color change, rash and redness.  Allergic/Immunologic: Negative for susceptible to infections.  Neurological:  Positive for dizziness and headaches. Negative for numbness, memory loss and  weakness.  Hematological:  Negative for bruising/bleeding tendency.  Psychiatric/Behavioral:  Negative for confusion.    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 03/19/2021   High risk medication use 02/26/2021   Dysuria 01/24/2020   Decreased libido 06/02/2018   Depression, recurrent ([place]) 05/27/2018   GAD (generalized anxiety disorder) 05/27/2018    Gastroesophageal reflux disease without esophagitis 05/27/2018   Anxiety and depression 05/27/2016   Hair loss 05/27/2016   RA (rheumatoid arthritis) ([place]) 05/27/2016    Past Medical History:  Diagnosis Date   Arthritis    RA   Asthma    no attack since childhood   Blood transfusion    Contact lens/glasses fitting    wears glasses or contacts   Cystocele    Depression    External hemorrhoids    Fibromyalgia    GERD (gastroesophageal reflux disease)    past hx    Glaucoma    RIGHT EYE   HPV (human papilloma virus) infection    Neuromuscular disorder ([place])    fibromyalgia   Syncope 2015   Pt questioned seizure with this episode - nothing like since - vaso vagal response    Wears dentures    top    Family History  Problem Relation Age of Onset   Hypertension Mother    Hypothyroidism Mother    Rheum arthritis Mother    Emphysema Father    Kidney disease Sister    Hypertension Sister    Bone cancer Daughter        Died at age 96   Celiac disease Daughter    Hypertension Daughter    Colon cancer Neg Hx    Colon polyps Neg Hx    Past Surgical History:  Procedure Laterality Date   ABDOMINAL EXPLORATION SURGERY  1984   bleed after hyst   APPENDECTOMY  1983   BLADDER SUSPENSION  2010   BREAST ENHANCEMENT SURGERY  2000   CARPAL TUNNEL RELEASE  1995   rt   CARPAL TUNNEL RELEASE Left 12/09/2012   Procedure: LEFT LIMITED OPEN CARPAL TUNNEL RELEASE;  Surgeon: Roseanne Kaufman, MD;  Location: [facility];  Service: Orthopedics;  Laterality: Left;   COLONOSCOPY     CYSTOSCOPY     DIAGNOSTIC LAPAROSCOPY     MINI SHUNT INSERTION  10/14/2011   Procedure: INSERTION OF MINI SHUNT;  Surgeon: Marylynn Pearson, MD;  Location: [facility];  Service: Ophthalmology;  Laterality: Right;   POLYPECTOMY     [place]   Social History   Social History Narrative   Not on file   Immunization History  Administered Date(s) Administered    Influenza,inj,Quad PF,6+ Mos 11/08/2020   Influenza-Unspecified 11/07/2018   Moderna Sars-Covid-2 Vaccination 05/29/2019   Pneumococcal Conjugate-13 11/10/2018   Td 02/21/2018   Tdap 02/21/2018     Objective: Vital Signs: BP 104/66 (BP Location: Left Arm, Patient Position: Sitting, Cuff Size: Normal)   Pulse 66   Ht 5' 0.5" (1.537 m)   Wt 118 lb 6.4 oz (53.7 kg)   BMI 22.74 kg/m    Physical Exam Eyes:     Conjunctiva/sclera: Conjunctivae normal.  Cardiovascular:     Rate and Rhythm: Normal rate and regular rhythm.  Pulmonary:     Effort: Pulmonary effort is normal.     Breath sounds: Normal breath sounds.  Musculoskeletal:  Right lower leg: No edema.     Left lower leg: No edema.  Skin:    General: Skin is warm and dry.     Findings: No rash.  Neurological:     Mental Status: She is alert.  Psychiatric:        Mood and Affect: Mood normal.     Musculoskeletal Exam:  Shoulders full ROM no tenderness or swelling Elbows full ROM no tenderness or swelling Wrists full ROM no tenderness or swelling Mild Heberden's nodes in fingers, right second third MCP joint enlargement with tenderness to pressure no palpable swelling Knees full ROM no tenderness or swelling, patellofemoral crepitus present Ankles full ROM no tenderness or swelling   CDAI Exam: CDAI Score: 6  Patient Global: 30 mm; Provider Global: 10 mm Swollen: 0 ; Tender: 2  Joint Exam 05/26/2021      Right  Left  MCP 2   Tender     MCP 3   Tender        Investigation: No additional findings.  Imaging: No results found.  Recent Labs: Lab Results  Component Value Date   WBC 5.8 02/26/2021   HGB 13.8 02/26/2021   PLT 180 02/26/2021   NA 142 02/26/2021   K 4.3 02/26/2021   CL 107 02/26/2021   CO2 28 02/26/2021   GLUCOSE 87 02/26/2021   BUN 11 02/26/2021   CREATININE 0.75 02/26/2021   BILITOT 0.4 02/26/2021   ALKPHOS 84 11/08/2020   AST 14 02/26/2021   ALT 10 02/26/2021   PROT 6.7  02/26/2021   ALBUMIN 4.4 11/08/2020   CALCIUM 10.0 02/26/2021   GFRAA 89 05/27/2018   QFTBGOLDPLUS NEGATIVE 02/26/2021    Speciality Comments: MTX stopped due to elevated LFTs/hairloss; leflunomide stopped due to elevated LFTs, Humira started 04/10/21  Procedures:  No procedures performed Allergies: Codeine   Assessment / Plan:     Visit Diagnoses: Rheumatoid arthritis involving both hands, unspecified whether rheumatoid factor present ([place])  Appears to be low disease activity not much appreciable swelling on exam does have joint tenderness worst in the right hand MCP joints.  Recommend she continue the prednisone 5 mg daily will be temporarily interrupt her Humira see if this is contributing to side effect problems.  I think that is less likely, and if symptoms do not resolve with changing dose would plan to resume in 1 month.  High risk medication use - humira '40mg'$  subcutaneously every 14 days and prednisone '5mg'$  po qd.  - Plan: CBC with Differential/Platelet, COMPLETE METABOLIC PANEL WITH GFR  Checking CBC and CMP for Humira monitoring after new drug start.  She is not reporting any localized or systemic skin reactions.  No signs or symptoms of heart failure rhythm and circulation appear normal on exam today.  I recommend she can try interrupting the Humira treatment for a month with the symptoms improve may benefit to try different medication if symptoms do not improve can consider referral to cardiology.  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    Orders: Orders Placed This Encounter  Procedures   CBC with Differential/Platelet   COMPLETE METABOLIC PANEL WITH GFR   No orders of the defined types were placed in this encounter.    Follow-Up Instructions: Return in about 3 months (around 08/26/2021) for RA on GC/?ADA f/u 23mo.   CCollier Salina MD  Note - This record has been created using [employer].  Chart creation errors have been sought, but may not always  have been located. Such creation errors do not reflect on  the standard of medical care.

## 2021-05-19 ENCOUNTER — Ambulatory Visit: Payer: 59 | Admitting: Internal Medicine

## 2021-05-26 ENCOUNTER — Ambulatory Visit (INDEPENDENT_AMBULATORY_CARE_PROVIDER_SITE_OTHER): Payer: 59 | Admitting: Internal Medicine

## 2021-05-26 ENCOUNTER — Encounter: Payer: Self-pay | Admitting: Internal Medicine

## 2021-05-26 VITALS — BP 104/66 | HR 66 | Ht 60.5 in | Wt 118.4 lb

## 2021-05-26 DIAGNOSIS — H5440 Blindness, one eye, unspecified eye: Secondary | ICD-10-CM

## 2021-05-26 DIAGNOSIS — M069 Rheumatoid arthritis, unspecified: Secondary | ICD-10-CM | POA: Diagnosis not present

## 2021-05-26 DIAGNOSIS — Z79899 Other long term (current) drug therapy: Secondary | ICD-10-CM

## 2021-05-27 LAB — CBC WITH DIFFERENTIAL/PLATELET
Absolute Monocytes: 540 cells/uL (ref 200–950)
Basophils Absolute: 22 cells/uL (ref 0–200)
Basophils Relative: 0.4 %
Eosinophils Absolute: 130 cells/uL (ref 15–500)
Eosinophils Relative: 2.4 %
HCT: 41.5 % (ref 35.0–45.0)
Hemoglobin: 13.7 g/dL (ref 11.7–15.5)
Lymphs Abs: 1760 cells/uL (ref 850–3900)
MCH: 30.2 pg (ref 27.0–33.0)
MCHC: 33 g/dL (ref 32.0–36.0)
MCV: 91.4 fL (ref 80.0–100.0)
MPV: 11.5 fL (ref 7.5–12.5)
Monocytes Relative: 10 %
Neutro Abs: 2948 cells/uL (ref 1500–7800)
Neutrophils Relative %: 54.6 %
Platelets: 175 10*3/uL (ref 140–400)
RBC: 4.54 10*6/uL (ref 3.80–5.10)
RDW: 13.1 % (ref 11.0–15.0)
Total Lymphocyte: 32.6 %
WBC: 5.4 10*3/uL (ref 3.8–10.8)

## 2021-05-27 LAB — COMPLETE METABOLIC PANEL WITH GFR
AG Ratio: 1.6 (calc) (ref 1.0–2.5)
ALT: 11 U/L (ref 6–29)
AST: 15 U/L (ref 10–35)
Albumin: 4.2 g/dL (ref 3.6–5.1)
Alkaline phosphatase (APISO): 68 U/L (ref 37–153)
BUN: 13 mg/dL (ref 7–25)
CO2: 29 mmol/L (ref 20–32)
Calcium: 9.8 mg/dL (ref 8.6–10.4)
Chloride: 106 mmol/L (ref 98–110)
Creat: 0.78 mg/dL (ref 0.50–1.05)
Globulin: 2.6 g/dL (calc) (ref 1.9–3.7)
Glucose, Bld: 82 mg/dL (ref 65–99)
Potassium: 4 mmol/L (ref 3.5–5.3)
Sodium: 141 mmol/L (ref 135–146)
Total Bilirubin: 0.3 mg/dL (ref 0.2–1.2)
Total Protein: 6.8 g/dL (ref 6.1–8.1)
eGFR: 85 mL/min/{1.73_m2} (ref >=60)

## 2021-05-27 NOTE — Progress Notes (Signed)
Lab results are all completely normal on the Humira. She should let us know how she feels with trying interrupting the medication as we discussed.

## 2021-06-30 ENCOUNTER — Encounter: Payer: Self-pay | Admitting: Nurse Practitioner

## 2021-06-30 ENCOUNTER — Ambulatory Visit (INDEPENDENT_AMBULATORY_CARE_PROVIDER_SITE_OTHER): Payer: 59 | Admitting: Nurse Practitioner

## 2021-06-30 VITALS — BP 106/68 | HR 65 | Temp 97.7°F | Ht 60.0 in | Wt 118.0 lb

## 2021-06-30 DIAGNOSIS — H9201 Otalgia, right ear: Secondary | ICD-10-CM

## 2021-06-30 DIAGNOSIS — J0100 Acute maxillary sinusitis, unspecified: Secondary | ICD-10-CM

## 2021-06-30 MED ORDER — AMOXICILLIN-POT CLAVULANATE 875-125 MG PO TABS: 1.0000 | ORAL_TABLET | Freq: Two times a day (BID) | ORAL | 0 refills | Status: DC

## 2021-07-02 ENCOUNTER — Telehealth: Payer: Self-pay | Admitting: Family Medicine

## 2021-07-02 NOTE — Telephone Encounter (Signed)
I advised patient of provider recommendations, she states she needs some type of antibiotic drop or pain reliever. Patient states that she has not been able to get rid of the pain. She states that she needs some type of relief, has had heating pad on ear all day. I advised patient that if pain is that intensive she could go to ER or urgent care. Patient hung up on me.

## 2021-07-02 NOTE — Telephone Encounter (Signed)
Patient was seen on 6/26 for earache, she said she was given antibiotics and it is starting to drain but is still hurting. She wants to know if ear drops can be called in to [facility] in [place]. Please call back and advise.

## 2021-07-02 NOTE — Telephone Encounter (Signed)
Please complete current antibiotics to treat ear infection.  tylenol or ibuprofen for pain

## 2021-07-04 NOTE — Telephone Encounter (Signed)
I agree. Thank you.

## 2021-07-13 ENCOUNTER — Other Ambulatory Visit: Payer: Self-pay | Admitting: Internal Medicine

## 2021-07-13 DIAGNOSIS — M069 Rheumatoid arthritis, unspecified: Secondary | ICD-10-CM

## 2021-07-14 NOTE — Telephone Encounter (Signed)
LMOM to schedule follow-up appointment 

## 2021-07-14 NOTE — Telephone Encounter (Signed)
Please schedule patient a follow up visit. Patient due August 2023. Thanks!

## 2021-07-14 NOTE — Telephone Encounter (Signed)
Next Visit: Due August 2023. Message sent to the front to schedule.   Last Visit: 05/26/2021  Last Fill: 05/11/2021  Dx: Rheumatoid arthritis involving both hands, unspecified whether rheumatoid factor present   Current Dose per office note on 05/26/2021:  prednisone '5mg'$  po qd  Okay to refill Prednisone?

## 2021-08-27 IMAGING — DX DG SHOULDER 2+V*L*
3 series · 3 of 3 positions shown · non-contrast
Comparison: None.

CLINICAL DATA: Left shoulder pain.

EXAM:
LEFT SHOULDER - 2+ VIEW

[shoulder ap]
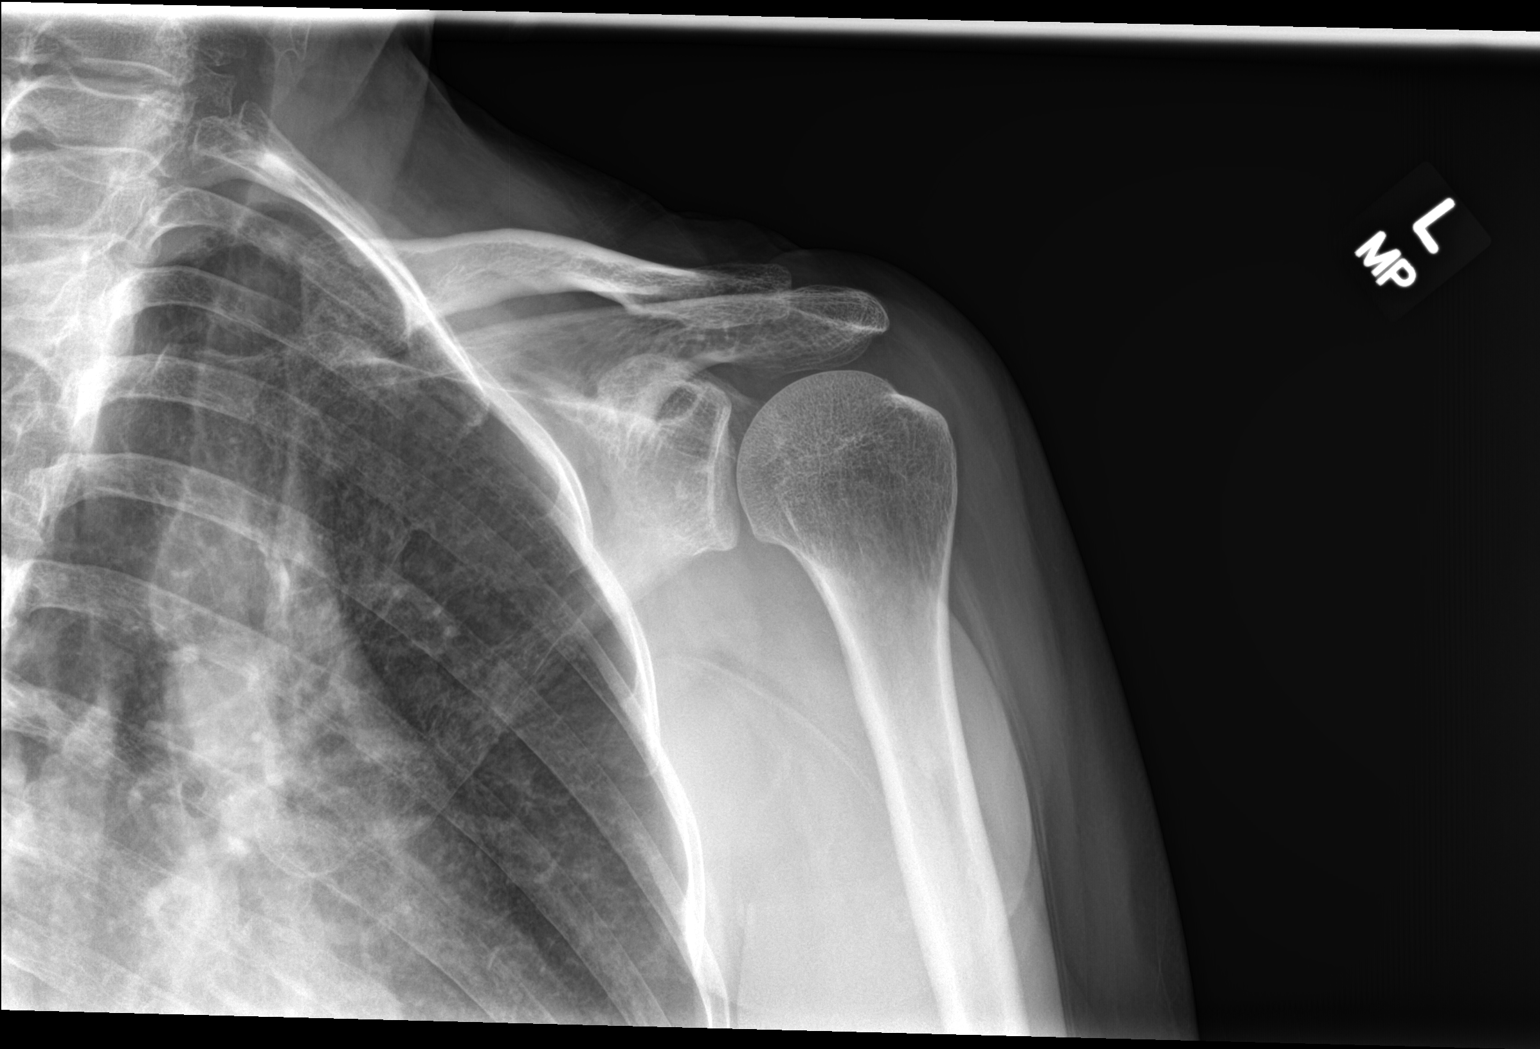

[shoulder obl]
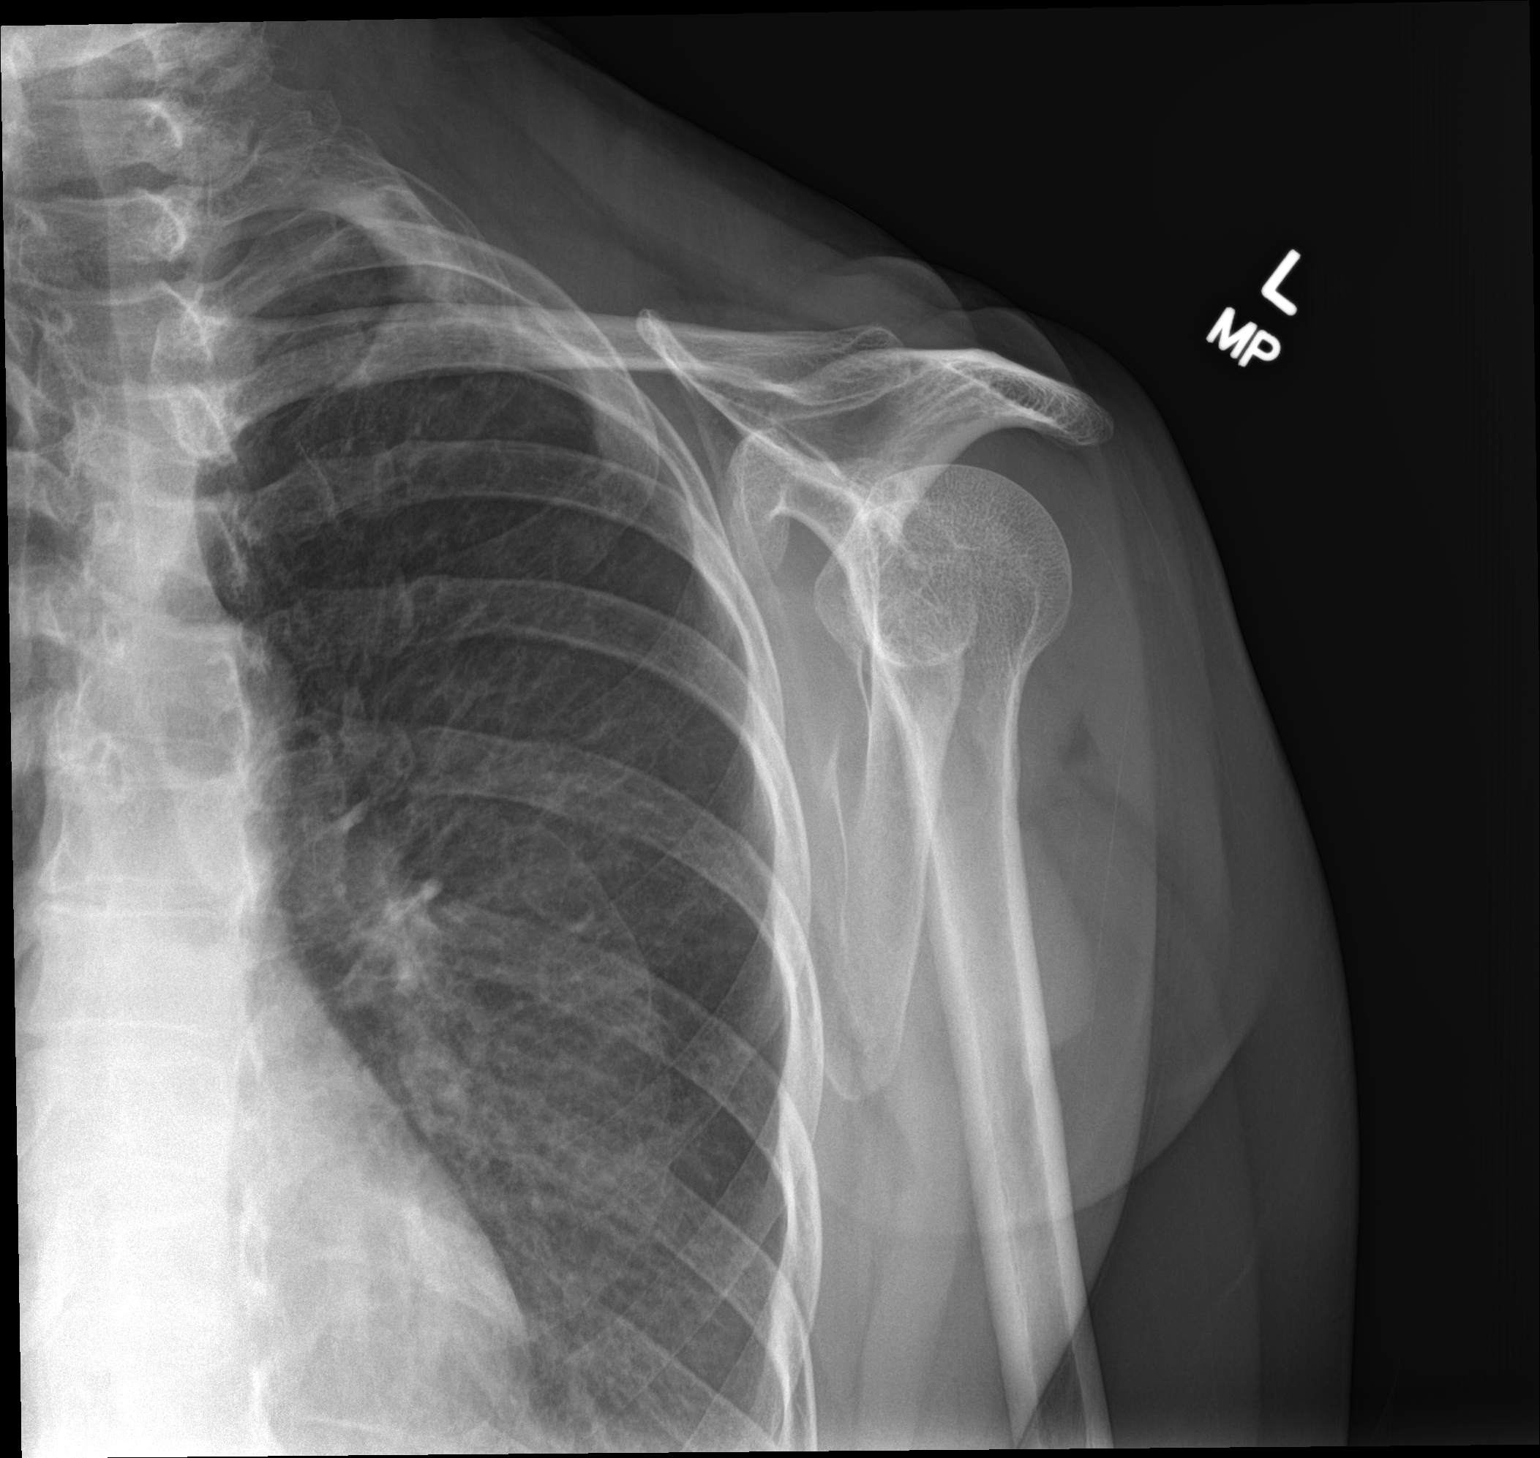

[shoulder axial]
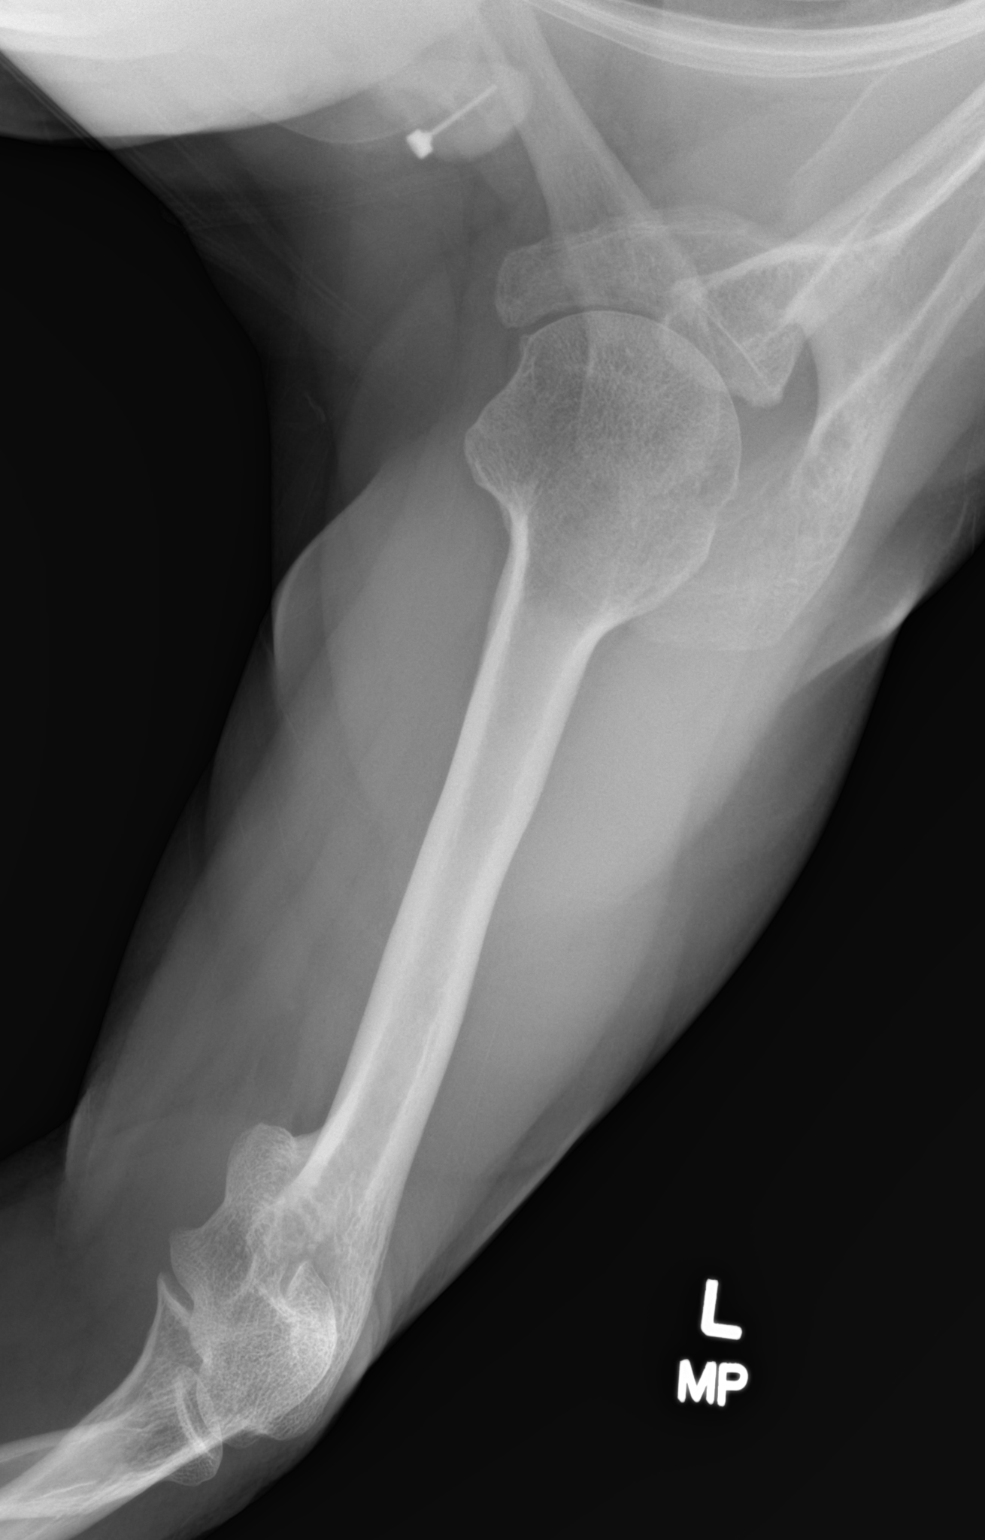

[3 of 3 positions shown; findings below may reference images not displayed]

FINDINGS: Left shoulder is located without a fracture. Normal alignment at the
left AC joint. Sclerosis involving the left first rib is
nonspecific. Mild cortical irregularity involving the left second
rib could be related to old fracture. Evidence for left breast
implant.
IMPRESSION: 1. No acute bone abnormality to the left shoulder.
2. Sclerosis and irregularity involving the left first and second
ribs are nonspecific. Findings may be related to old trauma. No
comparison imaging to evaluate the stability of this finding.
Recommend chest CT to further evaluate the rib sclerosis and exclude
a neoplastic process if the patient does not have a history of
trauma or pathology in this area.

These results will be called to the ordering clinician or
representative by the Radiologist Assistant, and communication
documented in the PACS or zVision Dashboard.

## 2021-09-04 IMAGING — CT CT CHEST W/ CM
2 of 3 series · 15 of 36 positions shown, 18 images · IV contrast (Omnipaque or Isovue)
Comparison: Left shoulder radiograph 01/04/2019

CLINICAL DATA: Left shoulder pain with radiographic lesion of the
ribs on outside x-ray.

EXAM:
CT CHEST WITH CONTRAST
TECHNIQUE: Multidetector CT imaging of the chest was performed during
intravenous contrast administration.
CONTRAST:  75mL OMNIPAQUE IOHEXOL 300 MG/ML  SOLN

[Series 2: routine chest with · axial · 0.63mm/px · z∈[+1200,+1490]mm · 12 of 171 slices shown, 15 images]
[im 13/171  mediastinal]
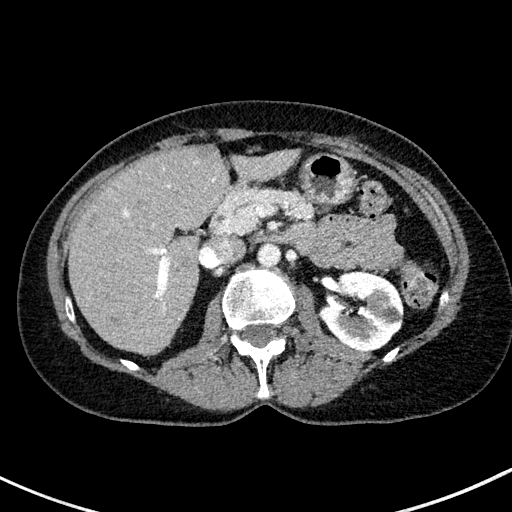
[im 13/171  lung]
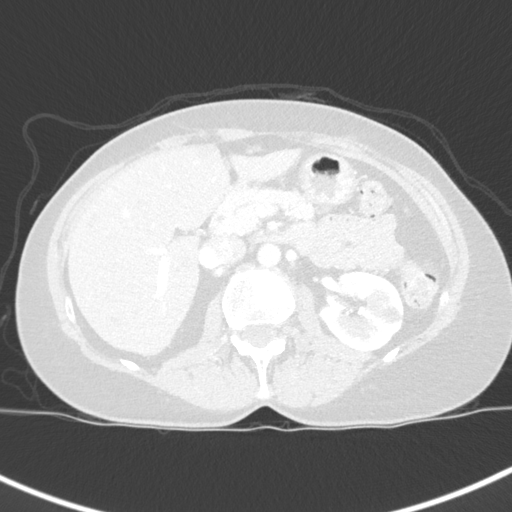
[im 26/171  lung]
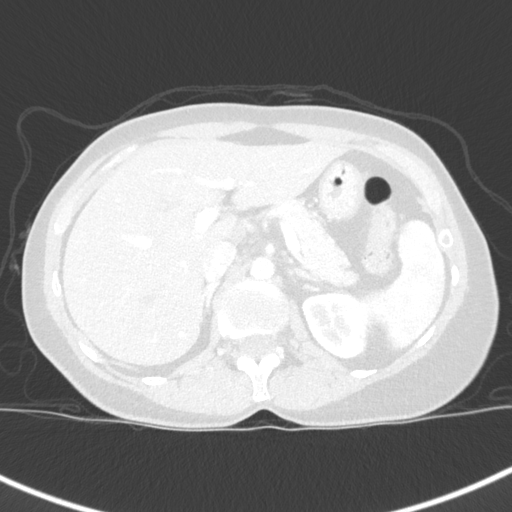
[im 38/171  lung]
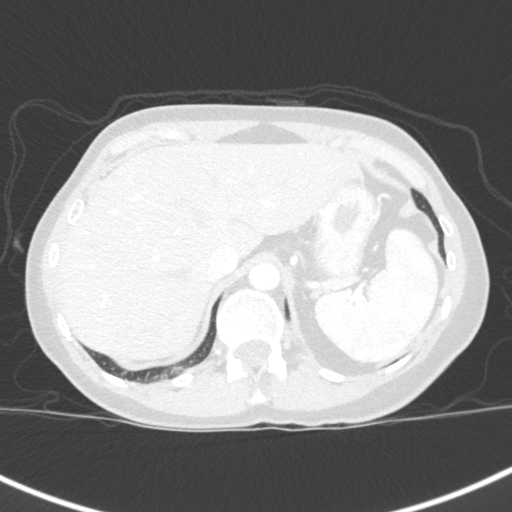
[im 51/171  lung]
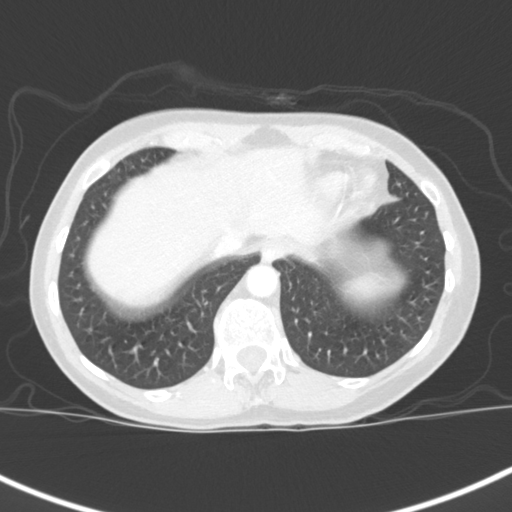
[im 63/171  mediastinal]
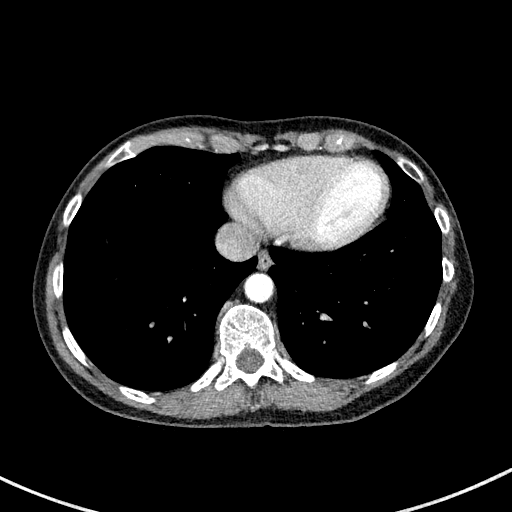
[im 63/171  lung]
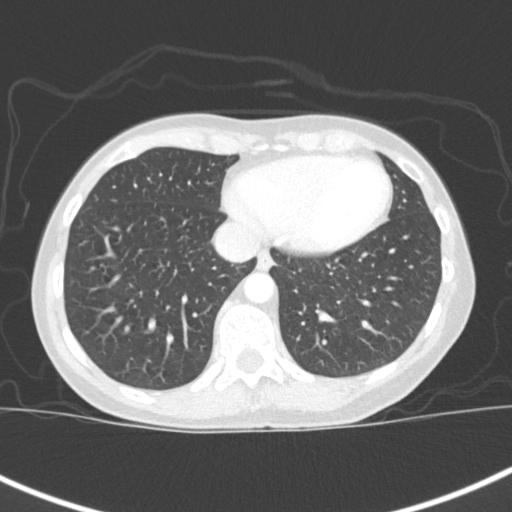
[im 76/171  lung]
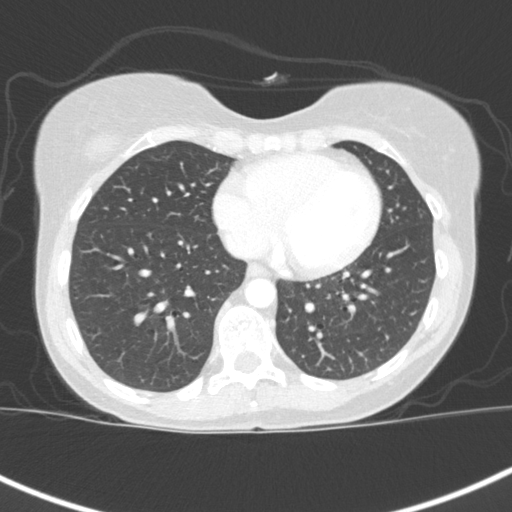
[im 95/171  lung]
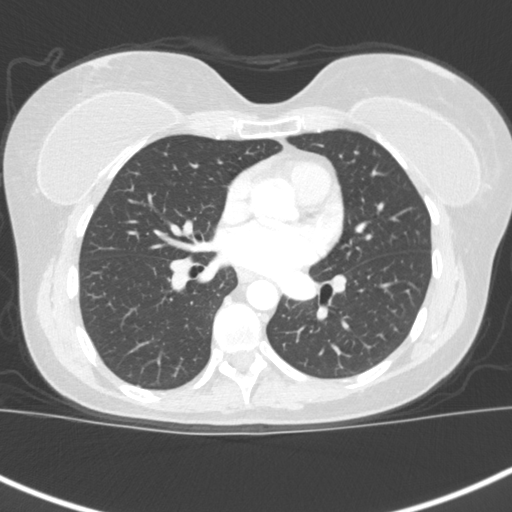
[im 108/171  lung]
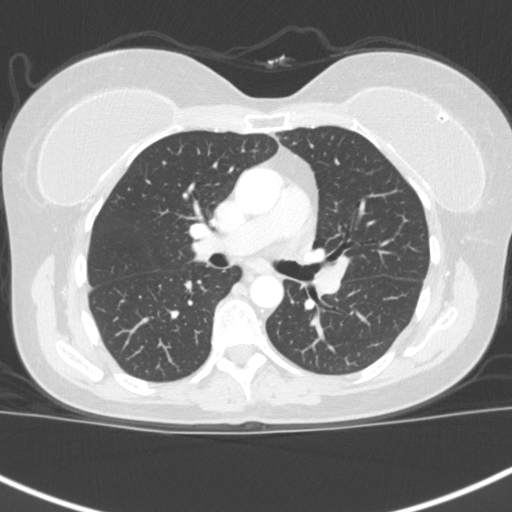
[im 120/171  mediastinal]
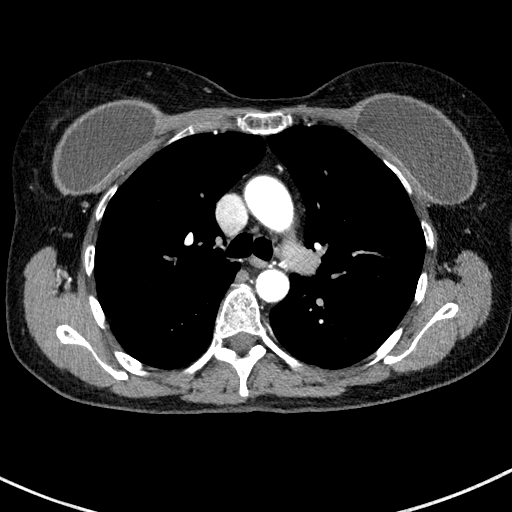
[im 120/171  lung]
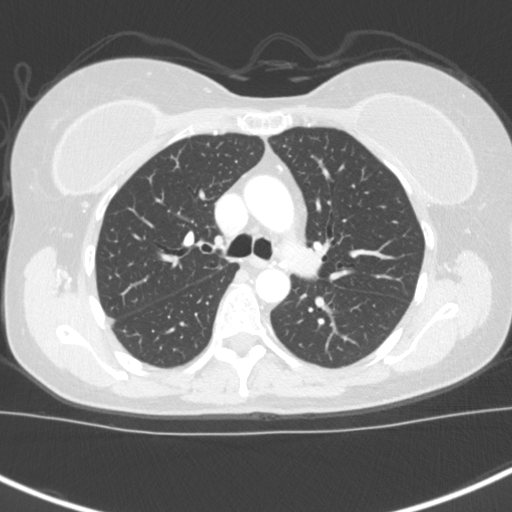
[im 133/171  lung]
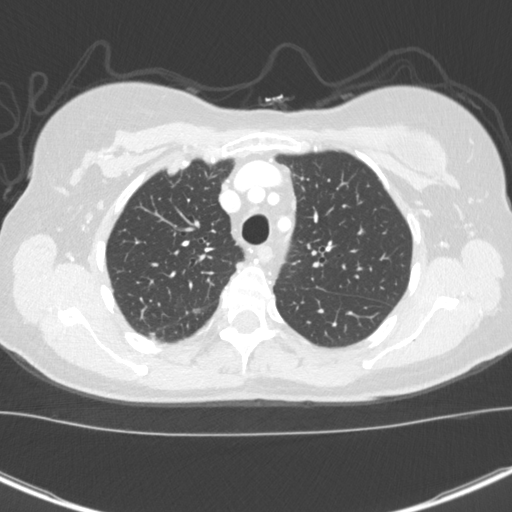
[im 145/171  lung]
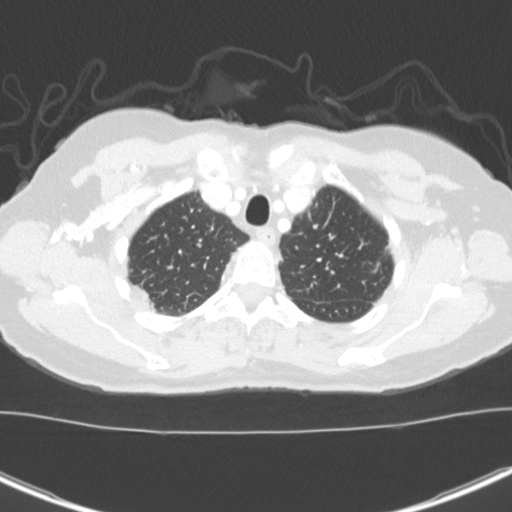
[im 158/171  lung]
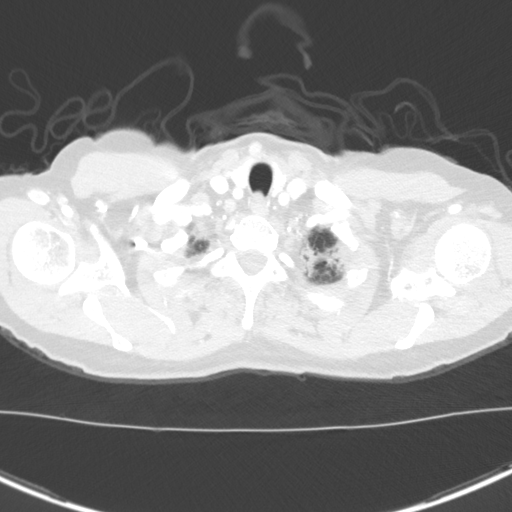

[Series 5: coronal · coronal · 0.65mm/px · 3 of 144 slices shown]
[im 29/144  lung]
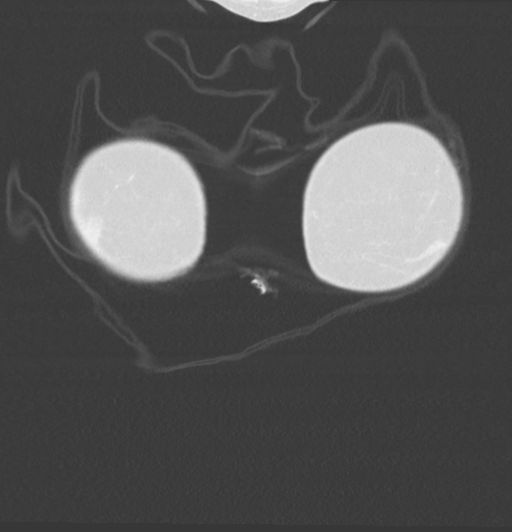
[im 58/144  lung]
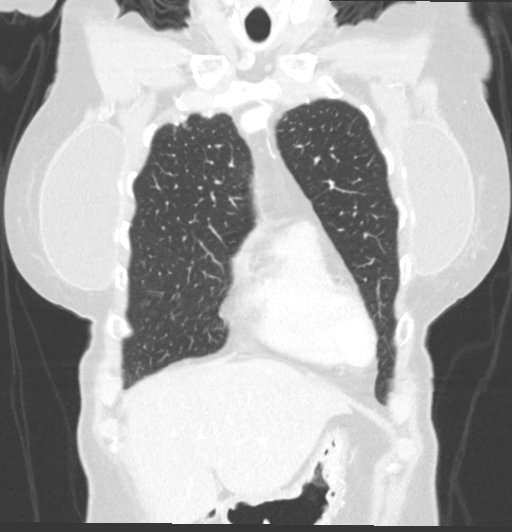
[im 86/144  lung]
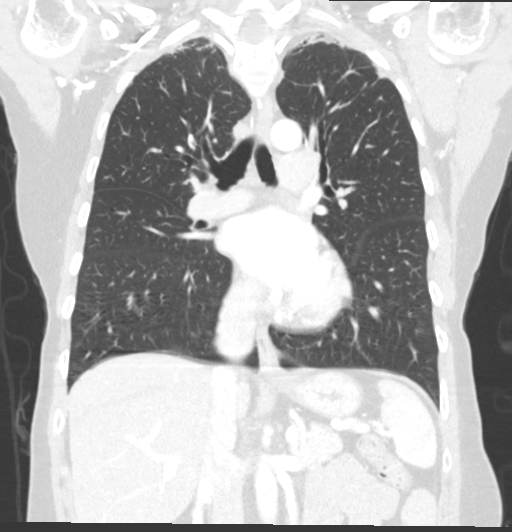

[15 of 36 positions shown; findings below may reference images not displayed]

FINDINGS: Cardiovascular: Normal heart size. No pericardial effusion. No acute
vascular finding. Mild atherosclerotic calcification of the aorta.

Mediastinum/Nodes: Negative for adenopathy or mass. 6 mm nodule in
the left thyroid gland which is below size threshold for sonographic
follow-up per consensus guidelines.

Lungs/Pleura: Biapical subpleural scarring. There is no edema,
consolidation, effusion, or pneumothorax.

Upper Abdomen: Negative

Musculoskeletal: Radiographic abnormality correlates with a
developmental anomaly where the bilateral upper most ribs are fused.
No evidence of fracture or destructive lesion.
IMPRESSION: Radiographic findings correlate with bilateral upper rib non
segmentation. No bone lesion or other worrisome finding.

## 2021-09-10 ENCOUNTER — Telehealth: Payer: Self-pay

## 2021-09-10 ENCOUNTER — Other Ambulatory Visit: Payer: Self-pay | Admitting: Internal Medicine

## 2021-09-10 DIAGNOSIS — M069 Rheumatoid arthritis, unspecified: Secondary | ICD-10-CM

## 2021-09-10 NOTE — Telephone Encounter (Signed)
Advised patient that since she was seen over 3 months ago recommend we follow up next available time that works for her. Advised that Dr. Benjamine Mola will refill prednisone Rx for next 1 month at this time. Patient expressed verbal understanding and said she would call to schedule an appointment with [place] soon.

## 2021-09-10 NOTE — Telephone Encounter (Signed)
Next Visit: Not scheduled please advise  Last Visit: 05/26/2021  Last Fill: 07/15/2021  Dx: Rheumatoid arthritis involving both hands, unspecified whether rheumatoid factor present   Current Dose per office note on 05/26/2021: prednisone 5 mg daily   Okay to refill Prednisone?

## 2021-09-10 NOTE — Telephone Encounter (Signed)
Patient was seen over 3 months ago recommend we follow up next available time that works for her. I will refill prednisone Rx for next 1 month at this time.

## 2021-10-08 ENCOUNTER — Other Ambulatory Visit: Payer: Self-pay | Admitting: Internal Medicine

## 2021-10-08 DIAGNOSIS — M069 Rheumatoid arthritis, unspecified: Secondary | ICD-10-CM

## 2021-10-08 NOTE — Telephone Encounter (Signed)
Next Visit: Due around 08/26/2021. Message sent to the front to schedule.   Last Visit: 05/26/2021  Last Fill: 09/10/2021  Dx: Rheumatoid arthritis involving both hands, unspecified whether rheumatoid factor present   Current Dose per office note on 05/26/2021: prednisone 5 mg daily   Okay to refill prednisone?

## 2021-10-09 NOTE — Progress Notes (Signed)
Office Visit Note  Patient: Katie [name]             Date of Birth: 1960-04-12           MRN: [id number]             PCP: Baruch Gouty, FNP Referring: Baruch Gouty, FNP Visit Date: 10/10/2021   Subjective:  Follow-up (Pain in hands, base of neck, and hips. )   History of Present Illness: Katie [name] is a 61 y.o. 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 She stopped Humira after last visit due to developing palpitations and pressure in her chest and neck after taking the medication. Since stopping it these symptoms completely went away.  She never recalls any similar symptoms in the past and has not had any with the prednisone.  Previous HPI 05/26/2021  Katie [name] is a 61 y.o. 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 Humira new start 04/10/2021.  Symptoms slightly improved with the medication so far still has soreness in right hand MCPs main affected area. After about 3 weeks since starting the medication she has noticed an increase in symptoms she feels are similar to palpitations or a panic attack and like there is pressure over the upper part of her chest.  These come and go within a few minutes but have been occurring pretty much daily for weeks.  She has used her husband's A-fib monitor to check and not seen any obvious rhythm abnormality testing at home.  She tried taking hydroxyzine as needed as previously prescribed for her did not notice any symptom improvement just sedation.   Previous HPI 03/19/2021 Katie [name] is a 61 y.o. 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 Labs were checked at initial visit planning for trying biologic DMARD due to intolerance of oral medications. She has increased joint pain in multiple areas since stopping the low dose prednisone weeks ago. Worst in neck and shoulders and in right hand. Also started having a faint skin rash around her neck  and collar area in the past week.   Previous HPI 02/26/21 Katie [name] is a 61 y.o. female here for seronegative RA. She saw Dr. Charlestine Night in 2017-2018 tried a few medications without much success but had good response to low dose prednisone. She did not tolerate methotrexate or leflunomide with liver function changes and on methotrexate also had significant hair loss. She saw Dr. Amil Amen once for this problem but did not follow up for any long term treatment. She is currently on prednisone 5 mg daily which is controlling symptoms mostly but has a lot of pain any time she comes off this. She has noticed some increase in forward bending of neck and also has a chronic enlarged right supraclavicular lymph node for years.  Bone densitometry from 2015 showing osteopenia t-score -1.4.   DMARD Hx Humira- Palpitations? MTX- LFT changes, alopecia LEF- LFT changes   Review of Systems  Constitutional:  Positive for fatigue.  HENT:  Negative for mouth sores and mouth dryness.   Eyes:  Positive for dryness.  Respiratory:  Negative for shortness of breath.   Cardiovascular:  Negative for chest pain and palpitations.  Gastrointestinal:  Negative for blood in stool, constipation and diarrhea.  Endocrine: Positive for increased urination.  Genitourinary:  Negative for involuntary urination.  Musculoskeletal:  Positive for joint pain, joint pain  and morning stiffness. Negative for joint swelling, myalgias, muscle weakness, muscle tenderness and myalgias.  Skin:  Negative for color change, rash, hair loss and sensitivity to sunlight.  Allergic/Immunologic: Negative for susceptible to infections.  Neurological:  Negative for dizziness and headaches.  Hematological:  Negative for swollen glands.  Psychiatric/Behavioral:  Positive for depressed mood and sleep disturbance. The patient is nervous/anxious.     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 03/19/2021   High risk medication use 02/26/2021   Dysuria 01/24/2020   Decreased libido 06/02/2018   Depression, recurrent ([place]) 05/27/2018   GAD (generalized anxiety disorder) 05/27/2018   Gastroesophageal reflux disease without esophagitis 05/27/2018   Anxiety and depression 05/27/2016   Hair loss 05/27/2016   RA (rheumatoid arthritis) ([place]) 05/27/2016    Past Medical History:  Diagnosis Date   Arthritis    RA   Asthma    no attack since childhood   Blood transfusion    Contact lens/glasses fitting    wears glasses or contacts   Cystocele    Depression    External hemorrhoids    Fibromyalgia    GERD (gastroesophageal reflux disease)    past hx    Glaucoma    RIGHT EYE   HPV (human papilloma virus) infection    Neuromuscular disorder ([place])    fibromyalgia   Syncope 2015   Pt questioned seizure with this episode - nothing like since - vaso vagal response    Wears dentures    top    Family History  Problem Relation Age of Onset   Hypertension Mother    Hypothyroidism Mother    Rheum arthritis Mother    Emphysema Father    Kidney disease Sister    Hypertension Sister    Bone cancer Daughter        Died at age 22   Celiac disease Daughter    Hypertension Daughter    Colon cancer Neg Hx    Colon polyps Neg Hx    Past Surgical History:  Procedure Laterality Date   ABDOMINAL EXPLORATION SURGERY  1984   bleed after hyst   APPENDECTOMY  1983   BLADDER SUSPENSION  2010   BREAST ENHANCEMENT SURGERY  2000   CARPAL TUNNEL RELEASE  1995   rt   CARPAL TUNNEL RELEASE Left 12/09/2012   Procedure: LEFT LIMITED OPEN CARPAL TUNNEL RELEASE;  Surgeon: Roseanne Kaufman, MD;  Location: [facility];  Service: Orthopedics;  Laterality: Left;   COLONOSCOPY     CYSTOSCOPY     DIAGNOSTIC LAPAROSCOPY     MINI SHUNT INSERTION  10/14/2011   Procedure: INSERTION OF MINI SHUNT;  Surgeon: Marylynn Pearson, MD;  Location: [facility];  Service:  Ophthalmology;  Laterality: Right;   POLYPECTOMY     [place]   Social History   Social History Narrative   Not on file   Immunization History  Administered Date(s) Administered   Influenza,inj,Quad PF,6+ Mos 11/08/2020   Influenza-Unspecified 11/07/2018   Moderna Sars-Covid-2 Vaccination 05/29/2019   Pneumococcal Conjugate-13 11/10/2018   Td 02/21/2018   Tdap 02/21/2018     Objective: Vital Signs: BP 105/64 (BP Location: Left Arm, Patient Position: Sitting, Cuff Size: Normal)   Pulse 70   Resp 15   Ht 5' (1.524 m)   Wt 116 lb 9.6 oz (52.9 kg)   BMI 22.77  kg/m    Physical Exam HENT:     Mouth/Throat:     Mouth: Mucous membranes are moist.     Pharynx: Oropharynx is clear.  Eyes:     Conjunctiva/sclera: Conjunctivae normal.  Cardiovascular:     Rate and Rhythm: Normal rate and regular rhythm.  Pulmonary:     Effort: Pulmonary effort is normal.     Breath sounds: Normal breath sounds.  Musculoskeletal:     Right lower leg: No edema.     Left lower leg: No edema.  Lymphadenopathy:     Cervical: No cervical adenopathy.  Skin:    General: Skin is warm and dry.  Neurological:     Mental Status: She is alert.  Psychiatric:        Mood and Affect: Mood normal.      Musculoskeletal Exam:  Neck full ROM, tenderness midline at base of neck and some with lateral rotation Shoulders full ROM no tenderness or swelling Elbows full ROM no tenderness or swelling Wrists full ROM no tenderness or swelling Fingers bony nodules most advanced at MCPs, right 2nd-3rd tender and 3rd MCP swelling Hip normal internal and external rotation without pain, no tenderness to lateral hip palpation Knees full ROM no tenderness or swelling, patellofemoral crepitus present  CDAI Exam: CDAI Score: 7  Patient Global: 20 mm; Provider Global: 20 mm Swollen: 1 ; Tender: 2  Joint Exam 10/10/2021      Right  Left  MCP 2   Tender     MCP 3  Swollen  Tender        Investigation: No additional findings.  Imaging: No results found.  Recent Labs: Lab Results  Component Value Date   WBC 5.4 05/26/2021   HGB 13.7 05/26/2021   PLT 175 05/26/2021   NA 141 05/26/2021   K 4.0 05/26/2021   CL 106 05/26/2021   CO2 29 05/26/2021   GLUCOSE 82 05/26/2021   BUN 13 05/26/2021   CREATININE 0.78 05/26/2021   BILITOT 0.3 05/26/2021   ALKPHOS 84 11/08/2020   AST 15 05/26/2021   ALT 11 05/26/2021   PROT 6.8 05/26/2021   ALBUMIN 4.4 11/08/2020   CALCIUM 9.8 05/26/2021   GFRAA 89 05/27/2018   QFTBGOLDPLUS NEGATIVE 02/26/2021    Speciality Comments: MTX stopped due to elevated LFTs/hairloss; leflunomide stopped due to elevated LFTs, Humira started 04/10/21  Procedures:  No procedures performed Allergies: Codeine   Assessment / Plan:     Visit Diagnoses: Rheumatoid arthritis involving both hands, unspecified whether rheumatoid factor present ([place]) - Plan: Sedimentation rate  Increasing joint pains most noticeably at the right hand that she is only on 5 mg prednisone for treatment.  We will recheck sedimentation rate for disease activity monitoring though this has been normal in the past on just prednisone.  Discussed alternate treatment option she would like to minimize side effect risk as a priority.  We will plan to switch to subcutaneous abatacept with a relatively lower infectious risk for Biologics and I am unaware of any association with heart palpitation type of symptoms.  High risk medication use - prednisone '5mg'$  po qd - Plan: CBC with Differential/Platelet, COMPLETE METABOLIC PANEL WITH GFR  Now on just low-dose prednisone after discontinuing the Humira but planning to switch to a new biologic DMARD.  Checking CBC and CMP for medication monitoring.  Discussed risks of Orencia including injection site reactions, infections, malignancy, COPD exacerbation.  Blindness of right eye with normal vision in contralateral eye  Not  recommending any trial of hydroxychloroquine due to existing eye disease.  Orders: Orders Placed This Encounter  Procedures   Sedimentation rate   CBC with Differential/Platelet   COMPLETE METABOLIC PANEL WITH GFR   No orders of the defined types were placed in this encounter.    Follow-Up Instructions: Return in about 3 months (around 01/10/2022) for RA on GC/ABA start f/u 76mo.   CCollier Salina MD  Note - This record has been created using [employer]  Chart creation errors have been sought, but may not always  have been located. Such creation errors do not reflect on  the standard of medical care.

## 2021-10-10 ENCOUNTER — Telehealth: Payer: Self-pay | Admitting: *Deleted

## 2021-10-10 ENCOUNTER — Ambulatory Visit: Payer: 59 | Attending: Internal Medicine | Admitting: Internal Medicine

## 2021-10-10 ENCOUNTER — Encounter: Payer: Self-pay | Admitting: Internal Medicine

## 2021-10-10 ENCOUNTER — Telehealth: Payer: Self-pay | Admitting: Pharmacist

## 2021-10-10 VITALS — BP 105/64 | HR 70 | Resp 15 | Ht 60.0 in | Wt 116.6 lb

## 2021-10-10 DIAGNOSIS — H5440 Blindness, one eye, unspecified eye: Secondary | ICD-10-CM

## 2021-10-10 DIAGNOSIS — Z79899 Other long term (current) drug therapy: Secondary | ICD-10-CM | POA: Diagnosis not present

## 2021-10-10 DIAGNOSIS — M069 Rheumatoid arthritis, unspecified: Secondary | ICD-10-CM

## 2021-10-10 NOTE — Patient Instructions (Signed)
Abatacept Injection What is this medication? ABATACEPT (a ba TA sept) treats autoimmune conditions, such as arthritis. It may also be used to treat a condition that can occur after a stem cell or bone marrow transplant (chronic graft versus host disease). It works by slowing down an overactive immune system. This medicine may be used for other purposes; ask your health care provider or pharmacist if you have questions. COMMON BRAND NAME(S): Orencia, Orencia ClickJect What should I tell my care team before I take this medication? They need to know if you have any of these conditions: Cancer Diabetes Having surgery Hepatitis B or history of hepatitis B infection Immune system problems Infection, especially a viral infection, such as chickenpox, cold sores, herpes Lung or breathing problems, such as asthma or COPD Recent or upcoming vaccine Tuberculosis, a positive skin test for tuberculosis, or have recently been in close contact with someone who has tuberculosis An unusual or allergic reaction to abatacept, other medications, foods, dyes, or preservatives Pregnant or trying to get pregnant Breastfeeding How should I use this medication? This medication is infused into a vein or injected under the skin. Infusions are given by your care team in a hospital or clinic setting. It may be injected under the skin at home. If you get this medication at home, you will be taught how to prepare and give it. Use exactly as directed. Take it as directed on the prescription label. Keep taking it unless your care team tells you to stop. It is important that you put your used needles and syringes in a special sharps container. Do not put them in a trash can. If you do not have a sharps container, call your pharmacist or care team to get one. Talk to your care team about the use of this medication in children. While it may be prescribed for children as young as 2 years for selected conditions, precautions do  apply. Overdosage: If you think you have taken too much of this medicine contact a poison control center or emergency room at once. NOTE: This medicine is only for you. Do not share this medicine with others. What if I miss a dose? If you miss a dose, take it as soon as you can. If it is almost time for your next dose, take only that dose. Do not take double or extra doses. What may interact with this medication? Do not take this medication with any of the following: Live virus vaccines This medication may also interact with the following: Anakinra Baricitinib Canakinumab Medications that lower your chance of fighting an infection Rituximab TNF blockers, such as adalimumab, certolizumab, etanercept, golimumab, infliximab Tocilizumab Tofacitinib Upadacitinib Ustekinumab This list may not describe all possible interactions. Give your health care provider a list of all the medicines, herbs, non-prescription drugs, or dietary supplements you use. Also tell them if you smoke, drink alcohol, or use illegal drugs. Some items may interact with your medicine. What should I watch for while using this medication? Visit your care team for regular checks on your progress. Tell your care team if your symptoms do not start to get better or if they get worse. You will be tested for tuberculosis (TB) before you start this medication. If your care team prescribed any medication for TB, you should start taking the TB medication before starting this medication. Make sure to finish the full course of TB medication. This medication may increase your risk of getting an infection. Call your care team if you get fever,   chills, or sore throat, or other symptoms of a cold or flu. Do not treat yourself. Try to avoid being around people who are sick. If you have diabetes and are getting this medication as an infusion, it may give false high blood sugar readings on the day of your dose. This may happen if you use certain  types of blood glucose tests. Your care team may tell you to use a different way to monitor your blood sugar levels. What side effects may I notice from receiving this medication? Side effects that you should report to your care team as soon as possible: Allergic reactions--skin rash, itching, hives, swelling of the face, lips, tongue, or throat Infection--fever, chills, cough, sore throat, wounds that don't heal, pain or trouble when passing urine, general feeling of discomfort or being unwell Side effects that usually do not require medical attention (report to your care team if they continue or are bothersome): Back pain Cough Dizziness Headache Runny or stuffy nose Sore throat This list may not describe all possible side effects. Call your doctor for medical advice about side effects. You may report side effects to FDA at 1-800-FDA-1088. Where should I keep my medication? If you take this medication at home, keep out of the reach of children and pets. Store in the refrigerator. Keep this medication in the original container. Protect from light. Do not freeze. Do not shake. Get rid of any unused medication after the expiration date. To get rid of medications that are no longer needed or have expired: Take the medication to a medication take-back program. Check with your pharmacy or law enforcement to find a location. If you cannot return the medication, ask your pharmacist or care team how to get rid of the medication safely. NOTE: This sheet is a summary. It may not cover all possible information. If you have questions about this medicine, talk to your doctor, pharmacist, or health care provider.  2023 Elsevier/Gold Standard (2021-05-20 00:00:00)  

## 2021-10-10 NOTE — Telephone Encounter (Addendum)
Please start Orencia SQ BIV.  Dose: '125mg'$  SQ every 7 days  Dx: RA (M06.9)  Once approved, patient will need to be enrolled into copay card  Knox Saliva, PharmD, MPH, BCPS, CPP Clinical Pharmacist (Rheumatology and Pulmonology)  ----- Message from Shona Needles, RT sent at 10/10/2021  8:43 AM EDT ----- Regarding: [place] Please apply for Orencia. Thank you.

## 2021-10-11 LAB — CBC WITH DIFFERENTIAL/PLATELET
Absolute Monocytes: 451 cells/uL (ref 200–950)
Basophils Absolute: 28 cells/uL (ref 0–200)
Basophils Relative: 0.5 %
Eosinophils Absolute: 72 cells/uL (ref 15–500)
Eosinophils Relative: 1.3 %
HCT: 40.4 % (ref 35.0–45.0)
Hemoglobin: 13.8 g/dL (ref 11.7–15.5)
Lymphs Abs: 1540 cells/uL (ref 850–3900)
MCH: 31.2 pg (ref 27.0–33.0)
MCHC: 34.2 g/dL (ref 32.0–36.0)
MCV: 91.2 fL (ref 80.0–100.0)
MPV: 11.3 fL (ref 7.5–12.5)
Monocytes Relative: 8.2 %
Neutro Abs: 3410 cells/uL (ref 1500–7800)
Neutrophils Relative %: 62 %
Platelets: 175 10*3/uL (ref 140–400)
RBC: 4.43 10*6/uL (ref 3.80–5.10)
RDW: 12.7 % (ref 11.0–15.0)
Total Lymphocyte: 28 %
WBC: 5.5 10*3/uL (ref 3.8–10.8)

## 2021-10-11 LAB — COMPLETE METABOLIC PANEL WITH GFR
AG Ratio: 1.8 (calc) (ref 1.0–2.5)
ALT: 11 U/L (ref 6–29)
AST: 13 U/L (ref 10–35)
Albumin: 4.3 g/dL (ref 3.6–5.1)
Alkaline phosphatase (APISO): 76 U/L (ref 37–153)
BUN: 11 mg/dL (ref 7–25)
CO2: 31 mmol/L (ref 20–32)
Calcium: 9.9 mg/dL (ref 8.6–10.4)
Chloride: 106 mmol/L (ref 98–110)
Creat: 0.85 mg/dL (ref 0.50–1.05)
Globulin: 2.4 g/dL (calc) (ref 1.9–3.7)
Glucose, Bld: 64 mg/dL — ABNORMAL LOW (ref 65–99)
Potassium: 4 mmol/L (ref 3.5–5.3)
Sodium: 144 mmol/L (ref 135–146)
Total Bilirubin: 0.4 mg/dL (ref 0.2–1.2)
Total Protein: 6.7 g/dL (ref 6.1–8.1)
eGFR: 77 mL/min/{1.73_m2} (ref >=60)

## 2021-10-11 LAB — SEDIMENTATION RATE: Sed Rate: 11 mm/h (ref 0–30)

## 2021-10-13 NOTE — Progress Notes (Signed)
Lab results look fine for continuing with current plan.  Looking into the Orencia medication approval.

## 2021-10-14 NOTE — Telephone Encounter (Signed)
Submitted a Prior Authorization request to [facility] for [facility] via CoverMyMeds. Will update once we receive a response.  Key: [id number]  Will likely be denied as patient has not tried and failed Enbrel, Rinvoq, and/or Xeljanz which seem to be formulary preferred  Knox Saliva, PharmD, MPH, BCPS, CPP Clinical Pharmacist (Rheumatology and Pulmonology)

## 2021-10-15 ENCOUNTER — Telehealth: Payer: Self-pay | Admitting: Pharmacist

## 2021-10-15 NOTE — Telephone Encounter (Signed)
Received a fax regarding Prior Authorization from [place] for [facility]. Authorization has been DENIED because patient must meet any of the following conditions is met: a) the patient is currently receiving the requested medication, the requested medication is not Cimzia vial, and the patient is not receiving the product through samples or manufacturer's assistance program,  b) the patient has tried ALL of the preferred products and they didn't work well or the patient had a bad side effect,  c) the patient cannot take Enbrel and Humira because of a medical reason AND has tried both Rinvoq and [place] and they didn't work well or the patient had a bad side effect,  d) the patient cannot take Rinvoq and Xeljanz/Xeljanz XR because of a medical reason AND has tried both Enbrel and Humira and they didn't work well or the patient had a bad side effect,  e) the patient cannot take any of the preferred products because of a medical reason, or  f) the request is for Cimzia and the patient is currently breastfeeding, pregnant, or planning pregnancy.  I spoke with Dr. Benjamine Mola - we can try JAK inhibitor if patient is willing to trial these. Will contact patient to discuss  Knox Saliva, PharmD, MPH, BCPS, CPP Clinical Pharmacist (Rheumatology and Pulmonology)

## 2021-10-15 NOTE — Telephone Encounter (Signed)
Pharmacy Note  Subjective: Patient called today by [facility] Rheumatology pharmacist to counsel on Rinvoq and Katie [name]. She has RA. Prevously on Humria which caused palpitation. She also had minimal response - primary TNF non-responder   History of diverticulitis:  No  History of MI, stroke, or CV events:  No She states her father had MI. Both of her maternal grandparents had MI. She has no personal history of MACE.  Objective:  CMP     Component Value Date/Time   NA 144 10/10/2021 0838   NA 140 11/08/2020 0918   K 4.0 10/10/2021 0838   CL 106 10/10/2021 0838   CO2 31 10/10/2021 0838   GLUCOSE 64 (L) 10/10/2021 0838   BUN 11 10/10/2021 0838   BUN 9 11/08/2020 0918   CREATININE 0.85 10/10/2021 0838   CALCIUM 9.9 10/10/2021 0838   PROT 6.7 10/10/2021 0838   PROT 6.5 11/08/2020 0918   ALBUMIN 4.4 11/08/2020 0918   AST 13 10/10/2021 0838   ALT 11 10/10/2021 0838   ALKPHOS 84 11/08/2020 0918    CBC    Component Value Date/Time   WBC 5.5 10/10/2021 0838   RBC 4.43 10/10/2021 0838   HGB 13.8 10/10/2021 0838   HGB 13.9 11/08/2020 0918   HCT 40.4 10/10/2021 0838   HCT 41.5 11/08/2020 0918   PLT 175 10/10/2021 0838   PLT 188 11/08/2020 0918   MCV 91.2 10/10/2021 0838   MCV 93 11/08/2020 0918   MCH 31.2 10/10/2021 0838   MCHC 34.2 10/10/2021 0838   RDW 12.7 10/10/2021 0838   RDW 13.1 11/08/2020 0918    Baseline Immunosuppressant Therapy Labs TB GOLD    Latest Ref Rng & Units 02/26/2021    9:05 AM  Quantiferon TB Gold  Quantiferon TB Gold Plus NEGATIVE NEGATIVE    Hepatitis Panel    Latest Ref Rng & Units 02/26/2021    9:05 AM  Hepatitis  Hep B Surface Ag NON-REACTIVE NON-REACTIVE   Hep B IgM NON-REACTIVE NON-REACTIVE    HIV Lab Results  Component Value Date   HIV Non Reactive 02/21/2018   Immunoglobulins   SPEP    Latest Ref Rng & Units 10/10/2021    8:38 AM  Serum Protein Electrophoresis  Total Protein 6.1 - 8.1 g/dL 6.7    G6PD No results found  for: "G6PDH" TPMT No results found for: "TPMT"   Lipid Panel Lab Results  Component Value Date   CHOL 230 (H) 02/21/2018   HDL 73 02/21/2018   LDLCALC 123 (H) 02/21/2018   TRIG 168 (H) 02/21/2018   CHOLHDL 3.2 02/21/2018     Assessment/Plan:  Counseled patient that Rinvoq and Xeljanz are JAK inhibitor indicated for Rheumatoid Arthritis.  Counseled patient on purpose, proper use, and adverse effects of Rinvoq and Xeljanz.    Reviewed the most common adverse effects including infection, diarrhea, headaches.  Also reviewed rare adverse effects such as bowel injury and the need to contact us if they develop stomach pain during treatment. Counseled on the increase risk of venous thrombosis. Counseled about FDA black box warning of MACE (major adverse CV events including cardiovascular death, myocardial infarction, and stroke).  Reviewed with patient that there is the possibility of an increased risk of malignancy specifically lung cancer and lymphomas but it is not well understood if this increased risk is due to the medication or the disease state. Instructed patient that medication should be held for infection and prior to surgery.  Advised patient to avoid live  vaccines. Recommend annual influenza, PCV 15 or PCV20 or Pneumovax 23, and Shingrix as indicated.    Reviewed importance of routine lab monitoring including lipid panel.  Will recheck lipid panel 3 months after starting and annually thereafter. CBC and CMP will be monitored routinely every 3 months. Standing orders placed. Provided patient with medication education material and answered all questions  Patient would like to do her own research regarding Rinvoq and Katie [name]. I did send MyChart message for both medications via MyChart and advised her to use reliable websites to complete whatever research she completes at home.   She  also states she is considering waiting until new year when she will change insurance to move forward with  change if Maureen Chatters will be covered with new insurance. I advised that no insurance formulary can guarantee that Maureen Chatters will be covered but support her completing her own research. Also reviewed that there are no head-to-head studies comparing Orencia vs JAK inhibitors but we can move forward with appeal if that is what she'd like.  Knox Saliva, PharmD, MPH, BCPS, CPP Clinical Pharmacist (Rheumatology and Pulmonology)

## 2021-10-21 NOTE — Telephone Encounter (Signed)
error 

## 2021-10-25 DIAGNOSIS — I739 Peripheral vascular disease, unspecified: Secondary | ICD-10-CM | POA: Diagnosis not present

## 2021-10-25 DIAGNOSIS — Z809 Family history of malignant neoplasm, unspecified: Secondary | ICD-10-CM | POA: Diagnosis not present

## 2021-10-25 DIAGNOSIS — Z791 Long term (current) use of non-steroidal anti-inflammatories (NSAID): Secondary | ICD-10-CM | POA: Diagnosis not present

## 2021-10-25 DIAGNOSIS — R32 Unspecified urinary incontinence: Secondary | ICD-10-CM | POA: Diagnosis not present

## 2021-10-25 DIAGNOSIS — Z7952 Long term (current) use of systemic steroids: Secondary | ICD-10-CM | POA: Diagnosis not present

## 2021-10-25 DIAGNOSIS — H548 Legal blindness, as defined in USA: Secondary | ICD-10-CM | POA: Diagnosis not present

## 2021-10-25 DIAGNOSIS — R69 Illness, unspecified: Secondary | ICD-10-CM | POA: Diagnosis not present

## 2021-10-25 DIAGNOSIS — M069 Rheumatoid arthritis, unspecified: Secondary | ICD-10-CM | POA: Diagnosis not present

## 2021-10-25 DIAGNOSIS — Z7982 Long term (current) use of aspirin: Secondary | ICD-10-CM | POA: Diagnosis not present

## 2021-10-25 DIAGNOSIS — K08409 Partial loss of teeth, unspecified cause, unspecified class: Secondary | ICD-10-CM | POA: Diagnosis not present

## 2021-10-25 DIAGNOSIS — K219 Gastro-esophageal reflux disease without esophagitis: Secondary | ICD-10-CM | POA: Diagnosis not present

## 2021-11-06 ENCOUNTER — Telehealth: Payer: Self-pay | Admitting: Pharmacist

## 2021-11-06 DIAGNOSIS — Z79899 Other long term (current) drug therapy: Secondary | ICD-10-CM

## 2021-11-06 DIAGNOSIS — M069 Rheumatoid arthritis, unspecified: Secondary | ICD-10-CM

## 2021-11-06 NOTE — Telephone Encounter (Signed)
Will start Xeljanz BIV in new telephone encounter. Thank you!  Knox Saliva, PharmD, MPH, BCPS, CPP Clinical Pharmacist (Rheumatology and Pulmonology)

## 2021-11-06 NOTE — Telephone Encounter (Signed)
Patient returned the call and would like to proceed with xeljanz. Thanks!

## 2021-11-06 NOTE — Telephone Encounter (Signed)
Submitted a Prior Authorization request to [facility] for [facility] via CoverMyMeds. Will update once we receive a response.  Key: [id number]  Knox Saliva, PharmD, MPH, BCPS, CPP Clinical Pharmacist (Rheumatology and Pulmonology)

## 2021-11-06 NOTE — Telephone Encounter (Signed)
Please start [place].  Dose: '11mg'$  once daily  Dx: RA  Once approved, patient will need to be enrolled into copay card  Knox Saliva, PharmD, MPH, BCPS, CPP Clinical Pharmacist (Rheumatology and Pulmonology)

## 2021-11-10 ENCOUNTER — Other Ambulatory Visit: Payer: Self-pay | Admitting: Internal Medicine

## 2021-11-10 DIAGNOSIS — M069 Rheumatoid arthritis, unspecified: Secondary | ICD-10-CM

## 2021-11-10 NOTE — Telephone Encounter (Signed)
Next Visit: 01/12/2022  Last Visit: 10/10/2021  Last Fill: 10/08/2021  Dx: Rheumatoid arthritis involving both hands, unspecified whether rheumatoid factor present   Current Dose per office note on 10/10/2021: prednisone '5mg'$  po qd   Okay to refill Prednisone?

## 2021-11-11 ENCOUNTER — Other Ambulatory Visit (HOSPITAL_COMMUNITY): Payer: Self-pay

## 2021-11-11 NOTE — Telephone Encounter (Signed)
Patient Advocate Encounter  Prior Authorization for Katie [name] has been approved.    PA# [id number] Effective dates: 11.5.23 through 11.5.24  Sahiti Joswick B. CPhT P: [id number] F: [phone]

## 2021-11-12 DIAGNOSIS — R69 Illness, unspecified: Secondary | ICD-10-CM | POA: Diagnosis not present

## 2021-11-12 DIAGNOSIS — Z01419 Encounter for gynecological examination (general) (routine) without abnormal findings: Secondary | ICD-10-CM | POA: Diagnosis not present

## 2021-11-12 DIAGNOSIS — Z6822 Body mass index (BMI) 22.0-22.9, adult: Secondary | ICD-10-CM | POA: Diagnosis not present

## 2021-11-12 DIAGNOSIS — N890 Mild vaginal dysplasia: Secondary | ICD-10-CM | POA: Diagnosis not present

## 2021-11-12 DIAGNOSIS — Z1231 Encounter for screening mammogram for malignant neoplasm of breast: Secondary | ICD-10-CM | POA: Diagnosis not present

## 2021-11-12 DIAGNOSIS — N952 Postmenopausal atrophic vaginitis: Secondary | ICD-10-CM | POA: Diagnosis not present

## 2021-11-12 DIAGNOSIS — L900 Lichen sclerosus et atrophicus: Secondary | ICD-10-CM | POA: Diagnosis not present

## 2021-11-12 NOTE — Telephone Encounter (Signed)
Patient returned call to the office and left message requesting a return call.

## 2021-11-12 NOTE — Telephone Encounter (Signed)
Spoke with patient to enroll into [place] card, stated that she will call back later as she was driving.   [place] [facility] Pharmacy PharmD Candidate [phone]

## 2021-11-13 NOTE — Telephone Encounter (Signed)
Returned call to patient regarding [employer] copay card. Unable to reach. Left VM  Knox Saliva, PharmD, MPH, BCPS, CPP Clinical Pharmacist (Rheumatology and Pulmonology)

## 2021-11-17 NOTE — Telephone Encounter (Signed)
Patient returned call, enrolled patient in [place] card. Patient did not receive a confirmation text message while on the phone, informed patient to watch for a message today. Plan to f/u tomorrow.   [place] [facility] Pharm[facility] PharmD Candidate [phone]

## 2021-11-17 NOTE — Telephone Encounter (Signed)
LVM for patient to call back about getting her enrolled in the [place] card.   [place] [facility] Pharmacy PharmD Candidate [phone]

## 2021-11-19 ENCOUNTER — Other Ambulatory Visit (HOSPITAL_COMMUNITY): Payer: Self-pay

## 2021-11-19 MED ORDER — XELJANZ XR 11 MG PO TB24: 11.0000 mg | ORAL_TABLET | Freq: Every day | ORAL | 0 refills | Status: DC

## 2021-11-19 NOTE — Telephone Encounter (Signed)
Patient LVM stating that she did not receive any messages (text or email) from [place] about the copay card. Returned call and patient did not answer. LVM with patient with instructions that she had to call the copay card company and enroll over the phone; patient was given copay card company phone number [phone]).   [place] of Pharmacy PharmD Candidate [phone]

## 2021-11-19 NOTE — Telephone Encounter (Signed)
Per test claim, patinet must fill with [facility]. Rx sent today to pharmacy  Knox Saliva, PharmD, MPH, BCPS, CPP Clinical Pharmacist (Rheumatology and Pulmonology)

## 2021-11-24 NOTE — Telephone Encounter (Signed)
Spoke with patient, she has not been able to call and set up the copay card, but she said she will complete that today. Will f/u later this week.   [place] of Pharmacy PharmD Candidate [phone]

## 2021-11-26 NOTE — Telephone Encounter (Signed)
Spoke with patient and confirmed that she spoke with the [employer] card company on Monday and enrolled over the phone. States that the rep told her that she will have a determination on her eligibility for the copay card within 72 hours (which is Thanksgiving day). Informed patient to call [facility] (with whom she had just spoken) and give them the copay card information when she gets it.   Will f/u late next week  [place] [facility] Pharmacy PharmD Candidate [phone]

## 2021-12-02 ENCOUNTER — Telehealth: Payer: Self-pay | Admitting: *Deleted

## 2021-12-02 NOTE — Telephone Encounter (Signed)
Patient contacted [facility] reached out to verify if patient has had a negative TB Gold test.    Returned call and spoke with [name] and verified patient had a negative TB Gold test on 02/26/2021. She noted patient's chart.

## 2021-12-03 NOTE — Telephone Encounter (Signed)
LVM with patient to follow-up concerning her [employer] copay card activation.   [place] [facility] Pharmacy PharmD Candidate [phone]

## 2021-12-15 ENCOUNTER — Other Ambulatory Visit (HOSPITAL_COMMUNITY): Payer: Self-pay

## 2021-12-15 NOTE — Telephone Encounter (Signed)
Patient became very frustrated. She said she no longer wants to take [place] especially with what happened last week. States she never heard from copay card company since Thanksgiving (she talked to copay card company right before Thanksgiving). She states that she received Software engineer from pharmacy last week on Friday.  Patient states she received medication on 12/12/2021 with >$5000 bill even though she spoke with [facility]ist at [facility] and advised that she wanted to think more on it. She also said she wanted to make sure she had copay card in place before getting shipment.   She will plan to follow-up with pharmacy to get refund.  I called pharmacy to determine status of Xel[place].  Per pharmacy rep, rx was shipped on 11/26/2021. They don't have record of conversation with patient and pharmacist just that patient was transferred to speak with pharmacy. I was transferred to account solutions in billing department (phone [phone]). Left VM with patient with this phone number.  Knox Saliva, PharmD, MPH, BCPS, CPP Clinical Pharmacist (Rheumatology and Pulmonology)

## 2021-12-18 DIAGNOSIS — R69 Illness, unspecified: Secondary | ICD-10-CM | POA: Diagnosis not present

## 2021-12-18 DIAGNOSIS — R87615 Unsatisfactory cytologic smear of cervix: Secondary | ICD-10-CM | POA: Diagnosis not present

## 2022-01-12 ENCOUNTER — Ambulatory Visit: Payer: 59 | Admitting: Internal Medicine

## 2022-01-21 ENCOUNTER — Ambulatory Visit: Payer: 59 | Admitting: Family Medicine

## 2022-01-21 ENCOUNTER — Encounter: Payer: Self-pay | Admitting: Family Medicine

## 2022-01-21 ENCOUNTER — Ambulatory Visit (INDEPENDENT_AMBULATORY_CARE_PROVIDER_SITE_OTHER): Payer: 59

## 2022-01-21 VITALS — BP 119/73 | HR 80 | Temp 95.1°F | Ht 61.0 in | Wt 117.6 lb

## 2022-01-21 DIAGNOSIS — L2089 Other atopic dermatitis: Secondary | ICD-10-CM | POA: Diagnosis not present

## 2022-01-21 DIAGNOSIS — M542 Cervicalgia: Secondary | ICD-10-CM

## 2022-01-21 MED ORDER — CLOTRIMAZOLE-BETAMETHASONE 1-0.05 % EX CREA: 1.0000 | TOPICAL_CREAM | Freq: Every day | CUTANEOUS | 0 refills | Status: DC

## 2022-01-21 MED ORDER — CYCLOBENZAPRINE HCL 5 MG PO TABS: 5.0000 mg | ORAL_TABLET | Freq: Three times a day (TID) | ORAL | 1 refills | Status: DC | PRN

## 2022-01-21 NOTE — Progress Notes (Signed)
Subjective:  Patient ID: Katie [name], female    DOB: 02/15/1960, 62 y.o.   MRN: [id number]  Patient Care Team: Baruch Gouty, FNP as PCP - General (Family Medicine)   Chief Complaint:  Neck Pain (Patient states she has been having back of neck pain x 1 month )   HPI: Katie [name] is a 62 y.o. female presenting on 01/21/2022 for Neck Pain (Patient states she has been having back of neck pain x 1 month )   Pt presents today with complaints of neck pain and stiffness for over a month. Denies injuries.   Neck Pain  This is a new problem. The current episode started more than 1 month ago. The problem occurs constantly. The problem has been waxing and waning. The pain is associated with nothing. The pain is present in the right side, midline and left side. The quality of the pain is described as aching, burning and shooting. The pain is moderate. The symptoms are aggravated by twisting and position. Pertinent negatives include no chest pain, fever, headaches, leg pain, numbness, pain with swallowing, paresis, photophobia, syncope, tingling, trouble swallowing, visual change, weakness or weight loss. She has tried heat and neck support for the symptoms. The treatment provided mild relief.    Relevant past medical, surgical, family, and social history reviewed and updated as indicated.  Allergies and medications reviewed and updated. Data reviewed: Chart in Epic.   Past Medical History:  Diagnosis Date   Arthritis    RA   Asthma    no attack since childhood   Blood transfusion    Contact lens/glasses fitting    wears glasses or contacts   Cystocele    Depression    External hemorrhoids    Fibromyalgia    GERD (gastroesophageal reflux disease)    past hx    Glaucoma    RIGHT EYE   HPV (human papilloma virus) infection    Neuromuscular disorder (HCC)    fibromyalgia   Syncope 2015   Pt questioned seizure with this episode - nothing like since - vaso vagal response     Wears dentures    top    Past Surgical History:  Procedure Laterality Date   ABDOMINAL EXPLORATION SURGERY  1984   bleed after hyst   [place]   rt   CARPAL TUNNEL RELEASE Left 12/09/2012   Procedure: LEFT LIMITED OPEN CARPAL TUNNEL RELEASE;  Surgeon: Roseanne Kaufman, MD;  Location: [facility];  Service: Orthopedics;  Laterality: Left;   COLONOSCOPY     CYSTOSCOPY     DIAGNOSTIC LAPAROSCOPY     MINI SHUNT INSERTION  10/14/2011   Procedure: INSERTION OF MINI SHUNT;  Surgeon: Marylynn Pearson, MD;  Location: [facility];  Service: Ophthalmology;  Laterality: Right;   POLYPECTOMY     Tubaligation  1980   VAGINAL HYSTERECTOMY  1983    Social History   Socioeconomic History   Marital status: Married    Spouse name: Not on file   Number of children: 1   Years of education: Not on file   Highest education level: Not on file  Occupational History    Employer: [employer]  Tobacco Use   Smoking status: Former    Packs/day: 0.25    Years: 2.00    Total pack years: 0.50    Types: Cigarettes  Quit date: 10/11/1993    Years since quitting: 28.2    Passive exposure: Never   Smokeless tobacco: Never  Vaping Use   Vaping Use: Never used  Substance and Sexual Activity   Alcohol use: Not Currently   Drug use: No   Sexual activity: Not on file  Other Topics Concern   Not on file  Social History Narrative   Not on file   Social Determinants of Health   Financial Resource Strain: Not on file  Food Insecurity: Not on file  Transportation Needs: Not on file  Physical Activity: Not on file  Stress: Not on file  Social Connections: Not on file  Intimate Partner Violence: Not on file    Outpatient Encounter Medications as of 01/21/2022  Medication Sig   Aspirin 81 MG CAPS Aspirin 81 mg   busPIRone (BUSPAR) 5 MG tablet Take 5 mg by mouth 3 (three) times daily.    calcium carbonate 1250 MG capsule Take 1,250 mg by mouth 2 (two) times daily with a meal.   clotrimazole-betamethasone (LOTRISONE) cream Apply 1 Application topically daily.   Cyanocobalamin (B-12 PO) Take by mouth.   cyclobenzaprine (FLEXERIL) 5 MG tablet Take 1 tablet (5 mg total) by mouth 3 (three) times daily as needed for muscle spasms.   folic acid (FOLVITE) 540 MCG tablet Take 400 mcg by mouth daily.   hydrOXYzine (ATARAX/VISTARIL) 10 MG tablet Take 1 tablet (10 mg total) by mouth 3 (three) times daily as needed for anxiety.   MAGNESIUM PO Take by mouth 3 times/day as needed-between meals & bedtime.   Multiple Vitamin (MULTIVITAMIN) tablet Take 1 tablet by mouth daily.     ondansetron (ZOFRAN) 4 MG tablet Take 1 tablet (4 mg total) by mouth every 8 (eight) hours as needed for nausea or vomiting.   predniSONE (DELTASONE) 5 MG tablet TAKE 1 TABLET BY MOUTH EVERY DAY WITH BREAKFAST   PROAIR HFA 108 (90 Base) MCG/ACT inhaler TAKE 2 PUFFS BY MOUTH EVERY 6 HOURS AS NEEDED FOR WHEEZE OR SHORTNESS OF BREATH   Probiotic Product (PROBIOTIC DAILY PO) Take by mouth daily.   Tofacitinib Citrate ER (XELJANZ XR) 11 MG TB24 Take 11 mg by mouth daily.   TURMERIC PO Take by mouth.   [DISCONTINUED] naproxen (NAPROSYN) 500 MG tablet TAKE 1 TABLET BY MOUTH 2 TIMES DAILY WITH A MEAL.   omeprazole (PRILOSEC) 20 MG capsule Take 1 capsule (20 mg total) by mouth 2 (two) times daily before a meal. (Needs to be seen before next refill) (Patient taking differently: Take 20 mg by mouth as needed. (Needs to be seen before next refill))   No facility-administered encounter medications on file as of 01/21/2022.    Allergies  Allergen Reactions   Codeine Nausea And Vomiting    Review of Systems  Constitutional:  Negative for activity change, appetite change, chills, diaphoresis, fatigue, fever, unexpected weight change and weight loss.  HENT: Negative.  Negative for trouble swallowing.   Eyes: Negative.  Negative  for photophobia and visual disturbance.  Respiratory:  Negative for cough, chest tightness and shortness of breath.   Cardiovascular:  Negative for chest pain, palpitations, leg swelling and syncope.  Gastrointestinal:  Negative for abdominal pain, blood in stool, constipation, diarrhea, nausea and vomiting.  Endocrine: Negative.   Genitourinary:  Negative for decreased urine volume, difficulty urinating, dysuria, frequency and urgency.  Musculoskeletal:  Positive for arthralgias, back pain, joint swelling, neck pain and neck stiffness. Negative for gait problem and myalgias.  Skin:  Positive for rash.  Allergic/Immunologic: Negative.   Neurological:  Negative for dizziness, tingling, tremors, seizures, syncope, facial asymmetry, speech difficulty, weakness, light-headedness, numbness and headaches.  Hematological: Negative.   Psychiatric/Behavioral:  Negative for confusion, hallucinations, sleep disturbance and suicidal ideas.   All other systems reviewed and are negative.       Objective:  BP 119/73   Pulse 80   Temp (!) 95.1 F (35.1 C) (Temporal)   Ht '5\' 1"'$  (1.549 m)   Wt 117 lb 9.6 oz (53.3 kg)   SpO2 96%   BMI 22.22 kg/m    Wt Readings from Last 3 Encounters:  01/21/22 117 lb 9.6 oz (53.3 kg)  10/10/21 116 lb 9.6 oz (52.9 kg)  06/30/21 118 lb (53.5 kg)    Physical Exam Vitals and nursing note reviewed.  Constitutional:      General: She is not in acute distress.    Appearance: Normal appearance. She is not ill-appearing, toxic-appearing or diaphoretic.  HENT:     Head: Normocephalic and atraumatic.     Mouth/Throat:     Mouth: Mucous membranes are moist.  Eyes:     Conjunctiva/sclera: Conjunctivae normal.     Pupils: Pupils are equal, round, and reactive to light.  Cardiovascular:     Rate and Rhythm: Normal rate and regular rhythm.     Heart sounds: Normal heart sounds.  Pulmonary:     Effort: Pulmonary effort is normal.     Breath sounds: Normal breath  sounds.  Musculoskeletal:     Right shoulder: Normal.     Left shoulder: Normal.     Cervical back: Neck supple. Spasms and tenderness present. No swelling, edema, deformity, erythema, signs of trauma, lacerations, rigidity, torticollis, bony tenderness or crepitus. No pain with movement. Decreased range of motion.     Thoracic back: Normal.     Lumbar back: Normal.  Skin:    General: Skin is warm and dry.     Capillary Refill: Capillary refill takes less than 2 seconds.     Findings: Rash (erythematous rash to folds of bilateral ears) present.  Neurological:     General: No focal deficit present.     Mental Status: She is alert and oriented to person, place, and time.  Psychiatric:        Mood and Affect: Mood normal.        Behavior: Behavior normal.        Thought Content: Thought content normal.        Judgment: Judgment normal.     Results for orders placed or performed in visit on 10/10/21  Sedimentation rate  Result Value Ref Range   Sed Rate 11 0 - 30 mm/h  CBC with Differential/Platelet  Result Value Ref Range   WBC 5.5 3.8 - 10.8 Thousand/uL   RBC 4.43 3.80 - 5.10 Million/uL   Hemoglobin 13.8 11.7 - 15.5 g/dL   HCT 40.4 35.0 - 45.0 %   MCV 91.2 80.0 - 100.0 fL   MCH 31.2 27.0 - 33.0 pg   MCHC 34.2 32.0 - 36.0 g/dL   RDW 12.7 11.0 - 15.0 %   Platelets 175 140 - 400 Thousand/uL   MPV 11.3 7.5 - 12.5 fL   Neutro Abs 3,410 1,500 - 7,800 cells/uL   Lymphs Abs 1,540 850 - 3,900 cells/uL   Absolute Monocytes 451 200 - 950 cells/uL   Eosinophils Absolute 72 15 - 500 cells/uL   Basophils Absolute 28 0 - 200 cells/uL  Neutrophils Relative % 62 %   Total Lymphocyte 28.0 %   Monocytes Relative 8.2 %   Eosinophils Relative 1.3 %   Basophils Relative 0.5 %  COMPLETE METABOLIC PANEL WITH GFR  Result Value Ref Range   Glucose, Bld 64 (L) 65 - 99 mg/dL   BUN 11 7 - 25 mg/dL   Creat 0.85 0.50 - 1.05 mg/dL   eGFR 77 > OR = 60 mL/min/1.11m   BUN/Creatinine Ratio SEE  NOTE: 6 - 22 (calc)   Sodium 144 135 - 146 mmol/L   Potassium 4.0 3.5 - 5.3 mmol/L   Chloride 106 98 - 110 mmol/L   CO2 31 20 - 32 mmol/L   Calcium 9.9 8.6 - 10.4 mg/dL   Total Protein 6.7 6.1 - 8.1 g/dL   Albumin 4.3 3.6 - 5.1 g/dL   Globulin 2.4 1.9 - 3.7 g/dL (calc)   AG Ratio 1.8 1.0 - 2.5 (calc)   Total Bilirubin 0.4 0.2 - 1.2 mg/dL   Alkaline phosphatase (APISO) 76 37 - 153 U/L   AST 13 10 - 35 U/L   ALT 11 6 - 29 U/L     X-Ray: c-spine: significant for degenerative changes. Preliminary x-ray reading by MMonia Pouch FNP-C, [facility].   Pertinent labs & imaging results that were available during my care of the patient were reviewed by me and considered in my medical decision making.  Assessment & Plan:  KEllynwas seen today for neck pain.  Diagnoses and all orders for this visit:  Neck pain Degenerative changes on imaging, will refer back to spinal surgery. Will notify pt if radiology reading differs. Short course of muscle relaxer to help. Report new, worsening, or persistent symptoms.  -     DG Cervical Spine Complete -     cyclobenzaprine (FLEXERIL) 5 MG tablet; Take 1 tablet (5 mg total) by mouth 3 (three) times daily as needed for muscle spasms. -     Ambulatory referral to Spine Surgery  Flexural atopic dermatitis -     clotrimazole-betamethasone (LOTRISONE) cream; Apply 1 Application topically daily.     Continue all other maintenance medications.  Follow up plan: Return if symptoms worsen or fail to improve.   Continue healthy lifestyle choices, including diet (rich in fruits, vegetables, and lean proteins, and low in salt and simple carbohydrates) and exercise (at least 30 minutes of moderate physical activity daily).  Educational handout given for cervical strain  The above assessment and management plan was discussed with the patient. The patient verbalized understanding of and has agreed to the management plan. Patient is aware to call the clinic if  they develop any new symptoms or if symptoms persist or worsen. Patient is aware when to return to the clinic for a follow-up visit. Patient educated on when it is appropriate to go to the emergency department.   MMonia Pouch FNP-C [facility] [phone]

## 2022-02-02 DIAGNOSIS — M47812 Spondylosis without myelopathy or radiculopathy, cervical region: Secondary | ICD-10-CM | POA: Diagnosis not present

## 2022-02-04 ENCOUNTER — Other Ambulatory Visit: Payer: Self-pay | Admitting: Internal Medicine

## 2022-02-04 ENCOUNTER — Telehealth: Payer: Self-pay | Admitting: Internal Medicine

## 2022-02-04 DIAGNOSIS — M069 Rheumatoid arthritis, unspecified: Secondary | ICD-10-CM

## 2022-02-04 NOTE — Telephone Encounter (Signed)
Next Visit: was due 01/2022, message sent to the front desk to schedule   Last Visit: 10/10/2021  Last Fill: 11/10/2021  DX: Rheumatoid arthritis involving both hands, unspecified whether rheumatoid factor present   Current Dose per office note on 10/10/2021: prednisone '5mg'$  po qd   Okay to refill prednisone?

## 2022-02-04 NOTE — Telephone Encounter (Signed)
Patient states she is currently having neck injections at [facility] and will reschedule once she has completed the treatments.

## 2022-02-04 NOTE — Telephone Encounter (Signed)
-----  Message from [place] sent at 02/04/2022  8:18 AM EST ----- Patient was due for a follow up in 01/2022. Please call patient to schedule. Thanks!

## 2022-02-12 DIAGNOSIS — M47812 Spondylosis without myelopathy or radiculopathy, cervical region: Secondary | ICD-10-CM | POA: Diagnosis not present

## 2022-02-12 HISTORY — PX: OTHER SURGICAL HISTORY: SHX169

## 2022-03-04 ENCOUNTER — Ambulatory Visit: Payer: 59 | Admitting: Internal Medicine

## 2022-03-05 ENCOUNTER — Encounter: Payer: Self-pay | Admitting: Radiology

## 2022-03-12 DIAGNOSIS — M47812 Spondylosis without myelopathy or radiculopathy, cervical region: Secondary | ICD-10-CM | POA: Diagnosis not present

## 2022-03-12 HISTORY — PX: OTHER SURGICAL HISTORY: SHX169

## 2022-03-26 ENCOUNTER — Ambulatory Visit: Payer: 59 | Admitting: Internal Medicine

## 2022-04-04 ENCOUNTER — Other Ambulatory Visit: Payer: Self-pay | Admitting: Internal Medicine

## 2022-04-04 DIAGNOSIS — M069 Rheumatoid arthritis, unspecified: Secondary | ICD-10-CM

## 2022-04-10 ENCOUNTER — Encounter: Payer: Self-pay | Admitting: Family Medicine

## 2022-04-10 ENCOUNTER — Ambulatory Visit (INDEPENDENT_AMBULATORY_CARE_PROVIDER_SITE_OTHER): Payer: 59

## 2022-04-10 ENCOUNTER — Ambulatory Visit: Payer: 59 | Admitting: Family Medicine

## 2022-04-10 VITALS — BP 99/61 | HR 73 | Temp 98.0°F | Ht 61.0 in | Wt 113.2 lb

## 2022-04-10 DIAGNOSIS — R0602 Shortness of breath: Secondary | ICD-10-CM | POA: Diagnosis not present

## 2022-04-10 DIAGNOSIS — R5383 Other fatigue: Secondary | ICD-10-CM | POA: Diagnosis not present

## 2022-04-10 DIAGNOSIS — R5381 Other malaise: Secondary | ICD-10-CM | POA: Diagnosis not present

## 2022-04-10 NOTE — Progress Notes (Signed)
Subjective:  Patient ID: Katie [name], female    DOB: 05-28-1960, 62 y.o.   MRN: [id number]  Patient Care Team: Sonny Masters, FNP as PCP - General (Family Medicine)   Chief Complaint:  Shortness of Breath (SOB with activity x 2 weeks after having stomach bug )   HPI: Katie [name] is a 62 y.o. female presenting on 04/10/2022 for Shortness of Breath (SOB with activity x 2 weeks after having stomach bug )   Pt presents today for evaluation of fatigue/malaise and intermittent shortness of breath. States she has been under a lot of stress with increased anxiety due to family issues. She denies other associated symptoms. States the symptoms started after having a GI bug for a few days.   Shortness of Breath This is a new problem. The problem occurs intermittently. The problem has been waxing and waning. Pertinent negatives include no abdominal pain, chest pain, claudication, coryza, ear pain, fever, headaches, hemoptysis, leg pain, leg swelling, neck pain, orthopnea, PND, rash, rhinorrhea, sore throat, sputum production, swollen glands, syncope, vomiting or wheezing. Nothing aggravates the symptoms. The patient has no known risk factors for DVT/PE. She has tried nothing for the symptoms.    Relevant past medical, surgical, family, and social history reviewed and updated as indicated.  Allergies and medications reviewed and updated. Data reviewed: Chart in Epic.   Past Medical History:  Diagnosis Date   Arthritis    RA   Asthma    no attack since childhood   Blood transfusion    Contact lens/glasses fitting    wears glasses or contacts   Cystocele    Depression    External hemorrhoids    Fibromyalgia    GERD (gastroesophageal reflux disease)    past hx    Glaucoma    RIGHT EYE   HPV (human papilloma virus) infection    Neuromuscular disorder    fibromyalgia   Syncope 2015   Pt questioned seizure with this episode - nothing like since - vaso vagal response    Wears  dentures    top    Past Surgical History:  Procedure Laterality Date   ABDOMINAL EXPLORATION SURGERY  1984   bleed after hyst   APPENDECTOMY  1983   BLADDER SUSPENSION  2010   BREAST ENHANCEMENT SURGERY  2000   CARPAL TUNNEL RELEASE  1995   rt   CARPAL TUNNEL RELEASE Left 12/09/2012   Procedure: LEFT LIMITED OPEN CARPAL TUNNEL RELEASE;  Surgeon: Dominica Severin, MD;  Location: [facility];  Service: Orthopedics;  Laterality: Left;   COLONOSCOPY     CYSTOSCOPY     DIAGNOSTIC LAPAROSCOPY     MINI SHUNT INSERTION  10/14/2011   Procedure: INSERTION OF MINI SHUNT;  Surgeon: Chalmers Guest, MD;  Location: [facility] OR;  Service: Ophthalmology;  Laterality: Right;   POLYPECTOMY     Tubaligation  1980   VAGINAL HYSTERECTOMY  1983    Social History   Socioeconomic History   Marital status: Married    Spouse name: Not on file   Number of children: 1   Years of education: Not on file   Highest education level: Not on file  Occupational History    Employer: [employer]  Tobacco Use   Smoking status: Former    Packs/day: 0.25    Years: 2.00    Additional pack years: 0.00    Total pack years: 0.50    Types: Cigarettes    Quit date:  10/11/1993    Years since quitting: 28.5    Passive exposure: Never   Smokeless tobacco: Never  Vaping Use   Vaping Use: Never used  Substance and Sexual Activity   Alcohol use: Not Currently   Drug use: No   Sexual activity: Not on file  Other Topics Concern   Not on file  Social History Narrative   Not on file   Social Determinants of Health   Financial Resource Strain: Not on file  Food Insecurity: Not on file  Transportation Needs: Not on file  Physical Activity: Not on file  Stress: Not on file  Social Connections: Not on file  Intimate Partner Violence: Not on file    Outpatient Encounter Medications as of 04/10/2022  Medication Sig   Aspirin 81 MG CAPS Aspirin 81 mg   busPIRone (BUSPAR) 5 MG tablet Take 5 mg by mouth 3  (three) times daily.   calcium carbonate 1250 MG capsule Take 1,250 mg by mouth 2 (two) times daily with a meal.   Cyanocobalamin (B-12 PO) Take by mouth.   cyclobenzaprine (FLEXERIL) 5 MG tablet Take 1 tablet (5 mg total) by mouth 3 (three) times daily as needed for muscle spasms.   folic acid (FOLVITE) 800 MCG tablet Take 400 mcg by mouth daily.   hydrOXYzine (ATARAX/VISTARIL) 10 MG tablet Take 1 tablet (10 mg total) by mouth 3 (three) times daily as needed for anxiety.   MAGNESIUM PO Take by mouth 3 times/day as needed-between meals & bedtime.   Multiple Vitamin (MULTIVITAMIN) tablet Take 1 tablet by mouth daily.     ondansetron (ZOFRAN) 4 MG tablet Take 1 tablet (4 mg total) by mouth every 8 (eight) hours as needed for nausea or vomiting.   prednisoLONE 5 MG TABS tablet Take 5 mg by mouth daily.   PROAIR HFA 108 (90 Base) MCG/ACT inhaler TAKE 2 PUFFS BY MOUTH EVERY 6 HOURS AS NEEDED FOR WHEEZE OR SHORTNESS OF BREATH   Probiotic Product (PROBIOTIC DAILY PO) Take by mouth daily.   Tofacitinib Citrate ER (XELJANZ XR) 11 MG TB24 Take 11 mg by mouth daily.   TURMERIC PO Take by mouth.   omeprazole (PRILOSEC) 20 MG capsule Take 1 capsule (20 mg total) by mouth 2 (two) times daily before a meal. (Needs to be seen before next refill) (Patient taking differently: Take 20 mg by mouth as needed. (Needs to be seen before next refill))   [DISCONTINUED] clotrimazole-betamethasone (LOTRISONE) cream Apply 1 Application topically daily. (Patient not taking: Reported on 04/10/2022)   [DISCONTINUED] predniSONE (DELTASONE) 5 MG tablet TAKE 1 TABLET BY MOUTH EVERY DAY WITH BREAKFAST   No facility-administered encounter medications on file as of 04/10/2022.    Allergies  Allergen Reactions   Codeine Nausea And Vomiting    Review of Systems  Constitutional:  Positive for fatigue. Negative for activity change, appetite change, chills, diaphoresis, fever and unexpected weight change.  HENT: Negative.  Negative  for ear pain, rhinorrhea and sore throat.   Eyes: Negative.  Negative for photophobia and visual disturbance.  Respiratory:  Positive for shortness of breath. Negative for cough, hemoptysis, sputum production, chest tightness and wheezing.   Cardiovascular:  Negative for chest pain, palpitations, orthopnea, claudication, leg swelling, syncope and PND.  Gastrointestinal:  Negative for abdominal pain, blood in stool, constipation, diarrhea, nausea and vomiting.  Endocrine: Negative.   Genitourinary:  Negative for decreased urine volume, difficulty urinating, dysuria, frequency and urgency.  Musculoskeletal:  Negative for arthralgias, myalgias and neck pain.  Skin: Negative.  Negative for rash.  Allergic/Immunologic: Negative.   Neurological:  Negative for dizziness, tremors, seizures, syncope, facial asymmetry, speech difficulty, weakness, light-headedness, numbness and headaches.  Hematological: Negative.   Psychiatric/Behavioral:  Positive for sleep disturbance. Negative for agitation, behavioral problems, confusion, decreased concentration, dysphoric mood, hallucinations, self-injury and suicidal ideas. The patient is nervous/anxious. The patient is not hyperactive.   All other systems reviewed and are negative.       Objective:  BP 99/61   Pulse 73   Temp 98 F (36.7 C) (Temporal)   Ht  (1.549 m)   Wt 113 lb 3.2 oz (51.3 kg)   SpO2 97%   BMI 21.39 kg/m    Wt Readings from Last 3 Encounters:  04/10/22 113 lb 3.2 oz (51.3 kg)  01/21/22 117 lb 9.6 oz (53.3 kg)  10/10/21 116 lb 9.6 oz (52.9 kg)    Physical Exam Vitals and nursing note reviewed.  Constitutional:      General: She is not in acute distress.    Appearance: Normal appearance. She is well-developed, well-groomed and normal weight. She is not ill-appearing, toxic-appearing or diaphoretic.  HENT:     Head: Normocephalic and atraumatic.     Jaw: There is normal jaw occlusion.     Right Ear: Hearing normal.      Left Ear: Hearing normal.     Nose: Nose normal.     Mouth/Throat:     Lips: Pink.     Mouth: Mucous membranes are moist.     Pharynx: Oropharynx is clear. Uvula midline.  Eyes:     General: Lids are normal.     Extraocular Movements: Extraocular movements intact.     Conjunctiva/sclera: Conjunctivae normal.     Pupils: Pupils are equal, round, and reactive to light.  Neck:     Thyroid: No thyroid mass, thyromegaly or thyroid tenderness.     Vascular: No carotid bruit or JVD.     Trachea: Trachea and phonation normal.  Cardiovascular:     Rate and Rhythm: Normal rate and regular rhythm.     Chest Wall: PMI is not displaced.     Pulses: Normal pulses.     Heart sounds: Normal heart sounds. No murmur heard.    No friction rub. No gallop.  Pulmonary:     Effort: Pulmonary effort is normal. No respiratory distress.     Breath sounds: Normal breath sounds. No wheezing.  Abdominal:     General: Bowel sounds are normal. There is no distension or abdominal bruit.     Palpations: Abdomen is soft. There is no hepatomegaly or splenomegaly.     Tenderness: There is no abdominal tenderness. There is no right CVA tenderness or left CVA tenderness.     Hernia: No hernia is present.  Musculoskeletal:     Cervical back: Normal range of motion and neck supple.     Right lower leg: No edema.     Left lower leg: No edema.  Lymphadenopathy:     Cervical: No cervical adenopathy.  Skin:    General: Skin is warm and dry.     Capillary Refill: Capillary refill takes less than 2 seconds.     Coloration: Skin is not cyanotic, jaundiced or pale.     Findings: No rash.  Neurological:     General: No focal deficit present.     Mental Status: She is alert and oriented to person, place, and time.     Sensory: Sensation is intact.  Motor: Motor function is intact.     Coordination: Coordination is intact.     Gait: Gait is intact.     Deep Tendon Reflexes: Reflexes are normal and symmetric.   Psychiatric:        Attention and Perception: Attention and perception normal.        Mood and Affect: Affect normal. Mood is anxious.        Speech: Speech normal.        Behavior: Behavior normal. Behavior is cooperative.        Thought Content: Thought content normal.        Cognition and Memory: Cognition and memory normal.        Judgment: Judgment normal.     Results for orders placed or performed in visit on 10/10/21  Sedimentation rate  Result Value Ref Range   Sed Rate 11 0 - 30 mm/h  CBC with Differential/Platelet  Result Value Ref Range   WBC 5.5 3.8 - 10.8 Thousand/uL   RBC 4.43 3.80 - 5.10 Million/uL   Hemoglobin 13.8 11.7 - 15.5 g/dL   HCT 47.8 29.5 - 62.1 %   MCV 91.2 80.0 - 100.0 fL   MCH 31.2 27.0 - 33.0 pg   MCHC 34.2 32.0 - 36.0 g/dL   RDW 30.8 65.7 - 84.6 %   Platelets 175 140 - 400 Thousand/uL   MPV 11.3 7.5 - 12.5 fL   Neutro Abs 3,410 1,500 - 7,800 cells/uL   Lymphs Abs 1,540 850 - 3,900 cells/uL   Absolute Monocytes 451 200 - 950 cells/uL   Eosinophils Absolute 72 15 - 500 cells/uL   Basophils Absolute 28 0 - 200 cells/uL   Neutrophils Relative % 62 %   Total Lymphocyte 28.0 %   Monocytes Relative 8.2 %   Eosinophils Relative 1.3 %   Basophils Relative 0.5 %  COMPLETE METABOLIC PANEL WITH GFR  Result Value Ref Range   Glucose, Bld 64 (L) 65 - 99 mg/dL   BUN 11 7 - 25 mg/dL   Creat 9.62 9.52 - 8.41 mg/dL   eGFR 77 > OR = 60 LK/GMW/1.02V2   BUN/Creatinine Ratio SEE NOTE: 6 - 22 (calc)   Sodium 144 135 - 146 mmol/L   Potassium 4.0 3.5 - 5.3 mmol/L   Chloride 106 98 - 110 mmol/L   CO2 31 20 - 32 mmol/L   Calcium 9.9 8.6 - 10.4 mg/dL   Total Protein 6.7 6.1 - 8.1 g/dL   Albumin 4.3 3.6 - 5.1 g/dL   Globulin 2.4 1.9 - 3.7 g/dL (calc)   AG Ratio 1.8 1.0 - 2.5 (calc)   Total Bilirubin 0.4 0.2 - 1.2 mg/dL   Alkaline phosphatase (APISO) 76 37 - 153 U/L   AST 13 10 - 35 U/L   ALT 11 6 - 29 U/L     X-Ray: CXR: No acute findings. Preliminary  x-ray reading by Kari Baars, FNP-C, [facility].  EKG: SR, 65, PR 156 ms, QT 360 ms, no ST-T changes or ectopy. No changes from previous EKG. Kari Baars, FNP-C  Pertinent labs & imaging results that were available during my care of the patient were reviewed by me and considered in my medical decision making.  Assessment & Plan:  Rita was seen today for shortness of breath.  Diagnoses and all orders for this visit:  Shortness of breath Malaise ad fatigue CXR and EKG unremarkable in office. Labs pending. Likely from increased stress and anxiety. Pt aware of red flags  which require emergent evaluation and treatment. Report new, worsening, or persistent symptoms. Further treatment if warranted.  -     DG Chest 2 View; Future -     EKG 12-Lead -     CBC with Differential/Platelet -     CMP14+EGFR -     Thyroid Panel With TSH     Continue all other maintenance medications.  Follow up plan: Return if symptoms worsen or fail to improve.   Continue healthy lifestyle choices, including diet (rich in fruits, vegetables, and lean proteins, and low in salt and simple carbohydrates) and exercise (at least 30 minutes of moderate physical activity daily).    The above assessment and management plan was discussed with the patient. The patient verbalized understanding of and has agreed to the management plan. Patient is aware to call the clinic if they develop any new symptoms or if symptoms persist or worsen. Patient is aware when to return to the clinic for a follow-up visit. Patient educated on when it is appropriate to go to the emergency department.   Kari BaarsMichelle Branden Vine, FNP-C [facility] [phone]

## 2022-04-11 LAB — THYROID PANEL WITH TSH
Free Thyroxine Index: 1.9 (ref 1.2–4.9)
T3 Uptake Ratio: 27 % (ref 24–39)
T4, Total: 7.1 ug/dL (ref 4.5–12.0)
TSH: 1.78 u[IU]/mL (ref 0.450–4.500)

## 2022-04-11 LAB — CMP14+EGFR
ALT: 20 IU/L (ref 0–32)
AST: 15 IU/L (ref 0–40)
Albumin/Globulin Ratio: 1.9 (ref 1.2–2.2)
Albumin: 4.1 g/dL (ref 3.9–4.9)
Alkaline Phosphatase: 92 IU/L (ref 44–121)
BUN/Creatinine Ratio: 16 (ref 12–28)
BUN: 10 mg/dL (ref 8–27)
Bilirubin Total: 0.2 mg/dL (ref 0.0–1.2)
CO2: 24 mmol/L (ref 20–29)
Calcium: 9.5 mg/dL (ref 8.7–10.3)
Chloride: 105 mmol/L (ref 96–106)
Creatinine, Ser: 0.64 mg/dL (ref 0.57–1.00)
Globulin, Total: 2.2 g/dL (ref 1.5–4.5)
Glucose: 65 mg/dL — ABNORMAL LOW (ref 70–99)
Potassium: 3.8 mmol/L (ref 3.5–5.2)
Sodium: 142 mmol/L (ref 134–144)
Total Protein: 6.3 g/dL (ref 6.0–8.5)
eGFR: 99 mL/min/{1.73_m2} (ref >59)

## 2022-04-11 LAB — CBC WITH DIFFERENTIAL/PLATELET
Basophils Absolute: 0 10*3/uL (ref 0.0–0.2)
Basos: 0 %
EOS (ABSOLUTE): 0.1 10*3/uL (ref 0.0–0.4)
Eos: 2 %
Hematocrit: 38.7 % (ref 34.0–46.6)
Hemoglobin: 12.8 g/dL (ref 11.1–15.9)
Immature Grans (Abs): 0 10*3/uL (ref 0.0–0.1)
Immature Granulocytes: 1 %
Lymphocytes Absolute: 1.3 10*3/uL (ref 0.7–3.1)
Lymphs: 23 %
MCH: 30.8 pg (ref 26.6–33.0)
MCHC: 33.1 g/dL (ref 31.5–35.7)
MCV: 93 fL (ref 79–97)
Monocytes Absolute: 0.5 10*3/uL (ref 0.1–0.9)
Monocytes: 9 %
Neutrophils Absolute: 3.7 10*3/uL (ref 1.4–7.0)
Neutrophils: 65 %
Platelets: 245 10*3/uL (ref 150–450)
RBC: 4.15 x10E6/uL (ref 3.77–5.28)
RDW: 13.2 % (ref 11.7–15.4)
WBC: 5.6 10*3/uL (ref 3.4–10.8)

## 2022-04-22 NOTE — Progress Notes (Signed)
Office Visit Note  Patient: Katie [name]             Date of Birth: 10/25/1960           MRN: [id number]             PCP: Sonny Masters, FNP Referring: Sonny Masters, FNP Visit Date: 04/23/2022   Subjective:  Follow-up   History of Present Illness: Katie [name] is a 62 y.o. 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 Overall symptoms are doing well with this dose but still has some breakthrough symptoms with bilateral shoulder pain and some swelling in finger joints. She has noticed ongoing intermittent palpitation symptoms again despite remaining off Humira. Cardiac evaluation for this has been unremarkable so far no evidence of serious arrhythmia.   Previous HPI 10/10/21 Katie [name] is a 62 y.o. 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 She stopped Humira after last visit due to developing palpitations and pressure in her chest and neck after taking the medication. Since stopping it these symptoms completely went away.  She never recalls any similar symptoms in the past and has not had any with the prednisone.   Previous HPI 05/26/2021 Katie [name] is a 62 y.o. 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 Humira new start 04/10/2021.  Symptoms slightly improved with the medication so far still has soreness in right hand MCPs main affected area. After about 3 weeks since starting the medication she has noticed an increase in symptoms she feels are similar to palpitations or a panic attack and like there is pressure over the upper part of her chest.  These come and go within a few minutes but have been occurring pretty much daily for weeks.  She has used her husband's A-fib monitor to check and not seen any obvious rhythm abnormality testing at home.  She tried taking hydroxyzine as needed as previously prescribed for her did not notice any symptom improvement just sedation.   Previous  HPI 03/19/2021 Katie [name] is a 62 y.o. 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 Labs were checked at initial visit planning for trying biologic DMARD due to intolerance of oral medications. She has increased joint pain in multiple areas since stopping the low dose prednisone weeks ago. Worst in neck and shoulders and in right hand. Also started having a faint skin rash around her neck and collar area in the past week.   Previous HPI 02/26/21 Katie [name] is a 62 y.o. female here for seronegative RA. She saw Dr. Kellie Simmering in 2017-2018 tried a few medications without much success but had good response to low dose prednisone. She did not tolerate methotrexate or leflunomide with liver function changes and on methotrexate also had significant hair loss. She saw Dr. Dierdre Forth once for this problem but did not follow up for any long term treatment. She is currently on prednisone 5 mg daily which is controlling symptoms mostly but has a lot of pain any time she comes off this. She has noticed some increase in forward bending of neck and also has a chronic enlarged right supraclavicular lymph node for years.  Bone densitometry from 2015 showing osteopenia t-score -1.4.   DMARD Hx Humira- Palpitations? MTX- LFT changes, alopecia LEF- LFT changes   Review of Systems  Constitutional:  Positive for fatigue.  HENT:  Negative for mouth sores and mouth dryness.   Eyes:  Positive for dryness.  Respiratory:  Negative for shortness of breath.   Cardiovascular:  Positive for palpitations. Negative for chest pain.  Gastrointestinal:  Negative for blood in stool, constipation and diarrhea.  Endocrine: Negative for increased urination.  Genitourinary:  Negative for involuntary urination.  Musculoskeletal:  Positive for joint pain, joint pain, joint swelling, myalgias, morning stiffness and myalgias. Negative for gait problem, muscle weakness and muscle tenderness.  Skin:   Negative for color change, rash, hair loss and sensitivity to sunlight.  Allergic/Immunologic: Positive for susceptible to infections.  Neurological:  Negative for dizziness and headaches.  Hematological:  Negative for swollen glands.  Psychiatric/Behavioral:  Positive for depressed mood and sleep disturbance. The patient is nervous/anxious.     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 03/19/2021   High risk medication use 02/26/2021   Decreased libido 06/02/2018   Depression, recurrent (HCC) 05/27/2018   GAD (generalized anxiety disorder) 05/27/2018   Gastroesophageal reflux disease without esophagitis 05/27/2018   RA (rheumatoid arthritis) (HCC) 05/27/2016    Past Medical History:  Diagnosis Date   Arthritis    RA   Asthma    no attack since childhood   Blood transfusion    Contact lens/glasses fitting    wears glasses or contacts   Cystocele    Depression    External hemorrhoids    Fibromyalgia    GERD (gastroesophageal reflux disease)    past hx    Glaucoma    RIGHT EYE   HPV (human papilloma virus) infection    Neuromuscular disorder (HCC)    fibromyalgia   Syncope 2015   Pt questioned seizure with this episode - nothing like since - vaso vagal response    Wears dentures    top    Family History  Problem Relation Age of Onset   Hypertension Mother    Hypothyroidism Mother    Rheum arthritis Mother    Emphysema Father    Kidney disease Sister    Hypertension Sister    Bone cancer Daughter        Died at age 76   Celiac disease Daughter    Hypertension Daughter    Colon cancer Neg Hx    Colon polyps Neg Hx    Past Surgical History:  Procedure Laterality Date   ABDOMINAL EXPLORATION SURGERY  1984   bleed after hyst   APPENDECTOMY  1983   BLADDER SUSPENSION  2010   BREAST ENHANCEMENT SURGERY  2000   CARPAL TUNNEL RELEASE  1995   rt   CARPAL TUNNEL RELEASE Left  12/09/2012   Procedure: LEFT LIMITED OPEN CARPAL TUNNEL RELEASE;  Surgeon: Dominica Severin, MD;  Location: [facility];  Service: Orthopedics;  Laterality: Left;   COLONOSCOPY     CYSTOSCOPY     DIAGNOSTIC LAPAROSCOPY     Medial Branch Block  02/12/2022   Medial Branch Block  03/12/2022   MINI SHUNT INSERTION  10/14/2011   Procedure: INSERTION OF MINI SHUNT;  Surgeon: Chalmers Guest, MD;  Location: [facility] OR;  Service: Ophthalmology;  Laterality: Right;   POLYPECTOMY     Tubaligation  1980   VAGINAL HYSTERECTOMY  1983   Social History   Social History Narrative   Not on file   Immunization History  Administered Date(s) Administered   Influenza,inj,Quad PF,6+ Mos 11/08/2020  Influenza-Unspecified 11/07/2018, 11/08/2020   Moderna Sars-Covid-2 Vaccination 05/29/2019   Pneumococcal Conjugate-13 11/10/2018   Td 02/21/2018   Tdap 02/21/2018     Objective: Vital Signs: BP 112/70 (BP Location: Left Arm, Patient Position: Sitting, Cuff Size: Normal)   Pulse 64   Resp 12   Ht 5' (1.524 m)   Wt 112 lb (50.8 kg)   BMI 21.87 kg/m    Physical Exam Cardiovascular:     Rate and Rhythm: Normal rate and regular rhythm.  Pulmonary:     Effort: Pulmonary effort is normal.     Breath sounds: Normal breath sounds.  Lymphadenopathy:     Cervical: No cervical adenopathy.  Skin:    General: Skin is warm and dry.     Findings: No rash.  Neurological:     Mental Status: She is alert.  Psychiatric:        Mood and Affect: Mood normal.      Musculoskeletal Exam:  Shoulders full ROM, mild tenderness to pressure without palpable swelling Elbows full ROM no tenderness or swelling Wrists full ROM no tenderness or swelling Fingers full ROM swelling in right 2nd and 3rd MCPs with tenderness in 1st MCP as well Knees full ROM no tenderness or swelling   CDAI Exam: CDAI Score: 11  Patient Global: 30 mm; Provider Global: 10 mm Swollen: 2 ; Tender: 5  Joint Exam 04/23/2022       Right  Left  Glenohumeral   Tender   Tender  MCP 1   Tender     MCP 2  Swollen Tender     MCP 3  Swollen Tender         Investigation: No additional findings.  Imaging: No results found.  Recent Labs: Lab Results  Component Value Date   WBC 5.6 04/10/2022   HGB 12.8 04/10/2022   PLT 245 04/10/2022   NA 142 04/10/2022   K 3.8 04/10/2022   CL 105 04/10/2022   CO2 24 04/10/2022   GLUCOSE 65 (L) 04/10/2022   BUN 10 04/10/2022   CREATININE 0.64 04/10/2022   BILITOT 0.2 04/10/2022   ALKPHOS 92 04/10/2022   AST 15 04/10/2022   ALT 20 04/10/2022   PROT 6.3 04/10/2022   ALBUMIN 4.1 04/10/2022   CALCIUM 9.5 04/10/2022   GFRAA 89 05/27/2018   QFTBGOLDPLUS NEGATIVE 04/23/2022    Speciality Comments: MTX stopped due to elevated LFTs/hairloss; leflunomide stopped due to elevated LFTs, Humira started 04/10/21  Procedures:  No procedures performed Allergies: Codeine   Assessment / Plan:     Visit Diagnoses: Rheumatoid arthritis involving both hands, unspecified whether rheumatoid factor present (HCC) - Plan: Sedimentation rate, C-reactive protein  RA disease activity appears mild but is remaining on low-dose prednisone of any steroid sparing DMARD.  Will recheck sed rate and CRP for disease activity monitoring.  Interested in trying to get off the remaining prednisone and also with mild persistent symptoms.  The previous palpitation concerns about Humira does not appear to be medication related as these continued and subsequent months off of therapy.  However just in case of any persistent intolerance and as a monotherapy would recommend starting on Enbrel 50 mg subcu weekly.  Continue prednisone 5 mg daily for now with aim to taper dose getting established on new DMARD.  High risk medication use - Plan: QuantiFERON-TB Gold Plus  Has had recent labs reviewed with CBC and CMP are fine.  Will check updated QuantiFERON for baseline TB screening before starting Enbrel.  Reviewed risk of  medication including injection site reactions, infections, long-term increased risk for lymphoma and nonmelanoma skin cancer.  Does not have any personal history of these malignancy no history of congestive heart failure.  Orders: Orders Placed This Encounter  Procedures   Sedimentation rate   C-reactive protein   QuantiFERON-TB Gold Plus   No orders of the defined types were placed in this encounter.    Follow-Up Instructions: Return in about 3 months (around 07/23/2022) for RA ENB/GC f/u 3mos.   Fuller Plan, MD  Note - This record has been created using [employer].  Chart creation errors have been sought, but may not always  have been located. Such creation errors do not reflect on  the standard of medical care.

## 2022-04-23 ENCOUNTER — Encounter: Payer: Self-pay | Admitting: Internal Medicine

## 2022-04-23 ENCOUNTER — Telehealth: Payer: Self-pay | Admitting: Pharmacist

## 2022-04-23 ENCOUNTER — Ambulatory Visit: Payer: 59 | Attending: Internal Medicine | Admitting: Internal Medicine

## 2022-04-23 VITALS — BP 112/70 | HR 64 | Resp 12 | Ht 60.0 in | Wt 112.0 lb

## 2022-04-23 DIAGNOSIS — M069 Rheumatoid arthritis, unspecified: Secondary | ICD-10-CM

## 2022-04-23 DIAGNOSIS — Z79899 Other long term (current) drug therapy: Secondary | ICD-10-CM

## 2022-04-23 LAB — SEDIMENTATION RATE: Sed Rate: 9 mm/h (ref 0–30)

## 2022-04-23 NOTE — Telephone Encounter (Signed)
Pending OV note from today, please start Enbrel SURECLICK BIV  Dose: 50mg  SQ every 7 days Dx: RA  Patient will be starting at home. Was trained on Sureclick device at OV today  Chesley Mires, PharmD, MPH, BCPS, CPP Clinical Pharmacist (Rheumatology and Pulmonology)

## 2022-04-23 NOTE — Patient Instructions (Signed)
Please ensure yearly skin checks with dermatology while on Enbrel  Etanercept Injection What is this medication? ETANERCEPT (et a [employer]) treats autoimmune conditions, such as psoriasis and certain types of arthritis. It works by slowing down an overactive immune system. It belongs to a group of medications called TNF inhibitors. This medicine may be used for other purposes; ask your health care provider or pharmacist if you have questions. COMMON BRAND NAME(S): Enbrel What should I tell my care team before I take this medication? They need to know if you have any of these conditions: Bleeding disorder Cancer Diabetes Granulomatosis with polyangiitis Heart failure HIV or AIDS Immune system problems Infection, such as tuberculosis (TB) or other bacterial, fungal or viral infections Liver disease Nervous system problems, such as Guillain-Barre syndrome, multiple sclerosis or seizures Recent or upcoming vaccine An unusual or allergic reaction to etanercept, other medications, food, dyes, or preservatives Pregnant or trying to get pregnant Breastfeeding How should I use this medication? The medication is injected under the skin. You will be taught how to prepare and give it. Take it as directed on the prescription label. Keep taking it unless your care team tells you stop. This medication comes with INSTRUCTIONS FOR USE. Ask your pharmacist for directions on how to use this medication. Read the information carefully. Talk to your pharmacist or care team if you have questions. If you use a pen, be sure to take off the outer needle cover before using the dose. It is important that you put your used needles and syringes in a special sharps container. Do not put them in a trash can. If you do not have a sharps container, call your pharmacist or care team to get one. A special MedGuide will be given to you by the pharmacist with each prescription and refill. Be sure to read this information  carefully each time. Talk to your care team about the use of this medication in children. While it may be prescribed for children as young as 3 years of age for selected conditions, precautions do apply. Overdosage: If you think you have taken too much of this medicine contact a poison control center or emergency room at once. NOTE: This medicine is only for you. Do not share this medicine with others. What if I miss a dose? If you miss a dose, take it as soon as you can. If it is almost time for your next dose, take only that dose. Do not take double or extra doses. What may interact with this medication? Do not take this medication with any of the following: Biologic medications, such as adalimumab, certolizumab, golimumab, infliximab Live vaccines Rilonacept This medication may also interact with the following: Abatacept [place] Biologic medications, such as anifrolumab, baricitinib, belimumab, canakinumab, natalizumab, rituximab, sarilumab, tocilizumab, tofacitinib, upadacitinib, vedolizumab Cyclophosphamide Sulfasalazine This list may not describe all possible interactions. Give your health care provider a list of all the medicines, herbs, non-prescription drugs, or dietary supplements you use. Also tell them if you smoke, drink alcohol, or use illegal drugs. Some items may interact with your medicine. What should I watch for while using this medication? Visit your care team for regular checks on your progress. Tell your care team if your symptoms do not start to get better or if they get worse. This medication may increase your risk of getting an infection. Call your care team for advice if you get a fever, chills, sore throat, or other symptoms of a cold or flu. Do not treat  yourself. Try to avoid being around people who are sick. If you have not had the measles or chickenpox vaccines, tell your care team right away if you are around someone with these viruses. You will be tested for  tuberculosis (TB) before you start this medication. If your care team prescribes any medication for TB, you should start taking the TB medication before starting this medication. Make sure to finish the full course of TB medication. Avoid taking medications that contain aspirin, acetaminophen, ibuprofen, naproxen, or ketoprofen unless instructed by your care team. These medications may hide fever. Talk to your care team about your risk of cancer. You may be more at risk for certain types of cancer if you take this medication. This medication can decrease the response to a vaccine. If you need to get vaccinated, tell your care team if you have received this medication. Extra booster doses may be needed. Talk to your care team to see if a different vaccination schedule is needed. What side effects may I notice from receiving this medication? Side effects that you should report to your care team as soon as possible: Allergic reactions--skin rash, itching, hives, swelling of the face, lips, tongue, or throat Body pain, tingling, or numbness Eye pain, change in vision, vision loss Heart failure--shortness of breath, swelling of the ankles, feet, or hands, sudden weight gain, unusual weakness or fatigue Infection--fever, chills, cough, sore throat, wounds that don't heal, pain or trouble when passing urine, general feeling of discomfort or being unwell Liver injury--right upper belly pain, loss of appetite, nausea, light-colored stool, dark yellow or brown urine, yellowing skin or eyes, unusual weakness or fatigue Low red blood cell level--unusual weakness or fatigue, dizziness, headache, trouble breathing Lupus-like syndrome--joint pain, swelling, or stiffness, butterfly-shaped rash on the face, rashes that get worse in the sun, fever, unusual weakness or fatigue New or worsening psoriasis--rash with itchy, scaly patches Seizures Unusual bruising or bleeding Weakness in arms and legs Side effects that  usually do not require medical attention (report to your care team if they continue or are bothersome): Headache Pain, redness, or irritation at injection site Sinus pain or pressure around the face or forehead This list may not describe all possible side effects. Call your doctor for medical advice about side effects. You may report side effects to FDA at 1-800-FDA-1088. Where should I keep my medication? Keep out of the reach of children and pets. See product for storage information. Each product may have different instructions. Get rid of any unused medication after the expiration date. To get rid of medications that are no longer needed or have expired: Take the medication to a medication take-back program. Check with your pharmacy or law enforcement to find a location. If you cannot return the medication, ask your pharmacist or care team how to get rid of this medication safely. NOTE: This sheet is a summary. It may not cover all possible information. If you have questions about this medicine, talk to your doctor, pharmacist, or health care provider.  2023 Elsevier/Gold Standard (2021-06-18 00:00:00)

## 2022-04-23 NOTE — Progress Notes (Signed)
Pharmacy Note  Subjective: Patient presents today to the [facility] for follow up office visit.  Patient seen by the pharmacist for counseling on Enbrel for rheumatoid arthritis. Previously took Humira - experienced palpitations. Never took Harriette Ohara due to cost issues and patient's concern of side effects.  Diagnosis of heart failure: No  Objective:  CBC    Component Value Date/Time   WBC 5.6 04/10/2022 1235   WBC 5.5 10/10/2021 0838   RBC 4.15 04/10/2022 1235   RBC 4.43 10/10/2021 0838   HGB 12.8 04/10/2022 1235   HCT 38.7 04/10/2022 1235   PLT 245 04/10/2022 1235   MCV 93 04/10/2022 1235   MCH 30.8 04/10/2022 1235   MCH 31.2 10/10/2021 0838   MCHC 33.1 04/10/2022 1235   MCHC 34.2 10/10/2021 0838   RDW 13.2 04/10/2022 1235   LYMPHSABS 1.3 04/10/2022 1235   EOSABS 0.1 04/10/2022 1235   BASOSABS 0.0 04/10/2022 1235     CMP     Component Value Date/Time   NA 142 04/10/2022 1235   K 3.8 04/10/2022 1235   CL 105 04/10/2022 1235   CO2 24 04/10/2022 1235   GLUCOSE 65 (L) 04/10/2022 1235   GLUCOSE 64 (L) 10/10/2021 0838   BUN 10 04/10/2022 1235   CREATININE 0.64 04/10/2022 1235   CREATININE 0.85 10/10/2021 0838   CALCIUM 9.5 04/10/2022 1235   PROT 6.3 04/10/2022 1235   ALBUMIN 4.1 04/10/2022 1235   AST 15 04/10/2022 1235   ALT 20 04/10/2022 1235   ALKPHOS 92 04/10/2022 1235   BILITOT 0.2 04/10/2022 1235   GFRNONAA 77 05/27/2018 1445   GFRAA 89 05/27/2018 1445     Baseline Immunosuppressant Therapy Labs TB GOLD    Latest Ref Rng & Units 02/26/2021    9:05 AM  Quantiferon TB Gold  Quantiferon TB Gold Plus NEGATIVE NEGATIVE    Hepatitis Panel    Latest Ref Rng & Units 02/26/2021    9:05 AM  Hepatitis  Hep B Surface Ag NON-REACTIVE NON-REACTIVE   Hep B IgM NON-REACTIVE NON-REACTIVE    HIV Lab Results  Component Value Date   HIV Non Reactive 02/21/2018   Immunoglobulins   SPEP    Latest Ref Rng & Units 04/10/2022   12:35 PM  Serum Protein  Electrophoresis  Total Protein 6.0 - 8.5 g/dL 6.3    Chest x-ray: 04/10/38 - No acute cardiopulmonary process.  Assessment/Plan:  Counseled patient that Enbrel is a TNF blocking agent.  Counseled patient on purpose, proper use, and adverse effects of Enbrel.  Reviewed the most common adverse effects including infections, headache, and injection site reactions.  Discussed that there is the possibility of an increased risk of malignancy including non-melanoma skin cancer but it is not well understood if this increased risk is due to the medication or the disease state.  Advised patient to get yearly dermatology exams due to risk of skin cancer.  Counseled patient that Enbrel should be held prior to scheduled surgery.  Counseled patient to avoid live vaccines while on Enbrel.  Recommend annual influenza, PCV 15 or PCV20 or Pneumovax 23, and Shingrix as indicated.  Reviewed the importance of regular labs while on Enbrel therapy.  Will monitor CBC and CMP 1 month after starting and then every 3 months routinely thereafter. Will monitor TB gold annually. Standing orders placed. Provided patient with medication education material and answered all questions.  Patient consented to Enbrel.  Will upload consent into the media tab.  Reviewed storage instructions  for Enbrel.  She hsa been trained on  Patient confirms that she sees a dermatologist prn. She has been advised to establish yearly skin checks while on TNF inhibitor due to risk for non-melanoma skin cancer. Per Dr. Dimple Casey - she is okay to start Enbrel at home - will apply for Enbrel Sureclick  Chesley Mires, PharmD, MPH, BCPS, CPP Clinical Pharmacist (Rheumatology and Pulmonology)

## 2022-04-25 LAB — QUANTIFERON-TB GOLD PLUS
Mitogen-NIL: >10 IU/mL
NIL: 0.02 IU/mL
QuantiFERON-TB Gold Plus: NEGATIVE
TB1-NIL: 0.01 IU/mL
TB2-NIL: 0 IU/mL

## 2022-04-25 LAB — C-REACTIVE PROTEIN: CRP: <3 mg/L (ref <8.0)

## 2022-05-14 NOTE — Telephone Encounter (Signed)
Note is signed.

## 2022-05-15 NOTE — Telephone Encounter (Signed)
Submitted a Prior Authorization request to [facility] for ENBREL via CoverMyMeds. Will update once we receive a response.  Key: [id number]

## 2022-05-18 NOTE — Telephone Encounter (Signed)
Received fax from [facility] requesting proof of RF, ESR, anti-CCP, and CRP labs. Submitted via fax  Chesley Mires, PharmD, MPH, BCPS, CPP Clinical Pharmacist (Rheumatology and Pulmonology)

## 2022-05-26 ENCOUNTER — Telehealth: Payer: Self-pay | Admitting: *Deleted

## 2022-05-26 NOTE — Telephone Encounter (Signed)
Received message from the [facility] PA Department.   They called stating they need more information for the pending Enbrel PA.  They are requesting a call back at [phone].

## 2022-05-26 NOTE — Telephone Encounter (Signed)
Received a fax regarding Prior Authorization from [facility] for ENBREL. Authorization has been DENIED because you must try and fail conventional DMARD (patient has tried and failed both MTX and leflunomide)  Resubmitted Enbrel Sureclick PA on [facility]  Key: [id number]

## 2022-05-28 NOTE — Telephone Encounter (Signed)
Received another denial for Enbrel. Called [facility] for reasoning. Per Ngat, clinical pharmacist reviewer, patient has to have tried biologic in past 120 days. Additionally, she must try and fail other DMARDs, Plaquenil and sulfasalazine. Advised that these would not be as effective for her disease. Will have to move forward with appeal  Submitted an URGENT appeal to [employer] for ENBREL.  Reference # [id number] Phone: [phone] Fax: [phone]  Chesley Mires, PharmD, MPH, BCPS, CPP Clinical Pharmacist (Rheumatology and Pulmonology)

## 2022-06-09 ENCOUNTER — Other Ambulatory Visit: Payer: Self-pay | Admitting: Family Medicine

## 2022-06-09 DIAGNOSIS — L2089 Other atopic dermatitis: Secondary | ICD-10-CM

## 2022-06-10 NOTE — Telephone Encounter (Signed)
Received letter from [facility] stating that appeal has been received for patient's Enbrel on 05/28/22. Written response will be sent to provider within 30 calendar days of the date.  Phone: [phone]  Chesley Mires, PharmD, MPH, BCPS, CPP Clinical Pharmacist (Rheumatology and Pulmonology)

## 2022-06-15 ENCOUNTER — Telehealth: Payer: Self-pay

## 2022-06-15 DIAGNOSIS — M069 Rheumatoid arthritis, unspecified: Secondary | ICD-10-CM

## 2022-06-15 MED ORDER — PREDNISONE 5 MG PO TABS
5.00 mg | ORAL_TABLET | Freq: Every day | ORAL | 0 refills | Status: DC
Start: 2022-06-15 — End: 2022-07-13

## 2022-06-15 NOTE — Telephone Encounter (Signed)
Attempted to contact the patient and left a message to call the office back regarding lab results. 

## 2022-06-15 NOTE — Addendum Note (Signed)
Addended by: Fuller Plan on: 06/15/2022 04:58 PM   Modules accepted: Orders

## 2022-06-15 NOTE — Telephone Encounter (Signed)
Patient contacted the office and left a message inquiring if Dr. Dimple Casey could send her in prednisone. Patient states insurance denied her Enbrel. Patient inquires if there are any ideas of something else that she can take. Patient call back number is [phone]. Please advise.

## 2022-06-15 NOTE — Telephone Encounter (Signed)
New Rx sent for prednisone 5 mg daily for now. My understanding is currently waiting on Enbrel appeal process.

## 2022-06-16 NOTE — Telephone Encounter (Signed)
Patient returned call to the office. Patient advised Dr. Dimple Casey sent in a prescription for Prednisone 5 mg daily for now. Patient advised the Enbrel is in the appeal process and we have sent it to the pharmacy team for an update.

## 2022-06-16 NOTE — Telephone Encounter (Signed)
Spoke to patient about status of Enbrel appeal with [employer]. Notified her that status of appeal was downgraded from expedited to a regular appeal & expect to hear from insurance within 30 days from 05/28/22.

## 2022-06-16 NOTE — Telephone Encounter (Signed)
Pt provided with update regarding Enbrel appeal by Benna Dunks, PharmD Candidate today  Chesley Mires, PharmD, MPH, BCPS, CPP Clinical Pharmacist (Rheumatology and Pulmonology)

## 2022-06-30 ENCOUNTER — Other Ambulatory Visit (HOSPITAL_COMMUNITY): Payer: Self-pay

## 2022-06-30 MED ORDER — ENBREL SURECLICK 50 MG/ML ~~LOC~~ SOAJ
50.0000 mg | SUBCUTANEOUS | 0 refills | Status: DC
Start: 2022-06-30 — End: 2022-09-10

## 2022-06-30 NOTE — Telephone Encounter (Signed)
Received notification from [facility] regarding a prior authorization for ENBREL. Authorization has been APPROVED from 05/28/22 to 05/28/23. Approval letter sent to scan center.  Unable to run test claim because patient must fill [facility]: [phone]  Authorization # [id number]  BIN: [id number] PCN: [id number] Group: [id number] ID: [id number] She should receive physical card in the mail in 2 to 3 business days.  [facility] today and MyChart message sent to patient with further details.  Chesley Mires, PharmD, MPH, BCPS, CPP Clinical Pharmacist (Rheumatology and Pulmonology)

## 2022-06-30 NOTE — Telephone Encounter (Signed)
Spoke with patient to notify her that she has been approved for Enbrel. MyChart message sent to patient with copay card information and phone number for [facility] to coordinate shipment to home.   Darolyn Rua, PharmD Student [employer]

## 2022-07-13 ENCOUNTER — Other Ambulatory Visit: Payer: Self-pay | Admitting: Internal Medicine

## 2022-07-13 DIAGNOSIS — M069 Rheumatoid arthritis, unspecified: Secondary | ICD-10-CM

## 2022-07-13 NOTE — Telephone Encounter (Signed)
Please schedule patient a follow up visit. Patient due 07/23/2022. Thanks!  

## 2022-07-13 NOTE — Telephone Encounter (Signed)
Last Fill: 06/15/2022  Next Visit: Due 07/23/2022. Message sent to the front to schedule.   Last Visit: 04/23/2022  Dx: Rheumatoid arthritis involving both hands, unspecified whether rheumatoid factor present   Current Dose per office note on 04/23/2022: prednisone 5 mg daily for now with aim to taper dose getting established on new DMARD.   Okay to refill Prednisone?

## 2022-07-13 NOTE — Telephone Encounter (Signed)
Patient states Dr. Dimple Casey wanted her to schedule a 3 month follow-up after she began her Enbrel medication.  Patient states she took her first injection on 07/12/22.  Patient scheduled for Monday, 10/12/22.

## 2022-07-25 ENCOUNTER — Encounter: Payer: Self-pay | Admitting: Internal Medicine

## 2022-07-25 ENCOUNTER — Telehealth: Payer: Self-pay | Admitting: Rheumatology

## 2022-07-25 NOTE — Telephone Encounter (Signed)
I received a text from the answering service to contact Katie [name] at 12:03 PM.  I called patient back at 12:14 PM.  Patient stated that she took second dose of Enbrel subcu injection a week ago.  She is a scheduled to take next Enbrel injection tomorrow.  She stated when she woke up this morning she was achy all over and her temperature was 100.2.  She is uncertain if this is a reaction from Enbrel injection or she is coming up with an infection.  She has been also noticing some redness on her thighs at the previous infection sites.  She stated she is very sensitive to multiple medications.  She is uncertain if she will be able to tolerate Enbrel.  I advised her to hold off Enbrel injection and watch if she develops any symptoms of infection over the next few days.  I also discussed that she may be able to take Zyrtec over-the-counter 1 tablet 1 day prior to the injection, 1 tablet on the day of injection and 1 tablet after the injection to reduce the injection site reaction and may use topical hydrocortisone over the red area if Dr. Dimple Casey approves to continue Enbrel.  I advised her to take pictures of the rash on her legs and forward the pictures to Dr. Dimple Casey.  She will contact Dr. Dimple Casey on Monday to make a decision regarding continuing Enbrel or switching to some other therapy.  I also advised her that if she develops fever or any symptoms of infection she should be seen at the urgent care or by her primary care physician.  Patient voiced understanding. Pollyann Savoy MD

## 2022-07-27 NOTE — Telephone Encounter (Signed)
Patient is currently prescribed Enbrel. Please advise.

## 2022-08-07 ENCOUNTER — Encounter: Payer: Self-pay | Admitting: Family Medicine

## 2022-08-07 ENCOUNTER — Other Ambulatory Visit: Payer: Self-pay | Admitting: Internal Medicine

## 2022-08-07 ENCOUNTER — Ambulatory Visit: Payer: 59 | Admitting: Family Medicine

## 2022-08-07 VITALS — BP 107/62 | HR 73 | Temp 98.0°F | Ht 61.0 in | Wt 107.2 lb

## 2022-08-07 DIAGNOSIS — F411 Generalized anxiety disorder: Secondary | ICD-10-CM

## 2022-08-07 DIAGNOSIS — F339 Major depressive disorder, recurrent, unspecified: Secondary | ICD-10-CM

## 2022-08-07 DIAGNOSIS — M069 Rheumatoid arthritis, unspecified: Secondary | ICD-10-CM

## 2022-08-07 MED ORDER — BUSPIRONE HCL 10 MG PO TABS
10.0000 mg | ORAL_TABLET | Freq: Two times a day (BID) | ORAL | 1 refills | Status: DC
Start: 2022-08-07 — End: 2023-01-25

## 2022-08-07 MED ORDER — ESCITALOPRAM OXALATE 10 MG PO TABS
10.0000 mg | ORAL_TABLET | Freq: Every day | ORAL | 5 refills | Status: DC
Start: 2022-08-07 — End: 2022-08-31

## 2022-08-07 NOTE — Telephone Encounter (Signed)
Last Fill: 07/13/2022  Next Visit: 10/12/2022  Last Visit: 04/23/2022  Dx: Rheumatoid arthritis involving both hands, unspecified whether rheumatoid factor present   Current Dose per office note on 04/23/2022: prednisone 5 mg daily   Okay to refill Prednisone?

## 2022-08-07 NOTE — Progress Notes (Signed)
Subjective:  Patient ID: Katie [name], female    DOB: 06-29-1960, 62 y.o.   MRN: [id number]  Patient Care Team: Sonny Masters, FNP as PCP - General (Family Medicine)   Chief Complaint:  Anxiety (Patient states that her anxiety is higher due to things going on in her family. )   HPI: Katie [name] is a 62 y.o. female presenting on 08/07/2022 for Anxiety (Patient states that her anxiety is higher due to things going on in her family. )   1. GAD (generalized anxiety disorder) 2. Depression, recurrent (HCC) Pt reports her anxiety has increased significantly over the last few months due to family issues. She has not been taking the atarax due to making her sleepy. Feels the buspar is not beneficial. Reports she has been on lexapro in the past with great results.     08/07/2022    8:06 AM 04/10/2022   11:56 AM 01/21/2022   11:16 AM 06/30/2021   10:40 AM 02/11/2021    8:28 AM  Depression screen PHQ 2/9  Decreased Interest 2 0 2 1 0  Down, Depressed, Hopeless 2 0 1 0 0  PHQ - 2 Score 4 0 3 1 0  Altered sleeping 3 3 2  0 1  Tired, decreased energy 1 3 2 2 1   Change in appetite 1 1 0 0 0  Feeling bad or failure about yourself  0 0 0 0 0  Trouble concentrating 1 0 2 0 1  Moving slowly or fidgety/restless 2 1 0 0 0  Suicidal thoughts 0 0 0 0 0  PHQ-9 Score 12 8 9 3 3   Difficult doing work/chores Very difficult Not difficult at all Somewhat difficult Not difficult at all Not difficult at all      08/07/2022    8:06 AM 04/10/2022   11:56 AM 01/21/2022   11:16 AM 06/30/2021   10:41 AM  GAD 7 : Generalized Anxiety Score  Nervous, Anxious, on Edge 3 2 2  0  Control/stop worrying 3 3 3 2   Worry too much - different things 3 3 3 2   Trouble relaxing 3 1 2 1   Restless 3 0 2 0  Easily annoyed or irritable 2 2 2 2   Afraid - awful might happen 3 2 2 2   Total GAD 7 Score 20 13 16 9   Anxiety Difficulty Very difficult Not difficult at all Somewhat difficult Not difficult at all          Relevant past medical, surgical, family, and social history reviewed and updated as indicated.  Allergies and medications reviewed and updated. Data reviewed: Chart in Epic.   Past Medical History:  Diagnosis Date   Arthritis    RA   Asthma    no attack since childhood   Blood transfusion    Contact lens/glasses fitting    wears glasses or contacts   Cystocele    Depression    External hemorrhoids    Fibromyalgia    GERD (gastroesophageal reflux disease)    past hx    Glaucoma    RIGHT EYE   HPV (human papilloma virus) infection    Neuromuscular disorder (HCC)    fibromyalgia   Syncope 2015   Pt questioned seizure with this episode - nothing like since - vaso vagal response    Wears dentures    top    Past Surgical History:  Procedure Laterality Date   ABDOMINAL EXPLORATION SURGERY  1984   bleed after hyst  APPENDECTOMY  1983   BLADDER SUSPENSION  2010   BREAST ENHANCEMENT SURGERY  2000   CARPAL TUNNEL RELEASE  1995   rt   CARPAL TUNNEL RELEASE Left 12/09/2012   Procedure: LEFT LIMITED OPEN CARPAL TUNNEL RELEASE;  Surgeon: Dominica Severin, MD;  Location: [facility];  Service: Orthopedics;  Laterality: Left;   COLONOSCOPY     CYSTOSCOPY     DIAGNOSTIC LAPAROSCOPY     Medial Branch Block  02/12/2022   Medial Branch Block  03/12/2022   MINI SHUNT INSERTION  10/14/2011   Procedure: INSERTION OF MINI SHUNT;  Surgeon: Chalmers Guest, MD;  Location: [facility];  Service: Ophthalmology;  Laterality: Right;   POLYPECTOMY     Tubaligation  1980   VAGINAL HYSTERECTOMY  1983    Social History   Socioeconomic History   Marital status: Married    Spouse name: Not on file   Number of children: 1   Years of education: Not on file   Highest education level: Not on file  Occupational History    Employer: [employer]  Tobacco Use   Smoking status: Former    Current packs/day: 0.00    Average packs/day: 0.3 packs/day for 2.0 years (0.5 ttl pk-yrs)     Types: Cigarettes    Start date: 10/12/1991    Quit date: 10/11/1993    Years since quitting: 28.8    Passive exposure: Never   Smokeless tobacco: Never  Vaping Use   Vaping status: Never Used  Substance and Sexual Activity   Alcohol use: Not Currently   Drug use: No   Sexual activity: Not on file  Other Topics Concern   Not on file  Social History Narrative   Not on file   Social Determinants of Health   Financial Resource Strain: Not on file  Food Insecurity: Not on file  Transportation Needs: Not on file  Physical Activity: Not on file  Stress: Not on file  Social Connections: Not on file  Intimate Partner Violence: Not on file    Outpatient Encounter Medications as of 08/07/2022  Medication Sig   acetaminophen (TYLENOL) 500 MG tablet Take 500 mg by mouth every 6 (six) hours as needed.   Aspirin 81 MG CAPS Aspirin 81 mg   busPIRone (BUSPAR) 10 MG tablet Take 1 tablet (10 mg total) by mouth 2 (two) times daily.   calcium carbonate 1250 MG capsule Take 1,250 mg by mouth 2 (two) times daily with a meal.   Cyanocobalamin (B-12 PO) Take by mouth.   escitalopram (LEXAPRO) 10 MG tablet Take 1 tablet (10 mg total) by mouth daily.   etanercept (ENBREL SURECLICK) 50 MG/ML injection Inject 50 mg into the skin once a week.   folic acid (FOLVITE) 800 MCG tablet Take 400 mcg by mouth daily.   ibuprofen (ADVIL) 200 MG tablet Take 200 mg by mouth every 6 (six) hours as needed.   MAGNESIUM PO Take by mouth 3 times/day as needed-between meals & bedtime.   Multiple Vitamin (MULTIVITAMIN) tablet Take 1 tablet by mouth daily.     ondansetron (ZOFRAN) 4 MG tablet Take 1 tablet (4 mg total) by mouth every 8 (eight) hours as needed for nausea or vomiting.   predniSONE (DELTASONE) 5 MG tablet TAKE 1 TABLET BY MOUTH EVERY DAY WITH BREAKFAST   PROAIR HFA 108 (90 Base) MCG/ACT inhaler TAKE 2 PUFFS BY MOUTH EVERY 6 HOURS AS NEEDED FOR WHEEZE OR SHORTNESS OF BREATH   Probiotic Product (PROBIOTIC  DAILY PO) Take by mouth daily.   TURMERIC PO Take by mouth.   [DISCONTINUED] busPIRone (BUSPAR) 5 MG tablet Take 5 mg by mouth 2 (two) times daily.   omeprazole (PRILOSEC) 20 MG capsule Take 1 capsule (20 mg total) by mouth 2 (two) times daily before a meal. (Needs to be seen before next refill) (Patient taking differently: Take 20 mg by mouth as needed. (Needs to be seen before next refill))   [DISCONTINUED] hydrOXYzine (ATARAX/VISTARIL) 10 MG tablet Take 1 tablet (10 mg total) by mouth 3 (three) times daily as needed for anxiety. (Patient not taking: Reported on 08/07/2022)   No facility-administered encounter medications on file as of 08/07/2022.    Allergies  Allergen Reactions   Codeine Nausea And Vomiting    Review of Systems  Constitutional:  Positive for activity change, appetite change and fatigue. Negative for chills, diaphoresis, fever and unexpected weight change.  HENT: Negative.    Eyes: Negative.  Negative for photophobia and visual disturbance.  Respiratory:  Negative for cough, chest tightness and shortness of breath.   Cardiovascular:  Negative for chest pain, palpitations and leg swelling.  Gastrointestinal:  Negative for abdominal pain, blood in stool, constipation, diarrhea, nausea and vomiting.  Endocrine: Negative.  Negative for polydipsia, polyphagia and polyuria.  Genitourinary:  Negative for decreased urine volume, difficulty urinating, dysuria, frequency and urgency.  Musculoskeletal:  Negative for arthralgias and myalgias.  Skin: Negative.   Allergic/Immunologic: Negative.   Neurological:  Negative for dizziness, tremors, seizures, syncope, facial asymmetry, speech difficulty, weakness, light-headedness, numbness and headaches.  Hematological: Negative.   Psychiatric/Behavioral:  Positive for decreased concentration, dysphoric mood and sleep disturbance. Negative for behavioral problems, confusion, hallucinations, self-injury and suicidal ideas. The patient is  nervous/anxious. The patient is not hyperactive.   All other systems reviewed and are negative.       Objective:  BP 107/62   Pulse 73   Temp 98 F (36.7 C) (Temporal)   Ht 5\' 1"  (1.549 m)   Wt 107 lb 3.2 oz (48.6 kg)   SpO2 98%   BMI 20.26 kg/m    Wt Readings from Last 3 Encounters:  08/07/22 107 lb 3.2 oz (48.6 kg)  04/23/22 112 lb (50.8 kg)  04/10/22 113 lb 3.2 oz (51.3 kg)    Physical Exam Vitals and nursing note reviewed.  Constitutional:      General: She is not in acute distress.    Appearance: Normal appearance. She is well-developed and well-groomed. She is not ill-appearing, toxic-appearing or diaphoretic.  HENT:     Head: Normocephalic and atraumatic.     Jaw: There is normal jaw occlusion.     Right Ear: Hearing normal.     Left Ear: Hearing normal.     Nose: Nose normal.     Mouth/Throat:     Lips: Pink.     Mouth: Mucous membranes are moist.     Pharynx: Oropharynx is clear. Uvula midline.  Eyes:     General: Lids are normal.     Extraocular Movements: Extraocular movements intact.     Conjunctiva/sclera: Conjunctivae normal.     Pupils: Pupils are equal, round, and reactive to light.  Neck:     Trachea: Trachea and phonation normal.  Cardiovascular:     Rate and Rhythm: Normal rate and regular rhythm.     Chest Wall: PMI is not displaced.     Pulses: Normal pulses.     Heart sounds: Normal heart sounds. No murmur heard.  No friction rub. No gallop.  Pulmonary:     Effort: Pulmonary effort is normal. No respiratory distress.     Breath sounds: Normal breath sounds. No wheezing.  Musculoskeletal:        General: Normal range of motion.     Cervical back: Normal range of motion and neck supple.     Right lower leg: No edema.     Left lower leg: No edema.  Skin:    General: Skin is warm and dry.     Capillary Refill: Capillary refill takes less than 2 seconds.     Coloration: Skin is not cyanotic, jaundiced or pale.     Findings: No rash.   Neurological:     General: No focal deficit present.     Mental Status: She is alert and oriented to person, place, and time.     Sensory: Sensation is intact.     Motor: Motor function is intact.     Coordination: Coordination is intact.     Gait: Gait is intact.     Deep Tendon Reflexes: Reflexes are normal and symmetric.  Psychiatric:        Attention and Perception: Attention and perception normal.        Mood and Affect: Mood and affect normal.        Speech: Speech normal.        Behavior: Behavior normal. Behavior is cooperative.        Thought Content: Thought content normal.        Cognition and Memory: Cognition and memory normal.        Judgment: Judgment normal.     Results for orders placed or performed in visit on 04/23/22  Sedimentation rate  Result Value Ref Range   Sed Rate 9 0 - 30 mm/h  C-reactive protein  Result Value Ref Range   CRP <3.0 <8.0 mg/L  QuantiFERON-TB Gold Plus  Result Value Ref Range   QuantiFERON-TB Gold Plus NEGATIVE NEGATIVE   NIL 0.02 IU/mL   Mitogen-NIL >10.00 IU/mL   TB1-NIL 0.01 IU/mL   TB2-NIL 0.00 IU/mL       Pertinent labs & imaging results that were available during my care of the patient were reviewed by me and considered in my medical decision making.  Assessment & Plan:  Katie [name] "Winn Jock" was seen today for anxiety.  Diagnoses and all orders for this visit:  GAD (generalized anxiety disorder) Depression, recurrent (HCC) Will increase buspar dosing and add Lexapro. Pt aware to report new, worsening, or persistent symptoms. Follow up 4-6 weeks for reevaluation.  -     escitalopram (LEXAPRO) 10 MG tablet; Take 1 tablet (10 mg total) by mouth daily. -     busPIRone (BUSPAR) 10 MG tablet; Take 1 tablet (10 mg total) by mouth 2 (two) times daily.     Continue all other maintenance medications.  Follow up plan: Return in about 4 weeks (around 09/04/2022), or if symptoms worsen or fail to improve, for GAD.   Continue  healthy lifestyle choices, including diet (rich in fruits, vegetables, and lean proteins, and low in salt and simple carbohydrates) and exercise (at least 30 minutes of moderate physical activity daily).  Educational handout given for GAD  The above assessment and management plan was discussed with the patient. The patient verbalized understanding of and has agreed to the management plan. Patient is aware to call the clinic if they develop any new symptoms or if symptoms persist or worsen. Patient is aware  when to return to the clinic for a follow-up visit. Patient educated on when it is appropriate to go to the emergency department.   Kari Baars, FNP-C [facility] [phone]

## 2022-08-29 ENCOUNTER — Other Ambulatory Visit: Payer: Self-pay | Admitting: Family Medicine

## 2022-08-29 DIAGNOSIS — F339 Major depressive disorder, recurrent, unspecified: Secondary | ICD-10-CM

## 2022-08-29 DIAGNOSIS — F411 Generalized anxiety disorder: Secondary | ICD-10-CM

## 2022-09-04 ENCOUNTER — Encounter: Payer: Self-pay | Admitting: Family Medicine

## 2022-09-04 ENCOUNTER — Ambulatory Visit: Payer: 59 | Admitting: Family Medicine

## 2022-09-04 VITALS — BP 114/69 | HR 66 | Temp 96.1°F | Ht 61.0 in | Wt 108.2 lb

## 2022-09-04 DIAGNOSIS — F339 Major depressive disorder, recurrent, unspecified: Secondary | ICD-10-CM | POA: Diagnosis not present

## 2022-09-04 DIAGNOSIS — F411 Generalized anxiety disorder: Secondary | ICD-10-CM | POA: Diagnosis not present

## 2022-09-04 NOTE — Progress Notes (Signed)
Subjective:  Patient ID: Katie [name], female    DOB: [birth year], 62 y.o.   MRN: [id number]  Patient Care Team: Sonny Masters, FNP as PCP - General (Family Medicine)   Chief Complaint:  Anxiety (4 week follow up )   HPI: Katie [name] is a 62 y.o. female presenting on 09/04/2022 for Anxiety (4 week follow up )  1. GAD (generalized anxiety disorder) 2. Depression, recurrent (HCC)  At last visit pt was started on Lexapro and Buspar was increased. She has done very well with this regimen. States she still has some anxiety but nothing like it was. States she has been able to navigate her symptoms well. No adverse side effects.      09/04/2022    8:33 AM 08/07/2022    8:06 AM 04/10/2022   11:56 AM 01/21/2022   11:16 AM 06/30/2021   10:40 AM  Depression screen PHQ 2/9  Decreased Interest 0 2 0 2 1  Down, Depressed, Hopeless 1 2 0 1 0  PHQ - 2 Score 1 4 0 3 1  Altered sleeping 1 3 3 2  0  Tired, decreased energy 1 1 3 2 2   Change in appetite 0 1 1 0 0  Feeling bad or failure about yourself  0 0 0 0 0  Trouble concentrating 0 1 0 2 0  Moving slowly or fidgety/restless 0 2 1 0 0  Suicidal thoughts 0 0 0 0 0  PHQ-9 Score 3 12 8 9 3   Difficult doing work/chores Not difficult at all Very difficult Not difficult at all Somewhat difficult Not difficult at all      09/04/2022    8:33 AM 08/07/2022    8:06 AM 04/10/2022   11:56 AM 01/21/2022   11:16 AM  GAD 7 : Generalized Anxiety Score  Nervous, Anxious, on Edge 1 3 2 2   Control/stop worrying 1 3 3 3   Worry too much - different things 1 3 3 3   Trouble relaxing 1 3 1 2   Restless 0 3 0 2  Easily annoyed or irritable 0 2 2 2   Afraid - awful might happen 1 3 2 2   Total GAD 7 Score 5 20 13 16   Anxiety Difficulty Not difficult at all Very difficult Not difficult at all Somewhat difficult      Relevant past medical, surgical, family, and social history reviewed and updated as indicated.  Allergies and medications reviewed and  updated. Data reviewed: Chart in Epic.   Past Medical History:  Diagnosis Date   Arthritis    RA   Asthma    no attack since childhood   Blood transfusion    Contact lens/glasses fitting    wears glasses or contacts   Cystocele    Depression    External hemorrhoids    Fibromyalgia    GERD (gastroesophageal reflux disease)    past hx    Glaucoma    RIGHT EYE   HPV (human papilloma virus) infection    Neuromuscular disorder (HCC)    fibromyalgia   Syncope 2015   Pt questioned seizure with this episode - nothing like since - vaso vagal response    Wears dentures    top    Past Surgical History:  Procedure Laterality Date   ABDOMINAL EXPLORATION SURGERY  1984   bleed after hyst   APPENDECTOMY  1983   BLADDER SUSPENSION  2010   BREAST ENHANCEMENT SURGERY  2000   CARPAL TUNNEL RELEASE  1995   rt   CARPAL TUNNEL RELEASE Left 12/09/2012   Procedure: LEFT LIMITED OPEN CARPAL TUNNEL RELEASE;  Surgeon: Dominica Severin, MD;  Location: [facility];  Service: Orthopedics;  Laterality: Left;   COLONOSCOPY     CYSTOSCOPY     DIAGNOSTIC LAPAROSCOPY     Medial Branch Block  02/12/2022   Medial Branch Block  03/12/2022   MINI SHUNT INSERTION  10/14/2011   Procedure: INSERTION OF MINI SHUNT;  Surgeon: Chalmers Guest, MD;  Location: [facility] OR;  Service: Ophthalmology;  Laterality: Right;   POLYPECTOMY     Tubaligation  1980   VAGINAL HYSTERECTOMY  1983    Social History   Socioeconomic History   Marital status: Married    Spouse name: Not on file   Number of children: 1   Years of education: Not on file   Highest education level: Not on file  Occupational History    Employer: [employer]  Tobacco Use   Smoking status: Former    Current packs/day: 0.00    Average packs/day: 0.3 packs/day for 2.0 years (0.5 ttl pk-yrs)    Types: Cigarettes    Start date: 10/12/1991    Quit date: 10/11/1993    Years since quitting: 28.9    Passive exposure: Never    Smokeless tobacco: Never  Vaping Use   Vaping status: Never Used  Substance and Sexual Activity   Alcohol use: Not Currently   Drug use: No   Sexual activity: Not on file  Other Topics Concern   Not on file  Social History Narrative   Not on file   Social Determinants of Health   Financial Resource Strain: Not on file  Food Insecurity: Not on file  Transportation Needs: Not on file  Physical Activity: Not on file  Stress: Not on file  Social Connections: Not on file  Intimate Partner Violence: Not on file    Outpatient Encounter Medications as of 09/04/2022  Medication Sig   acetaminophen (TYLENOL) 500 MG tablet Take 500 mg by mouth every 6 (six) hours as needed.   Aspirin 81 MG CAPS Aspirin 81 mg   busPIRone (BUSPAR) 10 MG tablet Take 1 tablet (10 mg total) by mouth 2 (two) times daily.   calcium carbonate 1250 MG capsule Take 1,250 mg by mouth 2 (two) times daily with a meal.   Cyanocobalamin (B-12 PO) Take by mouth.   escitalopram (LEXAPRO) 10 MG tablet TAKE 1 TABLET BY MOUTH EVERY DAY   etanercept (ENBREL SURECLICK) 50 MG/ML injection Inject 50 mg into the skin once a week.   folic acid (FOLVITE) 800 MCG tablet Take 400 mcg by mouth daily.   ibuprofen (ADVIL) 200 MG tablet Take 200 mg by mouth every 6 (six) hours as needed.   MAGNESIUM PO Take by mouth 3 times/day as needed-between meals & bedtime.   Multiple Vitamin (MULTIVITAMIN) tablet Take 1 tablet by mouth daily.     ondansetron (ZOFRAN) 4 MG tablet Take 1 tablet (4 mg total) by mouth every 8 (eight) hours as needed for nausea or vomiting.   predniSONE (DELTASONE) 5 MG tablet TAKE 1 TABLET BY MOUTH EVERY DAY WITH BREAKFAST   PROAIR HFA 108 (90 Base) MCG/ACT inhaler TAKE 2 PUFFS BY MOUTH EVERY 6 HOURS AS NEEDED FOR WHEEZE OR SHORTNESS OF BREATH   Probiotic Product (PROBIOTIC DAILY PO) Take by mouth daily.   TURMERIC PO Take by mouth.   omeprazole (PRILOSEC) 20 MG capsule Take 1 capsule (20 mg  total) by mouth 2 (two)  times daily before a meal. (Needs to be seen before next refill) (Patient taking differently: Take 20 mg by mouth as needed. (Needs to be seen before next refill))   No facility-administered encounter medications on file as of 09/04/2022.    Allergies  Allergen Reactions   Codeine Nausea And Vomiting    Review of Systems  Constitutional:  Negative for activity change, appetite change, chills, diaphoresis, fatigue, fever and unexpected weight change.  HENT: Negative.    Eyes: Negative.  Negative for photophobia and visual disturbance.  Respiratory:  Negative for cough, chest tightness and shortness of breath.   Cardiovascular:  Negative for chest pain, palpitations and leg swelling.  Gastrointestinal:  Negative for abdominal pain, blood in stool, constipation, diarrhea, nausea and vomiting.  Endocrine: Negative.  Negative for polydipsia, polyphagia and polyuria.  Genitourinary:  Negative for decreased urine volume, difficulty urinating, dysuria, frequency and urgency.  Musculoskeletal:  Negative for arthralgias and myalgias.  Skin: Negative.   Allergic/Immunologic: Negative.   Neurological:  Negative for dizziness, tremors, seizures, syncope, facial asymmetry, speech difficulty, weakness, light-headedness, numbness and headaches.  Hematological: Negative.   Psychiatric/Behavioral:  Positive for agitation and sleep disturbance. Negative for behavioral problems, confusion, decreased concentration, dysphoric mood, hallucinations, self-injury and suicidal ideas. The patient is nervous/anxious. The patient is not hyperactive.   All other systems reviewed and are negative.       Objective:  BP 114/69   Pulse 66   Temp (!) 96.1 F (35.6 C) (Temporal)   Ht 5\' 1"  (1.549 m)   Wt 108 lb 3.2 oz (49.1 kg)   BMI 20.44 kg/m    Wt Readings from Last 3 Encounters:  09/04/22 108 lb 3.2 oz (49.1 kg)  08/07/22 107 lb 3.2 oz (48.6 kg)  04/23/22 112 lb (50.8 kg)    Physical Exam Vitals and  nursing note reviewed.  Constitutional:      General: She is not in acute distress.    Appearance: Normal appearance. She is not ill-appearing, toxic-appearing or diaphoretic.  HENT:     Head: Normocephalic and atraumatic.     Mouth/Throat:     Mouth: Mucous membranes are moist.  Eyes:     Pupils: Pupils are equal, round, and reactive to light.  Cardiovascular:     Rate and Rhythm: Normal rate and regular rhythm.     Heart sounds: Normal heart sounds.  Pulmonary:     Effort: Pulmonary effort is normal.     Breath sounds: Normal breath sounds.  Skin:    General: Skin is warm and dry.     Capillary Refill: Capillary refill takes less than 2 seconds.  Neurological:     General: No focal deficit present.     Mental Status: She is alert and oriented to person, place, and time.  Psychiatric:        Mood and Affect: Mood normal.        Behavior: Behavior normal.        Thought Content: Thought content normal.        Judgment: Judgment normal.     Results for orders placed or performed in visit on 04/23/22  Sedimentation rate  Result Value Ref Range   Sed Rate 9 0 - 30 mm/h  C-reactive protein  Result Value Ref Range   CRP <3.0 <8.0 mg/L  QuantiFERON-TB Gold Plus  Result Value Ref Range   QuantiFERON-TB Gold Plus NEGATIVE NEGATIVE   NIL 0.02 IU/mL   Mitogen-NIL >  10.00 IU/mL   TB1-NIL 0.01 IU/mL   TB2-NIL 0.00 IU/mL       Pertinent labs & imaging results that were available during my care of the patient were reviewed by me and considered in my medical decision making.  Assessment & Plan:  Katie "Winn Jock" was seen today for anxiety.  Diagnoses and all orders for this visit:  GAD (generalized anxiety disorder) Depression, recurrent (HCC) Doing great on current regimen without associated side effects. Will continue.    Continue all other maintenance medications.  Follow up plan: Return in about 3 months (around 12/05/2022) for GAD.   Continue healthy lifestyle  choices, including diet (rich in fruits, vegetables, and lean proteins, and low in salt and simple carbohydrates) and exercise (at least 30 minutes of moderate physical activity daily).  Educational handout given for GAD  The above assessment and management plan was discussed with the patient. The patient verbalized understanding of and has agreed to the management plan. Patient is aware to call the clinic if they develop any new symptoms or if symptoms persist or worsen. Patient is aware when to return to the clinic for a follow-up visit. Patient educated on when it is appropriate to go to the emergency department.   Kari Baars, FNP-C [facility] [phone]

## 2022-09-10 ENCOUNTER — Other Ambulatory Visit: Payer: Self-pay | Admitting: Internal Medicine

## 2022-09-10 DIAGNOSIS — Z79899 Other long term (current) drug therapy: Secondary | ICD-10-CM

## 2022-09-10 DIAGNOSIS — M069 Rheumatoid arthritis, unspecified: Secondary | ICD-10-CM

## 2022-09-10 NOTE — Telephone Encounter (Signed)
Last Fill: 06/30/2022  Labs: 04/10/2022  Glucose 65  TB Gold: 04/23/2022 negative   Next Visit: 10/12/2022  Last Visit: 04/23/2022  [id number] arthritis involving both hands, unspecified whether rheumatoid factor present   Current Dose per office note 04/23/2022: Enbrel 50 mg subcu weekly   Attempted to contact the patient and left a message advising labs are due.   Okay to refill Enbrel?

## 2022-10-05 ENCOUNTER — Other Ambulatory Visit: Payer: Self-pay | Admitting: Internal Medicine

## 2022-10-05 DIAGNOSIS — M069 Rheumatoid arthritis, unspecified: Secondary | ICD-10-CM

## 2022-10-05 DIAGNOSIS — Z79899 Other long term (current) drug therapy: Secondary | ICD-10-CM

## 2022-10-06 NOTE — Telephone Encounter (Signed)
Last Fill: 09/10/2022  Labs: 04/10/2022 Glucose 65  TB Gold: 04/23/2022 Negative   Next Visit: 10/12/2022  Last Visit: 04/23/2022  DX: Rheumatoid arthritis involving both hands, unspecified whether rheumatoid factor present   Current Dose per office note 04/23/2022: Enbrel 50 mg subcu weekly.   Patient was called last refill request about labs. Patient to update labs at upcomming appointment.   Okay to refill Enbrel?

## 2022-10-12 ENCOUNTER — Encounter: Payer: Self-pay | Admitting: Internal Medicine

## 2022-10-12 ENCOUNTER — Ambulatory Visit: Payer: 59 | Attending: Internal Medicine | Admitting: Internal Medicine

## 2022-10-12 VITALS — BP 94/56 | HR 64 | Resp 14 | Ht 60.0 in | Wt 109.0 lb

## 2022-10-12 DIAGNOSIS — Z79899 Other long term (current) drug therapy: Secondary | ICD-10-CM | POA: Diagnosis not present

## 2022-10-12 DIAGNOSIS — M069 Rheumatoid arthritis, unspecified: Secondary | ICD-10-CM

## 2022-10-12 DIAGNOSIS — Z7952 Long term (current) use of systemic steroids: Secondary | ICD-10-CM | POA: Diagnosis not present

## 2022-10-13 LAB — CBC WITH DIFFERENTIAL/PLATELET
Absolute Monocytes: 250 {cells}/uL (ref 200–950)
Basophils Absolute: 19 {cells}/uL (ref 0–200)
Basophils Relative: 0.4 %
Eosinophils Absolute: 29 {cells}/uL (ref 15–500)
Eosinophils Relative: 0.6 %
HCT: 40.3 % (ref 35.0–45.0)
Hemoglobin: 13.2 g/dL (ref 11.7–15.5)
Lymphs Abs: 1099 {cells}/uL (ref 850–3900)
MCH: 30.8 pg (ref 27.0–33.0)
MCHC: 32.8 g/dL (ref 32.0–36.0)
MCV: 93.9 fL (ref 80.0–100.0)
MPV: 11.5 fL (ref 7.5–12.5)
Monocytes Relative: 5.2 %
Neutro Abs: 3403 {cells}/uL (ref 1500–7800)
Neutrophils Relative %: 70.9 %
Platelets: 165 10*3/uL (ref 140–400)
RBC: 4.29 10*6/uL (ref 3.80–5.10)
RDW: 13.3 % (ref 11.0–15.0)
Total Lymphocyte: 22.9 %
WBC: 4.8 10*3/uL (ref 3.8–10.8)

## 2022-10-13 LAB — COMPLETE METABOLIC PANEL WITH GFR
AG Ratio: 1.9 (calc) (ref 1.0–2.5)
ALT: 11 U/L (ref 6–29)
AST: 17 U/L (ref 10–35)
Albumin: 4.3 g/dL (ref 3.6–5.1)
Alkaline phosphatase (APISO): 58 U/L (ref 37–153)
BUN: 9 mg/dL (ref 7–25)
CO2: 28 mmol/L (ref 20–32)
Calcium: 9.9 mg/dL (ref 8.6–10.4)
Chloride: 106 mmol/L (ref 98–110)
Creat: 0.71 mg/dL (ref 0.50–1.05)
Globulin: 2.3 g/dL (ref 1.9–3.7)
Glucose, Bld: 89 mg/dL (ref 65–99)
Potassium: 4.4 mmol/L (ref 3.5–5.3)
Sodium: 143 mmol/L (ref 135–146)
Total Bilirubin: 0.5 mg/dL (ref 0.2–1.2)
Total Protein: 6.6 g/dL (ref 6.1–8.1)
eGFR: 95 mL/min/{1.73_m2} (ref >=60)

## 2022-10-13 LAB — SEDIMENTATION RATE: Sed Rate: 14 mm/h (ref 0–30)

## 2022-10-16 ENCOUNTER — Encounter: Payer: Self-pay | Admitting: Family Medicine

## 2022-10-19 ENCOUNTER — Encounter (HOSPITAL_COMMUNITY): Payer: Self-pay | Admitting: Occupational Therapy

## 2022-10-19 ENCOUNTER — Ambulatory Visit (HOSPITAL_COMMUNITY): Payer: 59 | Attending: Internal Medicine | Admitting: Occupational Therapy

## 2022-10-19 DIAGNOSIS — M79641 Pain in right hand: Secondary | ICD-10-CM | POA: Insufficient documentation

## 2022-10-19 DIAGNOSIS — M069 Rheumatoid arthritis, unspecified: Secondary | ICD-10-CM | POA: Diagnosis not present

## 2022-10-19 DIAGNOSIS — M79642 Pain in left hand: Secondary | ICD-10-CM | POA: Insufficient documentation

## 2022-10-19 DIAGNOSIS — R29898 Other symptoms and signs involving the musculoskeletal system: Secondary | ICD-10-CM | POA: Insufficient documentation

## 2022-10-19 NOTE — Therapy (Unsigned)
OUTPATIENT OCCUPATIONAL THERAPY ORTHO EVALUATION  Patient Name: Katie [name] MRN: [id number] DOB:[birth year], 62 y.o., female Today's Date: 10/20/2022  PCP: Gilford Silvius, FNP REFERRING PROVIDER: Fuller Plan, MD  END OF SESSION:  OT End of Session - 10/20/22 0751     Visit Number 1    Number of Visits 3    Date for OT Re-Evaluation 11/27/22    Authorization Type [employer], $50 copay    OT Start Time 1521    OT Stop Time 1607    OT Time Calculation (min) 46 min    Activity Tolerance Patient tolerated treatment well    Behavior During Therapy WFL for tasks assessed/performed             Past Medical History:  Diagnosis Date   Arthritis    RA   Asthma    no attack since childhood   Blood transfusion    Contact lens/glasses fitting    wears glasses or contacts   Cystocele    Depression    External hemorrhoids    Fibromyalgia    GERD (gastroesophageal reflux disease)    past hx    Glaucoma    RIGHT EYE   HPV (human papilloma virus) infection    Neuromuscular disorder (HCC)    fibromyalgia   Syncope 2015   Pt questioned seizure with this episode - nothing like since - vaso vagal response    Wears dentures    top   Past Surgical History:  Procedure Laterality Date   ABDOMINAL EXPLORATION SURGERY  1984   bleed after hyst   APPENDECTOMY  1983   BLADDER SUSPENSION  2010   BREAST ENHANCEMENT SURGERY  2000   CARPAL TUNNEL RELEASE  1995   rt   CARPAL TUNNEL RELEASE Left 12/09/2012   Procedure: LEFT LIMITED OPEN CARPAL TUNNEL RELEASE;  Surgeon: Dominica Severin, MD;  Location: [facility];  Service: Orthopedics;  Laterality: Left;   COLONOSCOPY     CYSTOSCOPY     DIAGNOSTIC LAPAROSCOPY     Medial Branch Block  02/12/2022   Medial Branch Block  03/12/2022   MINI SHUNT INSERTION  10/14/2011   Procedure: INSERTION OF MINI SHUNT;  Surgeon: Chalmers Guest, MD;  Location: [facility] OR;  Service: Ophthalmology;  Laterality: Right;   POLYPECTOMY      Tubaligation  1980   VAGINAL HYSTERECTOMY  1983   Patient Active Problem List   Diagnosis Date Noted   Blindness of right eye with normal vision in contralateral eye 03/19/2021   High risk medication use 02/26/2021   Decreased libido 06/02/2018   Depression, recurrent (HCC) 05/27/2018   GAD (generalized anxiety disorder) 05/27/2018   Gastroesophageal reflux disease without esophagitis 05/27/2018   RA (rheumatoid arthritis) (HCC) 05/27/2016    ONSET DATE: ~6 months  REFERRING DIAG: Rheumatiod Arthritis   THERAPY DIAG:  Pain in left hand  Pain in right hand  Other symptoms and signs involving the musculoskeletal system  Rationale for Evaluation and Treatment: Rehabilitation  SUBJECTIVE:   SUBJECTIVE STATEMENT: "I slammed my finger in the car door last week." Pt accompanied by: self  PERTINENT HISTORY: PMH significant for RA, Asthma, Fibromyalgia, Depression. Pt continuing to have increasing pain and RA changes in her finger joints despite multiple medications.   PRECAUTIONS: None  WEIGHT BEARING RESTRICTIONS: No  PAIN:  Are you having pain? Yes: NPRS scale: 5/10 Pain location: R hand worse than L hand Pain description: aching Aggravating factors: nothing Relieving factors: sometimes medication  FALLS: Has patient fallen in last 6 months? No  LIVING ENVIRONMENT: Lives with: lives with their spouse Lives in: House/apartment  PLOF: Independent  PATIENT GOALS: To make my hand where I can use it better  NEXT MD VISIT: 01/12/22  OBJECTIVE:  Note: Objective measures were completed at Evaluation unless otherwise noted.  HAND DOMINANCE: Right  ADLs: Overall ADLs: Pt is unable to open containers and bottles without compensatory strategies. Pt also reports difficulty with manipulating small items such as buttons, zippers, and clasps.   UPPER EXTREMITY ROM:     Active ROM Right eval Left eval  Wrist flexion 80 82  Wrist extension 79 78  Wrist ulnar deviation  48 47  Wrist radial deviation 28 44  Wrist pronation [facility] WFL  Wrist supination WFL WFL  (Blank rows = not tested)  Active ROM Right eval Left eval  Thumb MCP (0-60) 80 75  Thumb IP (0-80) 85 80  Thumb Opposition to Small Finger Able  Able  Index MCP (0-90) 70  90  Index PIP (0-100) 95  85  Index DIP (0-70) 60   80  Long MCP (0-90) 75   90  Long PIP (0-100)  95  85  Long DIP (0-70)  65  80  Ring MCP (0-90) 80   75  Ring PIP (0-100)  100  95  Ring DIP (0-70)  60 70   Little MCP (0-90) 80   90  Little PIP (0-100)  100  100  Little DIP (0-70)  75  80  (Blank rows = not tested)   UPPER EXTREMITY MMT:     MMT Right eval Left eval  Wrist flexion 5/5 5/5  Wrist extension 5/5 5/5  Wrist ulnar deviation 5/5 5/5  Wrist radial deviation 5/5 5/5  Wrist pronation 4+/5 4+/5  Wrist supination 4+/5 4+/5  (Blank rows = not tested)  HAND FUNCTION: Grip strength: Right: 46 lbs; Left: 47 lbs, Lateral pinch: Right: 5 lbs, Left: 6 lbs, and 3 point pinch: Right: 5 lbs, Left: 4 lbs  COORDINATION: 9 Hole Peg test: Right: 23.15 sec; Left: 21.80 sec  SENSATION: WFL  EDEMA: No swelling noted  OBSERVATIONS: Rheumatic changes in all MCP knuckles on Both hands   TODAY'S TREATMENT:                                                                                                                              DATE: 10/19/22: Evaluation Only   PATIENT EDUCATION: Education details: Will start first session Person educated: Patient Education method: Explanation Education comprehension: verbalized understanding  HOME EXERCISE PROGRAM: 10/14: Will start first session  GOALS: Goals reviewed with patient? Yes  SHORT TERM GOALS: Target date: 11/27/22  Pt will be provided a comprehensive HEP for RUE and LUE mobility to improve ADL Completions.   Goal status: INITIAL  2.  Pt will decrease pain to 4/10 or less in order to complete BADL's without stopping to take  a break due to severity of  pain.   Goal status: INITIAL  3.  Pt will improve BUE wrist strength to 5/5 in all motions, in order to lift and carry moderately heavy items during cooking and cleaning.   Goal status: INITIAL  4.  Pt will improve BUE grip strength by 8# and Pinch Strength by 3# in order to manipulate small objects by pinching and gripping on to them.   Goal status: INITIAL   ASSESSMENT:  CLINICAL IMPRESSION: Patient is a 62 y.o. female who was seen today for occupational therapy evaluation for Rheumatoid arthritis in BUE. Pt presents with weakness in the wrist, grip, and pinch, poor activity tolerance, and severe pain in all fingers, limiting ADL's and IADL's.    PERFORMANCE DEFICITS: in functional skills including ADLs, IADLs, dexterity, ROM, strength, pain, fascial restrictions, Fine motor control, body mechanics, and UE functional use.  IMPAIRMENTS: are limiting patient from ADLs, IADLs, rest and sleep, work, leisure, and social participation.   COMORBIDITIES: has no other co-morbidities that affects occupational performance. Patient will benefit from skilled OT to address above impairments and improve overall function.  MODIFICATION OR ASSISTANCE TO COMPLETE EVALUATION: No modification of tasks or assist necessary to complete an evaluation.  OT OCCUPATIONAL PROFILE AND HISTORY: Problem focused assessment: Including review of records relating to presenting problem.  CLINICAL DECISION MAKING: LOW - limited treatment options, no task modification necessary  REHAB POTENTIAL: Good  EVALUATION COMPLEXITY: Low      PLAN:  OT FREQUENCY: every other week  OT DURATION: 4 weeks  PLANNED INTERVENTIONS: 97535 self care/ADL training, 09811 therapeutic exercise, 97530 therapeutic activity, 97140 manual therapy, 97035 ultrasound, 97018 paraffin, 91478 moist heat, 97032 electrical stimulation (manual), passive range of motion, functional mobility training, energy conservation, coping strategies  training, patient/family education, and DME and/or AE instructions  RECOMMENDED OTHER SERVICES: N/A  CONSULTED AND AGREED WITH PLAN OF CARE: Patient  PLAN FOR NEXT SESSION: Trial Paraffin, A/ROM, strengthening, grip and pinch tasks.    Trish Mage, OTR/L [facility] [phone] Kennyth Arnold, OT 10/20/2022, 7:53 AM

## 2022-10-21 ENCOUNTER — Other Ambulatory Visit: Payer: Self-pay | Admitting: Internal Medicine

## 2022-10-21 DIAGNOSIS — Z79899 Other long term (current) drug therapy: Secondary | ICD-10-CM

## 2022-10-21 DIAGNOSIS — M069 Rheumatoid arthritis, unspecified: Secondary | ICD-10-CM

## 2022-10-21 NOTE — Progress Notes (Signed)
Sedimentation rate is normal at 14.  Blood count and kidney and liver function are all normal.  No problem for continuing the Enbrel and low-dose prednisone.

## 2022-10-21 NOTE — Telephone Encounter (Signed)
Last Fill: 10/06/2022  Labs: 10/12/2022 WNL  TB Gold: 04/23/2022 Neg    Next Visit: 01/13/2023  Last Visit: 10/12/2022  DX: Rheumatoid arthritis involving both hands, unspecified whether rheumatoid factor present   Current Dose per office note 10/12/2022: Enbrel 50 mg subcu weekly   Okay to refill Enbrel?

## 2022-10-30 ENCOUNTER — Ambulatory Visit (HOSPITAL_COMMUNITY): Payer: 59 | Admitting: Occupational Therapy

## 2022-10-30 ENCOUNTER — Encounter (HOSPITAL_COMMUNITY): Payer: Self-pay | Admitting: Occupational Therapy

## 2022-10-30 DIAGNOSIS — M79642 Pain in left hand: Secondary | ICD-10-CM

## 2022-10-30 DIAGNOSIS — M79641 Pain in right hand: Secondary | ICD-10-CM | POA: Diagnosis not present

## 2022-10-30 DIAGNOSIS — R29898 Other symptoms and signs involving the musculoskeletal system: Secondary | ICD-10-CM

## 2022-10-30 DIAGNOSIS — M069 Rheumatoid arthritis, unspecified: Secondary | ICD-10-CM | POA: Diagnosis not present

## 2022-10-30 NOTE — Patient Instructions (Signed)
Complete each exercise 10-15X, 2-3X/day  1) Towel crunch Place a small towel on a firm table top. Flatten out the towel and then place your hand on one end of it.  Next, flex your fingers 2-5 (index finger through pinky finger) as you pull the towel towards your hand.    2) Digit composite flexion/adduction (make a fist) Hold your hand up as shown. Open and close your hand into a fist and repeat. If you cannot make a full fist, then make a partial fist.    3) Thumb/finger opposition Touch the tip of the thumb to each fingertip one by one. Extend fingers fully after they are touched.      4) Finger Taps Start with the hand flat and fingers slightly spread.  One at a time, starting with the thumb, lift each finger up separately.         7) Digit Abduction/Adduction Hold hand palm down flat on table. Spread your fingers apart and back together.

## 2022-10-30 NOTE — Therapy (Signed)
OUTPATIENT OCCUPATIONAL THERAPY ORTHO TREATMENT  Patient Name: Katie [name] MRN: [id number] DOB:Feb 06, 1960, 62 y.o., female Today's Date: 10/30/2022  PCP: Gilford Silvius, FNP REFERRING PROVIDER: Fuller Plan, MD  END OF SESSION:  OT End of Session - 10/30/22 1510     Visit Number 2    Number of Visits 3    Date for OT Re-Evaluation 11/27/22    Authorization Type [employer], $50 copay    OT Start Time 1430    OT Stop Time 1514    OT Time Calculation (min) 44 min    Activity Tolerance Patient tolerated treatment well    Behavior During Therapy WFL for tasks assessed/performed              Past Medical History:  Diagnosis Date   Arthritis    RA   Asthma    no attack since childhood   Blood transfusion    Contact lens/glasses fitting    wears glasses or contacts   Cystocele    Depression    External hemorrhoids    Fibromyalgia    GERD (gastroesophageal reflux disease)    past hx    Glaucoma    RIGHT EYE   HPV (human papilloma virus) infection    Neuromuscular disorder (HCC)    fibromyalgia   Syncope 2015   Pt questioned seizure with this episode - nothing like since - vaso vagal response    Wears dentures    top   Past Surgical History:  Procedure Laterality Date   ABDOMINAL EXPLORATION SURGERY  1984   bleed after hyst   APPENDECTOMY  1983   BLADDER SUSPENSION  2010   BREAST ENHANCEMENT SURGERY  2000   CARPAL TUNNEL RELEASE  1995   rt   CARPAL TUNNEL RELEASE Left 12/09/2012   Procedure: LEFT LIMITED OPEN CARPAL TUNNEL RELEASE;  Surgeon: Dominica Severin, MD;  Location: [facility];  Service: Orthopedics;  Laterality: Left;   COLONOSCOPY     CYSTOSCOPY     DIAGNOSTIC LAPAROSCOPY     Medial Branch Block  02/12/2022   Medial Branch Block  03/12/2022   MINI SHUNT INSERTION  10/14/2011   Procedure: INSERTION OF MINI SHUNT;  Surgeon: Chalmers Guest, MD;  Location: [facility] OR;  Service: Ophthalmology;  Laterality: Right;   POLYPECTOMY      Tubaligation  1980   VAGINAL HYSTERECTOMY  1983   Patient Active Problem List   Diagnosis Date Noted   Blindness of right eye with normal vision in contralateral eye 03/19/2021   High risk medication use 02/26/2021   Decreased libido 06/02/2018   Depression, recurrent (HCC) 05/27/2018   GAD (generalized anxiety disorder) 05/27/2018   Gastroesophageal reflux disease without esophagitis 05/27/2018   RA (rheumatoid arthritis) (HCC) 05/27/2016    ONSET DATE: ~6 months  REFERRING DIAG: Rheumatiod Arthritis   THERAPY DIAG:  Pain in left hand  Pain in right hand  Other symptoms and signs involving the musculoskeletal system  Rationale for Evaluation and Treatment: Rehabilitation  SUBJECTIVE:   SUBJECTIVE STATEMENT: "I got myself a paraffin wax machine." Pt accompanied by: self  PERTINENT HISTORY: PMH significant for RA, Asthma, Fibromyalgia, Depression. Pt continuing to have increasing pain and RA changes in her finger joints despite multiple medications.   PRECAUTIONS: None  WEIGHT BEARING RESTRICTIONS: No  PAIN:  Are you having pain? Yes: NPRS scale: 5/10 Pain location: R hand worse than L hand Pain description: aching Aggravating factors: nothing Relieving factors: sometimes medication  FALLS: Has  patient fallen in last 6 months? No  LIVING ENVIRONMENT: Lives with: lives with their spouse Lives in: House/apartment  PLOF: Independent  PATIENT GOALS: To make my hand where I can use it better  NEXT MD VISIT: 01/12/22  OBJECTIVE:  Note: Objective measures were completed at Evaluation unless otherwise noted.  HAND DOMINANCE: Right  ADLs: Overall ADLs: Pt is unable to open containers and bottles without compensatory strategies. Pt also reports difficulty with manipulating small items such as buttons, zippers, and clasps.   UPPER EXTREMITY ROM:     Active ROM Right eval Left eval  Wrist flexion 80 82  Wrist extension 79 78  Wrist ulnar deviation 48 47   Wrist radial deviation 28 44  Wrist pronation [facility] WFL  Wrist supination WFL WFL  (Blank rows = not tested)  Active ROM Right eval Left eval  Thumb MCP (0-60) 80 75  Thumb IP (0-80) 85 80  Thumb Opposition to Small Finger Able  Able  Index MCP (0-90) 70  90  Index PIP (0-100) 95  85  Index DIP (0-70) 60   80  Long MCP (0-90) 75   90  Long PIP (0-100)  95  85  Long DIP (0-70)  65  80  Ring MCP (0-90) 80   75  Ring PIP (0-100)  100  95  Ring DIP (0-70)  60 70   Little MCP (0-90) 80   90  Little PIP (0-100)  100  100  Little DIP (0-70)  75  80  (Blank rows = not tested)   UPPER EXTREMITY MMT:     MMT Right eval Left eval  Wrist flexion 5/5 5/5  Wrist extension 5/5 5/5  Wrist ulnar deviation 5/5 5/5  Wrist radial deviation 5/5 5/5  Wrist pronation 4+/5 4+/5  Wrist supination 4+/5 4+/5  (Blank rows = not tested)  HAND FUNCTION: Grip strength: Right: 46 lbs; Left: 47 lbs, Lateral pinch: Right: 5 lbs, Left: 6 lbs, and 3 point pinch: Right: 5 lbs, Left: 4 lbs  COORDINATION: 9 Hole Peg test: Right: 23.15 sec; Left: 21.80 sec  SENSATION: WFL  EDEMA: No swelling noted  OBSERVATIONS: Rheumatic changes in all MCP knuckles on Both hands   TODAY'S TREATMENT:                                                                                                                              DATE:   10/30/22  -Paraffin: 12 minutes with heat on top, discussed home strategies  -A/ROM: finger taps, digit abduction  -Wrist strengthenign: 1 pound dumb bell; flexion, extension, ulnar/radial deviation 10x each  - Grip strength: flatten out red putty, cuttign cookies with PVC pipe with L/R hand 15x each   10/19/22: Evaluation Only   PATIENT EDUCATION: Education details: Will start first session Person educated: Patient Education method: Explanation Education comprehension: verbalized understanding  HOME EXERCISE PROGRAM: 10/14: Will start first session 10/25: digit ROM  GOALS: Goals reviewed with patient? Yes  SHORT TERM GOALS: Target date: 11/27/22  Pt will be provided a comprehensive HEP for RUE and LUE mobility to improve ADL Completions.   Goal status: INITIAL  2.  Pt will decrease pain to 4/10 or less in order to complete BADL's without stopping to take a break due to severity of pain.   Goal status: INITIAL  3.  Pt will improve BUE wrist strength to 5/5 in all motions, in order to lift and carry moderately heavy items during cooking and cleaning.   Goal status: INITIAL  4.  Pt will improve BUE grip strength by 8# and Pinch Strength by 3# in order to manipulate small objects by pinching and gripping on to them.   Goal status: INITIAL   ASSESSMENT:  CLINICAL IMPRESSION: Pt reports she is continuing to experience pain in BL hands. She reports that she that she has bought an at home paraffin machine. She stated that has been using it and it has been beneficial with decreasing her pain. Began session with paraffin and heat. Initiated ROM this session and gave handouts for home.   PERFORMANCE DEFICITS: in functional skills including ADLs, IADLs, dexterity, ROM, strength, pain, fascial restrictions, Fine motor control, body mechanics, and UE functional use.  IMPAIRMENTS: are limiting patient from ADLs, IADLs, rest and sleep, work, leisure, and social participation.   COMORBIDITIES: has no other co-morbidities that affects occupational performance. Patient will benefit from skilled OT to address above impairments and improve overall function.  MODIFICATION OR ASSISTANCE TO COMPLETE EVALUATION: No modification of tasks or assist necessary to complete an evaluation.  OT OCCUPATIONAL PROFILE AND HISTORY: Problem focused assessment: Including review of records relating to presenting problem.  CLINICAL DECISION MAKING: LOW - limited treatment options, no task modification necessary  REHAB POTENTIAL: Good  EVALUATION COMPLEXITY:  Low      PLAN:  OT FREQUENCY: every other week  OT DURATION: 4 weeks  PLANNED INTERVENTIONS: 97535 self care/ADL training, 16109 therapeutic exercise, 97530 therapeutic activity, 97140 manual therapy, 97035 ultrasound, 97018 paraffin, 60454 moist heat, 97032 electrical stimulation (manual), passive range of motion, functional mobility training, energy conservation, coping strategies training, patient/family education, and DME and/or AE instructions  RECOMMENDED OTHER SERVICES: N/A  CONSULTED AND AGREED WITH PLAN OF CARE: Patient  PLAN FOR NEXT SESSION: Trial Paraffin, A/ROM, strengthening, grip and pinch tasks.    [facility] [phone] Bevelyn Ngo, OTR/L 10/30/2022, 3:12 PM

## 2022-11-12 ENCOUNTER — Other Ambulatory Visit: Payer: Self-pay | Admitting: Internal Medicine

## 2022-11-12 DIAGNOSIS — M069 Rheumatoid arthritis, unspecified: Secondary | ICD-10-CM

## 2022-11-12 NOTE — Telephone Encounter (Signed)
Last Fill: 08/07/2022  Next Visit: 01/13/2023  Last Visit: 10/12/2022  Dx: Rheumatoid arthritis involving both hands, unspecified whether rheumatoid factor present   Current Dose per office note on 10/12/2022: prednisone 5 mg daily   Okay to refill Prednisone?

## 2022-11-20 ENCOUNTER — Encounter (HOSPITAL_COMMUNITY): Payer: 59 | Admitting: Occupational Therapy

## 2022-11-24 ENCOUNTER — Encounter: Payer: Self-pay | Admitting: Family Medicine

## 2022-11-24 ENCOUNTER — Ambulatory Visit: Payer: 59 | Admitting: Family Medicine

## 2022-11-24 VITALS — BP 92/54 | HR 70 | Temp 97.9°F | Ht 61.0 in | Wt 109.2 lb

## 2022-11-24 DIAGNOSIS — S30811A Abrasion of abdominal wall, initial encounter: Secondary | ICD-10-CM

## 2022-11-24 DIAGNOSIS — F339 Major depressive disorder, recurrent, unspecified: Secondary | ICD-10-CM

## 2022-11-24 DIAGNOSIS — F411 Generalized anxiety disorder: Secondary | ICD-10-CM | POA: Diagnosis not present

## 2022-11-24 MED ORDER — ESCITALOPRAM OXALATE 10 MG PO TABS: 10.0000 mg | ORAL_TABLET | Freq: Every day | ORAL | 2 refills | Status: DC

## 2022-11-24 NOTE — Progress Notes (Signed)
Subjective:  Patient ID: Katie [name], female    DOB: [birth year], 62 y.o.   MRN: [id number]  Patient Care Team: Sonny Masters, FNP as PCP - General (Family Medicine)   Chief Complaint:  place on side (Pain and burning on left flank )   HPI: Katie [name] is a 62 y.o. female presenting on 11/24/2022 for place on side (Pain and burning on left flank )   Discussed the use of AI scribe software for clinical note transcription with the patient, who gave verbal consent to proceed.  History of Present Illness   The patient presents with a non-healing skin lesion on the abdomen. The lesion first appeared in July, initially as a small scab, which was inadvertently scrubbed off. Despite attempts at self-care, including the application of antibiotics and wound care cleaner, the lesion has persisted and occasionally bleeds. The patient reports that the lesion is not painful but describes a burning sensation. The lesion's location on the abdomen is problematic as it is frequently irritated by clothing and daily activities.  In addition to the skin lesion, the patient also mentions a need for lexapro refill. The patient reports significant improvement in her overall well-being since starting the lexapro. The patient also alludes to a history of anxiety or mood disorder, which has improved with medication.     11/24/2022    2:57 PM 09/04/2022    8:33 AM 08/07/2022    8:06 AM 04/10/2022   11:56 AM 01/21/2022   11:16 AM  Depression screen PHQ 2/9  Decreased Interest 0 0 2 0 2  Down, Depressed, Hopeless 0 1 2 0 1  PHQ - 2 Score 0 1 4 0 3  Altered sleeping 0 1 3 3 2   Tired, decreased energy 1 1 1 3 2   Change in appetite 0 0 1 1 0  Feeling bad or failure about yourself  0 0 0 0 0  Trouble concentrating 0 0 1 0 2  Moving slowly or fidgety/restless 0 0 2 1 0  Suicidal thoughts 0 0 0 0 0  PHQ-9 Score 1 3 12 8 9   Difficult doing work/chores Not difficult at all Not difficult at all Very  difficult Not difficult at all Somewhat difficult      11/24/2022    2:57 PM 09/04/2022    8:33 AM 08/07/2022    8:06 AM 04/10/2022   11:56 AM  GAD 7 : Generalized Anxiety Score  Nervous, Anxious, on Edge 1 1 3 2   Control/stop worrying 1 1 3 3   Worry too much - different things 1 1 3 3   Trouble relaxing 0 1 3 1   Restless 0 0 3 0  Easily annoyed or irritable 1 0 2 2  Afraid - awful might happen 1 1 3 2   Total GAD 7 Score 5 5 20 13   Anxiety Difficulty Not difficult at all Not difficult at all Very difficult Not difficult at all        Relevant past medical, surgical, family, and social history reviewed and updated as indicated.  Allergies and medications reviewed and updated. Data reviewed: Chart in Epic.   Past Medical History:  Diagnosis Date   Arthritis    RA   Asthma    no attack since childhood   Blood transfusion    Contact lens/glasses fitting    wears glasses or contacts   Cystocele    Depression    External hemorrhoids    Fibromyalgia  GERD (gastroesophageal reflux disease)    past hx    Glaucoma    RIGHT EYE   HPV (human papilloma virus) infection    Neuromuscular disorder (HCC)    fibromyalgia   Syncope 2015   Pt questioned seizure with this episode - nothing like since - vaso vagal response    Wears dentures    top    Past Surgical History:  Procedure Laterality Date   ABDOMINAL EXPLORATION SURGERY  1984   bleed after hyst   APPENDECTOMY  1983   BLADDER SUSPENSION  2010   BREAST ENHANCEMENT SURGERY  2000   CARPAL TUNNEL RELEASE  1995   rt   CARPAL TUNNEL RELEASE Left 12/09/2012   Procedure: LEFT LIMITED OPEN CARPAL TUNNEL RELEASE;  Surgeon: Dominica Severin, MD;  Location: [facility];  Service: Orthopedics;  Laterality: Left;   COLONOSCOPY     CYSTOSCOPY     DIAGNOSTIC LAPAROSCOPY     Medial Branch Block  02/12/2022   Medial Branch Block  03/12/2022   MINI SHUNT INSERTION  10/14/2011   Procedure: INSERTION OF MINI SHUNT;   Surgeon: Chalmers Guest, MD;  Location: [facility] OR;  Service: Ophthalmology;  Laterality: Right;   POLYPECTOMY     Tubaligation  1980   VAGINAL HYSTERECTOMY  1983    Social History   Socioeconomic History   Marital status: Married    Spouse name: Not on file   Number of children: 1   Years of education: Not on file   Highest education level: Not on file  Occupational History    Employer: [employer]  Tobacco Use   Smoking status: Former    Current packs/day: 0.00    Average packs/day: 0.3 packs/day for 2.0 years (0.5 ttl pk-yrs)    Types: Cigarettes    Start date: 10/12/1991    Quit date: 10/11/1993    Years since quitting: 29.1    Passive exposure: Never   Smokeless tobacco: Never  Vaping Use   Vaping status: Never Used  Substance and Sexual Activity   Alcohol use: Not Currently   Drug use: No   Sexual activity: Not on file  Other Topics Concern   Not on file  Social History Narrative   Not on file   Social Determinants of Health   Financial Resource Strain: Not on file  Food Insecurity: Not on file  Transportation Needs: Not on file  Physical Activity: Not on file  Stress: Not on file  Social Connections: Not on file  Intimate Partner Violence: Not on file    Outpatient Encounter Medications as of 11/24/2022  Medication Sig   acetaminophen (TYLENOL) 500 MG tablet Take 500 mg by mouth every 6 (six) hours as needed.   Aspirin 81 MG CAPS Aspirin 81 mg   busPIRone (BUSPAR) 10 MG tablet Take 1 tablet (10 mg total) by mouth 2 (two) times daily.   calcium carbonate 1250 MG capsule Take 1,250 mg by mouth 2 (two) times daily with a meal.   Cyanocobalamin (B-12 PO) Take by mouth.   ENBREL SURECLICK 50 MG/ML injection INJECT 1 PEN UNDER THE SKIN EVERY 7 DAYS   folic acid (FOLVITE) 800 MCG tablet Take 400 mcg by mouth daily.   ibuprofen (ADVIL) 200 MG tablet Take 200 mg by mouth every 6 (six) hours as needed.   MAGNESIUM PO Take by mouth 3 times/day as needed-between  meals & bedtime.   Multiple Vitamin (MULTIVITAMIN) tablet Take 1 tablet by mouth daily.  ondansetron (ZOFRAN) 4 MG tablet Take 1 tablet (4 mg total) by mouth every 8 (eight) hours as needed for nausea or vomiting.   predniSONE (DELTASONE) 5 MG tablet TAKE 1 TABLET BY MOUTH EVERY DAY WITH BREAKFAST   PROAIR HFA 108 (90 Base) MCG/ACT inhaler TAKE 2 PUFFS BY MOUTH EVERY 6 HOURS AS NEEDED FOR WHEEZE OR SHORTNESS OF BREATH   Probiotic Product (PROBIOTIC DAILY PO) Take by mouth daily.   TURMERIC PO Take by mouth.   [DISCONTINUED] escitalopram (LEXAPRO) 10 MG tablet TAKE 1 TABLET BY MOUTH EVERY DAY   escitalopram (LEXAPRO) 10 MG tablet Take 1 tablet (10 mg total) by mouth daily.   omeprazole (PRILOSEC) 20 MG capsule Take 1 capsule (20 mg total) by mouth 2 (two) times daily before a meal. (Needs to be seen before next refill) (Patient taking differently: Take 20 mg by mouth as needed. (Needs to be seen before next refill))   [DISCONTINUED] clobetasol ointment (TEMOVATE) 0.05 % Apply topically 2 (two) times a week.   No facility-administered encounter medications on file as of 11/24/2022.    Allergies  Allergen Reactions   Codeine Nausea And Vomiting    Pertinent ROS per HPI, otherwise unremarkable      Objective:  BP (!) 92/54   Pulse 70   Temp 97.9 F (36.6 C) (Temporal)   Ht 5\' 1"  (1.549 m)   Wt 109 lb 3.2 oz (49.5 kg)   SpO2 95%   BMI 20.63 kg/m    Wt Readings from Last 3 Encounters:  11/24/22 109 lb 3.2 oz (49.5 kg)  10/12/22 109 lb (49.4 kg)  09/04/22 108 lb 3.2 oz (49.1 kg)    Physical Exam Vitals and nursing note reviewed.  Constitutional:      General: She is not in acute distress.    Appearance: Normal appearance. She is normal weight. She is not ill-appearing, toxic-appearing or diaphoretic.  HENT:     Head: Normocephalic and atraumatic.     Nose: Nose normal.     Mouth/Throat:     Mouth: Mucous membranes are moist.  Eyes:     Conjunctiva/sclera: Conjunctivae  normal.     Pupils: Pupils are equal, round, and reactive to light.  Cardiovascular:     Rate and Rhythm: Normal rate and regular rhythm.     Heart sounds: Normal heart sounds.  Pulmonary:     Effort: Pulmonary effort is normal.     Breath sounds: Normal breath sounds.  Musculoskeletal:     Right lower leg: No edema.     Left lower leg: No edema.  Skin:    General: Skin is warm and dry.     Capillary Refill: Capillary refill takes less than 2 seconds.     Findings: Wound present.       Neurological:     General: No focal deficit present.     Mental Status: She is alert and oriented to person, place, and time.  Psychiatric:        Mood and Affect: Mood normal.        Behavior: Behavior normal.        Thought Content: Thought content normal.        Judgment: Judgment normal.    Physical Exam   SKIN: Non-infected, raw area on stomach.       Results for orders placed or performed in visit on 10/12/22  Sedimentation rate  Result Value Ref Range   Sed Rate 14 0 - 30 mm/h  CBC  with Differential/Platelet  Result Value Ref Range   WBC 4.8 3.8 - 10.8 Thousand/uL   RBC 4.29 3.80 - 5.10 Million/uL   Hemoglobin 13.2 11.7 - 15.5 g/dL   HCT 16.1 09.6 - 04.5 %   MCV 93.9 80.0 - 100.0 fL   MCH 30.8 27.0 - 33.0 pg   MCHC 32.8 32.0 - 36.0 g/dL   RDW 40.9 81.1 - 91.4 %   Platelets 165 140 - 400 Thousand/uL   MPV 11.5 7.5 - 12.5 fL   Neutro Abs 3,403 1,500 - 7,800 cells/uL   Lymphs Abs 1,099 850 - 3,900 cells/uL   Absolute Monocytes 250 200 - 950 cells/uL   Eosinophils Absolute 29 15 - 500 cells/uL   Basophils Absolute 19 0 - 200 cells/uL   Neutrophils Relative % 70.9 %   Total Lymphocyte 22.9 %   Monocytes Relative 5.2 %   Eosinophils Relative 0.6 %   Basophils Relative 0.4 %  COMPLETE METABOLIC PANEL WITH GFR  Result Value Ref Range   Glucose, Bld 89 65 - 99 mg/dL   BUN 9 7 - 25 mg/dL   Creat 7.82 9.56 - 2.13 mg/dL   eGFR 95 > OR = 60 YQ/MVH/8.46N6   BUN/Creatinine Ratio  SEE NOTE: 6 - 22 (calc)   Sodium 143 135 - 146 mmol/L   Potassium 4.4 3.5 - 5.3 mmol/L   Chloride 106 98 - 110 mmol/L   CO2 28 20 - 32 mmol/L   Calcium 9.9 8.6 - 10.4 mg/dL   Total Protein 6.6 6.1 - 8.1 g/dL   Albumin 4.3 3.6 - 5.1 g/dL   Globulin 2.3 1.9 - 3.7 g/dL (calc)   AG Ratio 1.9 1.0 - 2.5 (calc)   Total Bilirubin 0.5 0.2 - 1.2 mg/dL   Alkaline phosphatase (APISO) 58 37 - 153 U/L   AST 17 10 - 35 U/L   ALT 11 6 - 29 U/L       Pertinent labs & imaging results that were available during my care of the patient were reviewed by me and considered in my medical decision making.  Assessment & Plan:  Chelynne "Winn Jock" was seen today for place on side.  Diagnoses and all orders for this visit:  Abrasion of abdominal wall, initial encounter  GAD (generalized anxiety disorder) Depression, recurrent (HCC) Doing very well on below, will continue.  -     escitalopram (LEXAPRO) 10 MG tablet; Take 1 tablet (10 mg total) by mouth daily.     Assessment and Plan    Non-healing skin lesion Chronic non-healing lesion on the abdomen since July, likely due to repeated trauma and irritation from clothing and activities. No signs of infection. Previous treatments, including antibiotics and wound care solutions, have been ineffective. Discussed using CeraVe healing ointment to form a protective barrier and promote wound healing. Advised against further antibiotic use as it may impede healing. Explained the need for further evaluation if no improvement in 7-10 days and signs of infection to watch for, such as redness and increased pain. - Discontinue antibiotics - Apply CeraVe healing ointment twice daily after washing with soap and water - Keep the lesion loosely covered to allow airflow and prevent further trauma - Reassess in 7-10 days for signs of new skin growth - Contact the clinic if signs of infection develop (e.g., redness, increased pain)        Continue all other maintenance  medications.  Follow up plan: Return if symptoms worsen or fail to improve.  Continue healthy lifestyle choices, including diet (rich in fruits, vegetables, and lean proteins, and low in salt and simple carbohydrates) and exercise (at least 30 minutes of moderate physical activity daily).    The above assessment and management plan was discussed with the patient. The patient verbalized understanding of and has agreed to the management plan. Patient is aware to call the clinic if they develop any new symptoms or if symptoms persist or worsen. Patient is aware when to return to the clinic for a follow-up visit. Patient educated on when it is appropriate to go to the emergency department.   Kari Baars, FNP-C [facility] [phone]

## 2022-11-27 ENCOUNTER — Ambulatory Visit (HOSPITAL_COMMUNITY): Payer: 59 | Attending: Internal Medicine | Admitting: Occupational Therapy

## 2022-11-27 ENCOUNTER — Encounter (HOSPITAL_COMMUNITY): Payer: Self-pay | Admitting: Occupational Therapy

## 2022-11-27 DIAGNOSIS — M79641 Pain in right hand: Secondary | ICD-10-CM | POA: Diagnosis present

## 2022-11-27 DIAGNOSIS — R29898 Other symptoms and signs involving the musculoskeletal system: Secondary | ICD-10-CM | POA: Diagnosis present

## 2022-11-27 DIAGNOSIS — M79642 Pain in left hand: Secondary | ICD-10-CM | POA: Diagnosis present

## 2022-11-27 NOTE — Therapy (Unsigned)
OUTPATIENT OCCUPATIONAL THERAPY ORTHO TREATMENT  Patient Name: Katie [name] MRN: [id number] DOB:11-Sep-1960, 62 y.o., female Today's Date: 11/27/2022  PCP: Gilford Silvius, FNP REFERRING PROVIDER: Fuller Plan, MD  END OF SESSION:     Past Medical History:  Diagnosis Date   Arthritis    RA   Asthma    no attack since childhood   Blood transfusion    Contact lens/glasses fitting    wears glasses or contacts   Cystocele    Depression    External hemorrhoids    Fibromyalgia    GERD (gastroesophageal reflux disease)    past hx    Glaucoma    RIGHT EYE   HPV (human papilloma virus) infection    Neuromuscular disorder (HCC)    fibromyalgia   Syncope 2015   Pt questioned seizure with this episode - nothing like since - vaso vagal response    Wears dentures    top   Past Surgical History:  Procedure Laterality Date   ABDOMINAL EXPLORATION SURGERY  1984   bleed after hyst   APPENDECTOMY  1983   BLADDER SUSPENSION  2010   BREAST ENHANCEMENT SURGERY  2000   CARPAL TUNNEL RELEASE  1995   rt   CARPAL TUNNEL RELEASE Left 12/09/2012   Procedure: LEFT LIMITED OPEN CARPAL TUNNEL RELEASE;  Surgeon: Dominica Severin, MD;  Location: [facility];  Service: Orthopedics;  Laterality: Left;   COLONOSCOPY     CYSTOSCOPY     DIAGNOSTIC LAPAROSCOPY     Medial Branch Block  02/12/2022   Medial Branch Block  03/12/2022   MINI SHUNT INSERTION  10/14/2011   Procedure: INSERTION OF MINI SHUNT;  Surgeon: Chalmers Guest, MD;  Location: [facility] OR;  Service: Ophthalmology;  Laterality: Right;   POLYPECTOMY     Tubaligation  1980   VAGINAL HYSTERECTOMY  1983   Patient Active Problem List   Diagnosis Date Noted   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  Rationale for Evaluation and Treatment: Rehabilitation  SUBJECTIVE:   SUBJECTIVE STATEMENT: "My right hand is the worse" Pt accompanied by: self  PERTINENT HISTORY: PMH significant for RA, Asthma, Fibromyalgia, Depression. Pt continuing to have increasing pain and RA changes in her finger joints despite multiple medications.   PRECAUTIONS: None  WEIGHT BEARING RESTRICTIONS: No  PAIN:  Are you having pain? Yes: NPRS scale: 4/10 Pain location: R hand worse than L hand Pain description: aching Aggravating factors: nothing Relieving factors: sometimes medication  FALLS: Has patient fallen in last 6 months? No  LIVING ENVIRONMENT: Lives with: lives with their spouse Lives in: House/apartment  PLOF: Independent  PATIENT GOALS: To make my hand where I can use it better  NEXT MD VISIT: 01/12/22  OBJECTIVE:  Note: Objective measures were completed at Evaluation unless otherwise noted.  HAND DOMINANCE: Right  ADLs: Overall ADLs: Pt is unable to open containers and bottles without compensatory strategies. Pt also reports difficulty with manipulating small items such as buttons, zippers, and clasps.   UPPER EXTREMITY ROM:     Active ROM Right eval Left eval Right 11/27/22 Left 11/27/22  Wrist flexion 80 82 85 78  Wrist extension 79 78 75 76  Wrist ulnar deviation 48 47 53 56  Wrist radial deviation 28 44 22 36  Wrist pronation [facility] WFL  Wrist supination [facility] WFL WFL  (Blank rows = not tested)  Active ROM Right eval Left eval Right 11/27/22 Left 11/27/22  Thumb MCP (0-60) 80 75 70 55  Thumb IP (0-80) 85 80 80 80  Thumb Opposition to Small Finger Able  Able Able Able  Index MCP (0-90) 70  90 75 90  Index PIP (0-100) 95  85 100 95  Index DIP (0-70) 60   80 70 75   Long MCP (0-90) 75   90 80 90  Long PIP (0-100)  95  85 100 95  Long DIP (0-70)  65  80 70 80  Ring MCP (0-90) 80   75 85 85  Ring PIP (0-100)  100  95 105 100  Ring DIP (0-70)  60 70  75 75  Little MCP (0-90) 80   90 85 85  Little PIP (0-100)  100  100 100 100  Little DIP (0-70)  75  80 70 75  (Blank rows = not tested)   UPPER EXTREMITY MMT:     MMT Right eval Left eval Right 11/27/22 Left 11/27/22  Wrist flexion 5/5 5/5 5/5 5/5  Wrist extension 5/5 5/5 5/5 5/5  Wrist ulnar deviation 5/5 5/5 5/5 5/5  Wrist radial deviation 5/5 5/5 5/5 5/5  Wrist pronation 4+/5 4+/5 5/5 5/5  Wrist supination 4+/5 4+/5 5/5 5/5  (Blank rows = not tested)  HAND FUNCTION: Grip strength: Right: 46 lbs; Left: 47 lbs, Lateral pinch: Right: 5 lbs, Left: 6 lbs, and 3 point pinch: Right: 5 lbs, Left: 4 lbs 11/27/22: Grip strength: Right: 41 lbs; Left: 46 lbs, Lateral pinch: Right: 7 lbs, Left: 5 lbs, and 3 point pinch: Right: 6 lbs, Left: 4 lbs  COORDINATION: 9 Hole Peg test: Right: 23.15 sec; Left: 21.80 sec  SENSATION: WFL  EDEMA: No swelling noted  OBSERVATIONS: Rheumatic changes in all MCP knuckles on Both hands   TODAY'S TREATMENT:                                                                                                                              DATE:   10/30/22  -Paraffin: 12 minutes with heat on top, discussed home strategies  -A/ROM: finger taps, digit abduction  -Wrist strengthenign: 1 pound dumb bell; flexion, extension, ulnar/radial deviation 10x each  - Grip strength: flatten out red putty, cuttign cookies with PVC pipe with L/R hand 15x each     PATIENT EDUCATION: Education details: Wrist ROM and strengthening Person educated: Patient Education method: Explanation Education comprehension: verbalized understanding  HOME EXERCISE PROGRAM: 10/25: digit ROM  11/22: Wrist ROM, strengthening   GOALS: Goals reviewed with patient? Yes  SHORT TERM GOALS: Target  date: 11/27/22  Pt  will be provided a comprehensive HEP for RUE and LUE mobility to improve ADL Completions.   Goal status: MET  2.  Pt will decrease pain to 4/10 or less in order to complete BADL's without stopping to take a break due to severity of pain.   Goal status:NOT MET  3.  Pt will improve BUE wrist strength to 5/5 in all motions, in order to lift and carry moderately heavy items during cooking and cleaning.   Goal status: MET  4.  Pt will improve BUE grip strength by 8# and Pinch Strength by 3# in order to manipulate small objects by pinching and gripping on to them.   Goal status: NOT MET   ASSESSMENT:  CLINICAL IMPRESSION: Pt reports she is continuing to experience pain in BL hands. She reports that she that she has bought an at home paraffin machine. She stated that has been using it and it has been beneficial with decreasing her pain. Began session with paraffin and heat. Initiated ROM this session and gave handouts for home.   PERFORMANCE DEFICITS: in functional skills including ADLs, IADLs, dexterity, ROM, strength, pain, fascial restrictions, Fine motor control, body mechanics, and UE functional use.   PLAN:  OT FREQUENCY: every other week  OT DURATION: 4 weeks  PLANNED INTERVENTIONS: 97535 self care/ADL training, 40981 therapeutic exercise, 97530 therapeutic activity, 97140 manual therapy, 97035 ultrasound, 97018 paraffin, 19147 moist heat, 97032 electrical stimulation (manual), passive range of motion, functional mobility training, energy conservation, coping strategies training, patient/family education, and DME and/or AE instructions  RECOMMENDED OTHER SERVICES: N/A  CONSULTED AND AGREED WITH PLAN OF CARE: Patient  PLAN FOR NEXT SESSION: Trial Paraffin, A/ROM, strengthening, grip and pinch tasks.    [facility] [phone] Raygan Skarda Elane Bing Plume, OTR/L 11/27/2022, 10:14 AM

## 2022-11-27 NOTE — Patient Instructions (Signed)
AROM Exercises   1) Wrist Flexion  Start with wrist at edge of table, palm facing up. With wrist hanging slightly off table, curl wrist upward, and back down.      2) Wrist Extension  Start with wrist at edge of table, palm facing down. With wrist slightly off the edge of the table, curl wrist up and back down.      3) Radial Deviations  Start with forearm flat against a table, wrist hanging slightly off the edge, and palm facing the wall. Bending at the wrist only, and keeping palm facing the wall, bend wrist so fist is pointing towards the floor, back up to start position, and up towards the ceiling. Return to start.        4) WRIST PRONATION  Turn your forearm towards palm face down.  Keep your elbow bent and by the side of your  Body.      5) WRIST SUPINATION  Turn your forearm towards palm face up.  Keep your elbow bent and by the side of your  Body.      *Complete exercises 10-15 times each, 1-2 times per day*      Strengthening Exercises  1) WRIST EXTENSION CURLS - TABLE  Hold a small free weight, rest your forearm on a table and bend your wrist up and down with your palm face down as shown.      2) WRIST FLEXION CURLS - TABLE  Hold a small free weight, rest your forearm on a table and bend your wrist up and down with your palm face up as shown.     3) FREE WEIGHT RADIAL/ULNAR DEVIATION - TABLE  Hold a small free weight, rest your forearm on a table and bend your wrist up and down with your palm facing towards the side as shown.     4) Pronation  Forearm supported on table with wrist in neutral position. Using a weight, roll wrist so that palm faces downward. Hold for 2 seconds and return to starting position.     5) Supination  Forearm supported on table with wrist in neutral position. Using a weight, roll wrist so that palm is now facing upward. Hold for 2 seconds and return to starting position.      *Complete  exercises using 1-2 pound weight, 20-15 times each, 1-2 times per day*

## 2022-12-16 ENCOUNTER — Ambulatory Visit: Payer: 59 | Admitting: Family Medicine

## 2022-12-31 NOTE — Progress Notes (Signed)
 Office Visit Note  Patient: Katie [name]             Date of Birth: Aug 22, 1960           MRN: [id number]             PCP: Severa Rock HERO, FNP Referring: Severa Rock HERO, FNP Visit Date: 01/13/2023   Subjective:  Follow-up   History of Present Illness:   Discussed the use of AI scribe software for clinical note transcription with the patient, who gave verbal consent to proceed.  History of Present Illness   Katie [name] is a 62 y.o. female here for follow up for seronegative RA on Enbrel  50 mg subcu weekly and prednisone  5 mg daily.  She presents with persistent hand pain, particularly in the right hand. They report using various home remedies to manage the pain, including paraffin wax, heated gloves, and arthritis compression gloves, which provide some relief.  She worked with occupational therapy with some improvement in grip strength and function although hand pain remained about the same. The patient also notes shoulder pain, which they attribute to recent physical activity rather than their chronic condition.  In addition to their arthritis, the patient has a persistent rash in their waistline area that has been present since the summer. Despite following their doctor's advice for care, the rash continues to cause discomfort and shows signs of necrosis. The patient plans to see a dermatologist once their new insurance coverage begins.  The patient also has a history of trigger finger in one of their fingers, which is now notably crooked and sore to touch. They report that this finger always hurts the most. Despite the pain, the patient remains active, doing chair yoga and other stretches to maintain mobility. They note that their symptoms are tolerable and do not seem to be worsening.   Previous HPI 10/12/2022 Katie [name] is a 62 y.o. female here for follow up for seronegative RA on Enbrel  50 mg subcu weekly and prednisone  5 mg daily.  He started Enbrel  weekly  injections first dose on July 7 initially had rash and soreness around the initial injection sites.  Subsequently treated with oral antihistamines and this particular side effect has decreased.  However she still concerned about ongoing pain and stiffness in her hands worse around the third digit typically worse after physical activity and she does stay busy cleaning several homes.  Not sure whether the Enbrel  has made a big improvement in hand symptoms on top of the maintenance prednisone .  Notices benefit with heat and compression.  The rest of her joints are all doing well.   Previous HPI 04/23/2022 Katie [name] is a 62 y.o. female here for follow up for seronegative RA on prednisone  5 mg daily. Overall symptoms are doing well with this dose but still has some breakthrough symptoms with bilateral shoulder pain and some swelling in finger joints. She has noticed ongoing intermittent palpitation symptoms again despite remaining off Humira . Cardiac evaluation for this has been unremarkable so far no evidence of serious arrhythmia.    Previous HPI 10/10/21 Katie [name] is a 62 y.o. female here for follow up for seronegative RA on prednisone  5 mg daily. She stopped Humira  after last visit due to developing palpitations and pressure in her chest and neck after taking the medication. Since stopping it these symptoms completely went away.  She never recalls any similar symptoms in the past and has not had any with the  prednisone .   Previous HPI 05/26/2021 Katie [name] is a 62 y.o. female here for follow up for seronegative RA on prednisone  5 mg daily. Humira  new start 04/10/2021.  Symptoms slightly improved with the medication so far still has soreness in right hand MCPs main affected area. After about 3 weeks since starting the medication she has noticed an increase in symptoms she feels are similar to palpitations or a panic attack and like there is pressure over the upper part of her chest.   These come and go within a few minutes but have been occurring pretty much daily for weeks.  She has used her husband's A-fib monitor to check and not seen any obvious rhythm abnormality testing at home.  She tried taking hydroxyzine  as needed as previously prescribed for her did not notice any symptom improvement just sedation.   Previous HPI 03/19/2021 Katie [name] is a 62 y.o. female here for follow up for seronegative RA on prednisone  5 mg daily. Labs were checked at initial visit planning for trying biologic DMARD due to intolerance of oral medications. She has increased joint pain in multiple areas since stopping the low dose prednisone  weeks ago. Worst in neck and shoulders and in right hand. Also started having a faint skin rash around her neck and collar area in the past week.   Previous HPI 02/26/21 Katie [name] is a 62 y.o. female here for seronegative RA. She saw Dr. Everlean in 2017-2018 tried a few medications without much success but had good response to low dose prednisone . She did not tolerate methotrexate or leflunomide with liver function changes and on methotrexate also had significant hair loss. She saw Dr. Mai once for this problem but did not follow up for any long term treatment. She is currently on prednisone  5 mg daily which is controlling symptoms mostly but has a lot of pain any time she comes off this. She has noticed some increase in forward bending of neck and also has a chronic enlarged right supraclavicular lymph node for years.  Bone densitometry from 2015 showing osteopenia t-score -1.4.   DMARD Hx Enbrel - Not effective Humira - Palpitations? MTX- LFT changes, alopecia LEF- LFT changes   Review of Systems  Constitutional: Negative.  Negative for fatigue.  HENT: Negative.  Negative for mouth sores and mouth dryness.   Eyes:  Positive for dryness.  Respiratory: Negative.  Negative for shortness of breath.   Cardiovascular: Negative.  Negative for  chest pain and palpitations.  Gastrointestinal: Negative.  Negative for blood in stool, constipation and diarrhea.  Endocrine: Negative.  Negative for increased urination.  Genitourinary: Negative.  Negative for involuntary urination.  Musculoskeletal:  Positive for joint pain, joint pain, muscle weakness and morning stiffness. Negative for gait problem, joint swelling, myalgias, muscle tenderness and myalgias.  Skin: Negative.  Negative for color change, rash, hair loss and sensitivity to sunlight.  Allergic/Immunologic: Negative.  Negative for susceptible to infections.  Neurological: Negative.  Negative for dizziness and headaches.  Hematological: Negative.  Negative for swollen glands.  Psychiatric/Behavioral: Negative.  Negative for depressed mood and sleep disturbance. The patient is not nervous/anxious.     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 03/19/2021   High risk medication use 02/26/2021   Decreased libido 06/02/2018   Depression, recurrent (HCC) 05/27/2018   GAD (generalized anxiety disorder) 05/27/2018   Gastroesophageal reflux disease without esophagitis 05/27/2018  RA (rheumatoid arthritis) (HCC) 05/27/2016    Past Medical History:  Diagnosis Date   Arthritis    RA   Asthma    no attack since childhood   Blood transfusion    Contact lens/glasses fitting    wears glasses or contacts   Cystocele    Depression    External hemorrhoids    Fibromyalgia    GERD (gastroesophageal reflux disease)    past hx    Glaucoma    RIGHT EYE   HPV (human papilloma virus) infection    Neuromuscular disorder (HCC)    fibromyalgia   Syncope 2015   Pt questioned seizure with this episode - nothing like since - vaso vagal response    Wears dentures    top    Family History  Problem Relation Age of Onset   Hypertension Mother    Hypothyroidism Mother    Rheum arthritis Mother     Emphysema Father    Kidney disease Sister    Hypertension Sister    Bone cancer Daughter        Died at age 97   Celiac disease Daughter    Hypertension Daughter    Colon cancer Neg Hx    Colon polyps Neg Hx    Past Surgical History:  Procedure Laterality Date   ABDOMINAL EXPLORATION SURGERY  1984   bleed after hyst   APPENDECTOMY  1983   BLADDER SUSPENSION  2010   BREAST ENHANCEMENT SURGERY  2000   CARPAL TUNNEL RELEASE  1995   rt   CARPAL TUNNEL RELEASE Left 12/09/2012   Procedure: LEFT LIMITED OPEN CARPAL TUNNEL RELEASE;  Surgeon: Elsie Mussel, MD;  Location: [facility];  Service: Orthopedics;  Laterality: Left;   COLONOSCOPY     CYSTOSCOPY     DIAGNOSTIC LAPAROSCOPY     Medial Branch Block  02/12/2022   Medial Branch Block  03/12/2022   MINI SHUNT INSERTION  10/14/2011   Procedure: INSERTION OF MINI SHUNT;  Surgeon: Gaither Quan, MD;  Location: [facility] OR;  Service: Ophthalmology;  Laterality: Right;   POLYPECTOMY     Tubaligation  1980   VAGINAL HYSTERECTOMY  1983   Social History   Social History Narrative   Not on file   Immunization History  Administered Date(s) Administered   Influenza, Seasonal, Injecte, Preservative Fre 10/22/2015   Influenza,inj,Quad PF,6+ Mos 11/07/2018, 11/08/2020   Influenza-Unspecified 11/07/2018, 11/08/2020   Moderna Sars-Covid-2 Vaccination 05/29/2019   Pneumococcal Conjugate-13 11/10/2018   Td 02/21/2018   Tdap 01/07/2012, 02/21/2018   Zoster Recombinant(Shingrix) 05/15/2021     Objective: Vital Signs: BP 99/60   Pulse 72   Resp 16   Ht 5' 1 (1.549 m)   Wt 112 lb (50.8 kg)   BMI 21.16 kg/m    Physical Exam Eyes:     Conjunctiva/sclera: Conjunctivae normal.  Cardiovascular:     Rate and Rhythm: Normal rate and regular rhythm.  Pulmonary:     Effort: Pulmonary effort is normal.     Breath sounds: Normal breath sounds.  Musculoskeletal:     Right lower leg: No edema.     Left lower leg: No edema.   Lymphadenopathy:     Cervical: No cervical adenopathy.  Skin:    General: Skin is warm and dry.     Comments: Small bleeding skin lesion on left lower abdomen at waistline  Neurological:     Mental Status: She is alert.  Psychiatric:  Mood and Affect: Mood normal.      Musculoskeletal Exam:  Shoulders full ROM no tenderness or swelling Elbows full ROM no tenderness or swelling Wrists full ROM no tenderness or swelling Fingers full ROM, bony widening at thumbs, right 2nd-3rd MCPs, and multiple DIPs heberdon's nodes, no palpable swelling or localized tenderness Knees full ROM bilateral patellofemoral crepitus   Investigation: No additional findings.  Imaging: No results found.  Recent Labs: Lab Results  Component Value Date   WBC 4.8 10/12/2022   HGB 13.2 10/12/2022   PLT 165 10/12/2022   NA 143 10/12/2022   K 4.4 10/12/2022   CL 106 10/12/2022   CO2 28 10/12/2022   GLUCOSE 89 10/12/2022   BUN 9 10/12/2022   CREATININE 0.71 10/12/2022   BILITOT 0.5 10/12/2022   ALKPHOS 92 04/10/2022   AST 17 10/12/2022   ALT 11 10/12/2022   PROT 6.6 10/12/2022   ALBUMIN 4.1 04/10/2022   CALCIUM 9.9 10/12/2022   GFRAA 89 05/27/2018   QFTBGOLDPLUS NEGATIVE 04/23/2022    Speciality Comments: MTX stopped due to elevated LFTs/hairloss; leflunomide stopped due to elevated LFTs, Humira  started 04/10/21  Procedures:  No procedures performed Allergies: Codeine   Assessment / Plan:     Visit Diagnoses: Rheumatoid arthritis involving both hands, unspecified whether rheumatoid factor present (HCC) - Plan: etanercept  (ENBREL  SURECLICK) 50 MG/ML injection, predniSONE  (DELTASONE ) 5 MG tablet, Sedimentation rate Inflammatory disease appears well controlled on current regimen. No recent flare. -Checking sed rate for disease activity monitoring -Continue Enbrel  50 mg [place] weekly -Continue prednisone  5 mg daily  High risk medication use - Enbrel  50 mg subcu weekly - Plan: etanercept   (ENBREL  SURECLICK) 50 MG/ML injection, CBC with Differential/Platelet, COMPLETE METABOLIC PANEL WITH GFR No serious interval infections. No recurrent of Enbrel  injection reactions. TB negative screening 04/2022. -Checking CBC and CMP medication monitoring on Enbrel  and prednisone .  Osteoarthritis Persistent hand pain, worse on the right. Utilizing paraffin wax, heated gloves, and compression gloves for symptomatic relief. No joint swelling or redness. -Continue current symptomatic treatments.  Non-healing abdominal rash Persistent since summer. -Continue current treatment and follow up with dermatologist once insurance allows.    Orders: Orders Placed This Encounter  Procedures   Sedimentation rate   CBC with Differential/Platelet   COMPLETE METABOLIC PANEL WITH GFR   Meds ordered this encounter  Medications   etanercept  (ENBREL  SURECLICK) 50 MG/ML injection    Sig: Inject 50 mg into the skin once a week.    Dispense:  12 mL    Refill:  0    Prescription Type::   Renewal   predniSONE  (DELTASONE ) 5 MG tablet    Sig: Take 1 tablet (5 mg total) by mouth daily with breakfast.    Dispense:  90 tablet    Refill:  0     Follow-Up Instructions: Return in about 3 months (around 04/13/2023) for RA on ENB/GC f/u 3mos.   Lonni LELON Ester, MD  Note - This record has been created using [employer].  Chart creation errors have been sought, but may not always  have been located. Such creation errors do not reflect on  the standard of medical care.

## 2023-01-13 ENCOUNTER — Encounter: Payer: Self-pay | Admitting: Internal Medicine

## 2023-01-13 ENCOUNTER — Ambulatory Visit: Payer: 59 | Attending: Internal Medicine | Admitting: Internal Medicine

## 2023-01-13 VITALS — BP 99/60 | HR 72 | Resp 16 | Ht 61.0 in | Wt 112.0 lb

## 2023-01-13 DIAGNOSIS — Z79899 Other long term (current) drug therapy: Secondary | ICD-10-CM | POA: Diagnosis not present

## 2023-01-13 DIAGNOSIS — M069 Rheumatoid arthritis, unspecified: Secondary | ICD-10-CM | POA: Insufficient documentation

## 2023-01-13 DIAGNOSIS — Z7952 Long term (current) use of systemic steroids: Secondary | ICD-10-CM | POA: Insufficient documentation

## 2023-01-13 MED ORDER — ENBREL SURECLICK 50 MG/ML ~~LOC~~ SOAJ: 50.0000 mg | SUBCUTANEOUS | 0 refills | Status: DC

## 2023-01-13 MED ORDER — PREDNISONE 5 MG PO TABS: 5.0000 mg | ORAL_TABLET | Freq: Every day | ORAL | 0 refills | Status: DC

## 2023-01-14 LAB — COMPLETE METABOLIC PANEL WITH GFR
AG Ratio: 2 (calc) (ref 1.0–2.5)
ALT: 17 U/L (ref 6–29)
AST: 18 U/L (ref 10–35)
Albumin: 4.4 g/dL (ref 3.6–5.1)
Alkaline phosphatase (APISO): 62 U/L (ref 37–153)
BUN: 17 mg/dL (ref 7–25)
CO2: 28 mmol/L (ref 20–32)
Calcium: 10 mg/dL (ref 8.6–10.4)
Chloride: 103 mmol/L (ref 98–110)
Creat: 0.74 mg/dL (ref 0.50–1.05)
Globulin: 2.2 g/dL (ref 1.9–3.7)
Glucose, Bld: 87 mg/dL (ref 65–99)
Potassium: 4.2 mmol/L (ref 3.5–5.3)
Sodium: 140 mmol/L (ref 135–146)
Total Bilirubin: 0.5 mg/dL (ref 0.2–1.2)
Total Protein: 6.6 g/dL (ref 6.1–8.1)
eGFR: 90 mL/min/{1.73_m2} (ref >=60)

## 2023-01-14 LAB — CBC WITH DIFFERENTIAL/PLATELET
Absolute Lymphocytes: 1090 {cells}/uL (ref 850–3900)
Absolute Monocytes: 309 {cells}/uL (ref 200–950)
Basophils Absolute: 32 {cells}/uL (ref 0–200)
Basophils Relative: 0.5 %
Eosinophils Absolute: 32 {cells}/uL (ref 15–500)
Eosinophils Relative: 0.5 %
HCT: 38.7 % (ref 35.0–45.0)
Hemoglobin: 13 g/dL (ref 11.7–15.5)
MCH: 30.8 pg (ref 27.0–33.0)
MCHC: 33.6 g/dL (ref 32.0–36.0)
MCV: 91.7 fL (ref 80.0–100.0)
MPV: 11.1 fL (ref 7.5–12.5)
Monocytes Relative: 4.9 %
Neutro Abs: 4838 {cells}/uL (ref 1500–7800)
Neutrophils Relative %: 76.8 %
Platelets: 182 10*3/uL (ref 140–400)
RBC: 4.22 10*6/uL (ref 3.80–5.10)
RDW: 12.8 % (ref 11.0–15.0)
Total Lymphocyte: 17.3 %
WBC: 6.3 10*3/uL (ref 3.8–10.8)

## 2023-01-14 LAB — SEDIMENTATION RATE: Sed Rate: 11 mm/h (ref 0–30)

## 2023-01-23 ENCOUNTER — Other Ambulatory Visit: Payer: Self-pay | Admitting: Family Medicine

## 2023-01-23 DIAGNOSIS — F339 Major depressive disorder, recurrent, unspecified: Secondary | ICD-10-CM

## 2023-01-23 DIAGNOSIS — F411 Generalized anxiety disorder: Secondary | ICD-10-CM

## 2023-02-15 ENCOUNTER — Encounter: Payer: Self-pay | Admitting: Internal Medicine

## 2023-02-16 ENCOUNTER — Telehealth: Payer: Self-pay | Admitting: Pharmacist

## 2023-02-16 NOTE — Telephone Encounter (Signed)
New insurance per patient.  Submitted a Prior Authorization request to [facility] for ENBREL via CoverMyMeds. Will update once we receive a response.  Key: [id number]  Chesley Mires, PharmD, MPH, BCPS, CPP Clinical Pharmacist (Rheumatology and Pulmonology)

## 2023-02-17 DIAGNOSIS — Z1231 Encounter for screening mammogram for malignant neoplasm of breast: Secondary | ICD-10-CM | POA: Diagnosis not present

## 2023-02-18 ENCOUNTER — Encounter: Payer: Self-pay | Admitting: Family Medicine

## 2023-02-18 ENCOUNTER — Other Ambulatory Visit: Payer: Self-pay | Admitting: Family Medicine

## 2023-02-18 DIAGNOSIS — L821 Other seborrheic keratosis: Secondary | ICD-10-CM | POA: Diagnosis not present

## 2023-02-18 DIAGNOSIS — F339 Major depressive disorder, recurrent, unspecified: Secondary | ICD-10-CM

## 2023-02-18 DIAGNOSIS — F411 Generalized anxiety disorder: Secondary | ICD-10-CM

## 2023-02-18 DIAGNOSIS — C44519 Basal cell carcinoma of skin of other part of trunk: Secondary | ICD-10-CM | POA: Diagnosis not present

## 2023-02-18 NOTE — Telephone Encounter (Signed)
Katie [name] pt NTBS 30-d given 01/25/23

## 2023-02-18 NOTE — Telephone Encounter (Signed)
[facility] letter mailed

## 2023-02-19 ENCOUNTER — Other Ambulatory Visit (HOSPITAL_COMMUNITY): Payer: Self-pay

## 2023-02-19 NOTE — Telephone Encounter (Signed)
Received notification from [facility] regarding a prior authorization for ENBREL. Authorization has been APPROVED from 02/16/2023 to 08/16/2023. Approval letter sent to scan center.  Per test claim, copay for 28 days supply is $1918.56  Authorization # [id number]  Printed [employer] application for patient assistnace.  MyChart message sent to patient for how she'd like application sent to her  Chesley Mires, PharmD, MPH, BCPS, CPP Clinical Pharmacist (Rheumatology and Pulmonology)

## 2023-02-20 ENCOUNTER — Other Ambulatory Visit: Payer: Self-pay | Admitting: Family Medicine

## 2023-02-20 DIAGNOSIS — F339 Major depressive disorder, recurrent, unspecified: Secondary | ICD-10-CM

## 2023-02-20 DIAGNOSIS — F411 Generalized anxiety disorder: Secondary | ICD-10-CM

## 2023-02-22 NOTE — Telephone Encounter (Signed)
Patient portoin of Enbrel application emailed to her. Provider form placed in Dr. Gregary Cromer folder for signature

## 2023-02-24 NOTE — Telephone Encounter (Signed)
Received signed provider form from Dr. Dimple Casey for Enbrel PAP

## 2023-02-26 ENCOUNTER — Encounter: Payer: Self-pay | Admitting: Family Medicine

## 2023-03-03 NOTE — Telephone Encounter (Signed)
 Received signed patient forms.  Submitted Patient Assistance Application to [employer] for ENBREL along with provider portion, patient portion, PA, medication list, insurance card copy. Will update patient when we receive a response.  Phone #: [phone] Fax #: [phone]  Chesley Mires, PharmD, MPH, BCPS, CPP Clinical Pharmacist (Rheumatology and Pulmonology)

## 2023-03-05 DIAGNOSIS — R140 Abdominal distension (gaseous): Secondary | ICD-10-CM | POA: Diagnosis not present

## 2023-03-05 DIAGNOSIS — K5900 Constipation, unspecified: Secondary | ICD-10-CM | POA: Diagnosis not present

## 2023-03-10 ENCOUNTER — Other Ambulatory Visit: Payer: Self-pay

## 2023-03-10 ENCOUNTER — Ambulatory Visit (INDEPENDENT_AMBULATORY_CARE_PROVIDER_SITE_OTHER): Payer: 59 | Admitting: Family Medicine

## 2023-03-10 ENCOUNTER — Telehealth: Payer: Self-pay

## 2023-03-10 ENCOUNTER — Encounter: Payer: Self-pay | Admitting: Family Medicine

## 2023-03-10 VITALS — BP 99/56 | HR 65 | Temp 96.6°F | Ht 61.0 in | Wt 118.8 lb

## 2023-03-10 DIAGNOSIS — K5901 Slow transit constipation: Secondary | ICD-10-CM | POA: Diagnosis not present

## 2023-03-10 DIAGNOSIS — M05742 Rheumatoid arthritis with rheumatoid factor of left hand without organ or systems involvement: Secondary | ICD-10-CM

## 2023-03-10 DIAGNOSIS — K219 Gastro-esophageal reflux disease without esophagitis: Secondary | ICD-10-CM | POA: Diagnosis not present

## 2023-03-10 DIAGNOSIS — M05741 Rheumatoid arthritis with rheumatoid factor of right hand without organ or systems involvement: Secondary | ICD-10-CM | POA: Diagnosis not present

## 2023-03-10 DIAGNOSIS — F339 Major depressive disorder, recurrent, unspecified: Secondary | ICD-10-CM

## 2023-03-10 DIAGNOSIS — F411 Generalized anxiety disorder: Secondary | ICD-10-CM | POA: Diagnosis not present

## 2023-03-10 DIAGNOSIS — C44519 Basal cell carcinoma of skin of other part of trunk: Secondary | ICD-10-CM | POA: Diagnosis not present

## 2023-03-10 DIAGNOSIS — Z79899 Other long term (current) drug therapy: Secondary | ICD-10-CM

## 2023-03-10 DIAGNOSIS — M069 Rheumatoid arthritis, unspecified: Secondary | ICD-10-CM

## 2023-03-10 LAB — LIPID PANEL

## 2023-03-10 MED ORDER — BUSPIRONE HCL 10 MG PO TABS: 10.0000 mg | ORAL_TABLET | Freq: Every day | ORAL | 1 refills | Status: DC

## 2023-03-10 MED ORDER — OMEPRAZOLE 20 MG PO CPDR
20.0000 mg | DELAYED_RELEASE_CAPSULE | Freq: Two times a day (BID) | ORAL | 1 refills | Status: DC
Start: 2023-03-10 — End: 2023-09-07

## 2023-03-10 MED ORDER — ESCITALOPRAM OXALATE 10 MG PO TABS: 10.0000 mg | ORAL_TABLET | Freq: Every day | ORAL | 1 refills | Status: DC

## 2023-03-10 NOTE — Telephone Encounter (Signed)
 Received a fax from [facility] stating the patient's Enbrel needs to be sent to [facility] because they are in network and [facility] is not. After reviewing the chart, noticed patient assistance is being submitted, so have not worked up new prescription yet.

## 2023-03-10 NOTE — Progress Notes (Signed)
 Subjective:  Patient ID: Katie [name], female    DOB: 1960/10/19, 63 y.o.   MRN: [id number]  Patient Care Team: Sonny Masters, FNP as PCP - General (Family Medicine)   Chief Complaint:  Medical Management of Chronic Issues (Chronic check up ) and Weight Gain (Patient states she started gaining weight a few months ago.  Would like to know if the lexapro could cause her to gain weight? She is also bloated. )   HPI: Katie [name] is a 63 y.o. female presenting on 03/10/2023 for Medical Management of Chronic Issues (Chronic check up ) and Weight Gain (Patient states she started gaining weight a few months ago.  Would like to know if the lexapro could cause her to gain weight? She is also bloated. )   Discussed the use of AI scribe software for clinical note transcription with the patient, who gave verbal consent to proceed.  History of Present Illness   [name] "Katie [name]" is a 63 year old female with depression and anxiety who presents for medication refills and concerns about Lexapro causing weight gain.  She is concerned about potential weight gain associated with Lexapro, feeling bloated and attributing this to her medication. Despite these concerns, she appreciates the significant improvement in managing depression and anxiety, which helps her stay calm and avoid social media. Her BMI is 22, within the normal range.  She has a history of a small cyst on her left ovary, discovered during an ultrasound performed to investigate bloating. She experiences constipation and is considering using Miralax for relief. She has not yet tried any clean-out methods but is contemplating it due to feelings of incomplete bowel movements despite using a stool softener daily.  She is currently taking Buspar once daily for anxiety and continues with Enbrel and prednisone for her rheumatologic condition. She has been on prednisone for years and is aware of its potential side effects, including  weight gain and bone thinning. She has a bone density scan scheduled for this Friday due to concerns about osteoporosis, as she has been told she has osteopenia.  She recently had a basal cell carcinoma spot treated and will follow up on the 17th. She has a history of skin issues, including a spot that bled persistently, leading to the cancer diagnosis. She prefers female doctors, feeling they are more understanding of her experiences.         03/10/2023   10:56 AM 01/13/2023    1:05 PM 11/24/2022    2:57 PM 09/04/2022    8:33 AM 08/07/2022    8:06 AM  Depression screen PHQ 2/9  Decreased Interest 0 0 0 0 2  Down, Depressed, Hopeless 0 0 0 1 2  PHQ - 2 Score 0 0 0 1 4  Altered sleeping 0  0 1 3  Tired, decreased energy 1  1 1 1   Change in appetite 1  0 0 1  Feeling bad or failure about yourself  0  0 0 0  Trouble concentrating 1  0 0 1  Moving slowly or fidgety/restless 0  0 0 2  Suicidal thoughts 0  0 0 0  PHQ-9 Score 3  1 3 12   Difficult doing work/chores Not difficult at all  Not difficult at all Not difficult at all Very difficult      03/10/2023   10:56 AM 11/24/2022    2:57 PM 09/04/2022    8:33 AM 08/07/2022    8:06 AM  GAD 7 : Generalized Anxiety Score  Nervous, Anxious, on Edge 0 1 1 3   Control/stop worrying 0 1 1 3   Worry too much - different things 0 1 1 3   Trouble relaxing 0 0 1 3  Restless 0 0 0 3  Easily annoyed or irritable 0 1 0 2  Afraid - awful might happen 1 1 1 3   Total GAD 7 Score 1 5 5 20   Anxiety Difficulty Not difficult at all Not difficult at all Not difficult at all Very difficult        Relevant past medical, surgical, family, and social history reviewed and updated as indicated.  Allergies and medications reviewed and updated. Data reviewed: Chart in Epic.   Past Medical History:  Diagnosis Date   Arthritis    RA   Asthma    no attack since childhood   Blood transfusion    Contact lens/glasses fitting    wears glasses or contacts   Cystocele     Depression    External hemorrhoids    Fibromyalgia    GERD (gastroesophageal reflux disease)    past hx    Glaucoma    RIGHT EYE   HPV (human papilloma virus) infection    Neuromuscular disorder (HCC)    fibromyalgia   Syncope 2015   Pt questioned seizure with this episode - nothing like since - vaso vagal response    Wears dentures    top    Past Surgical History:  Procedure Laterality Date   ABDOMINAL EXPLORATION SURGERY  1984   bleed after hyst   APPENDECTOMY  1983   BLADDER SUSPENSION  2010   BREAST ENHANCEMENT SURGERY  2000   CARPAL TUNNEL RELEASE  1995   rt   CARPAL TUNNEL RELEASE Left 12/09/2012   Procedure: LEFT LIMITED OPEN CARPAL TUNNEL RELEASE;  Surgeon: Dominica Severin, MD;  Location: [facility];  Service: Orthopedics;  Laterality: Left;   COLONOSCOPY     CYSTOSCOPY     DIAGNOSTIC LAPAROSCOPY     Medial Branch Block  02/12/2022   Medial Branch Block  03/12/2022   MINI SHUNT INSERTION  10/14/2011   Procedure: INSERTION OF MINI SHUNT;  Surgeon: Chalmers Guest, MD;  Location: [facility] OR;  Service: Ophthalmology;  Laterality: Right;   POLYPECTOMY     Tubaligation  1980   VAGINAL HYSTERECTOMY  1983    Social History   Socioeconomic History   Marital status: Married    Spouse name: Not on file   Number of children: 1   Years of education: Not on file   Highest education level: Not on file  Occupational History    Employer: [employer]  Tobacco Use   Smoking status: Former    Current packs/day: 0.00    Average packs/day: 0.3 packs/day for 2.0 years (0.5 ttl pk-yrs)    Types: Cigarettes    Start date: 10/12/1991    Quit date: 10/11/1993    Years since quitting: 29.4    Passive exposure: Never   Smokeless tobacco: Never  Vaping Use   Vaping status: Never Used  Substance and Sexual Activity   Alcohol use: Not Currently   Drug use: No   Sexual activity: Not on file  Other Topics Concern   Not on file  Social History Narrative   Not  on file   Social Drivers of Health   Financial Resource Strain: Not on file  Food Insecurity: Not on file  Transportation Needs: Not on file  Physical Activity: Not on file  Stress: Not on file  Social Connections: Not on file  Intimate Partner Violence: Not on file    Outpatient Encounter Medications as of 03/10/2023  Medication Sig   acetaminophen (TYLENOL) 500 MG tablet Take 500 mg by mouth every 6 (six) hours as needed.   Ascorbic Acid (VITAMIN C ADULT GUMMIES PO)    Aspirin 81 MG CAPS Aspirin 81 mg   calcium carbonate 1250 MG capsule Take 1,250 mg by mouth 2 (two) times daily with a meal.   Cyanocobalamin (B-12 PO) Take by mouth.   etanercept (ENBREL SURECLICK) 50 MG/ML injection Inject 50 mg into the skin once a week.   folic acid (FOLVITE) 800 MCG tablet Take 400 mcg by mouth daily.   ibuprofen (ADVIL) 200 MG tablet Take 200 mg by mouth every 6 (six) hours as needed.   MAGNESIUM PO Take by mouth 3 times/day as needed-between meals & bedtime.   Multiple Vitamin (MULTIVITAMIN) tablet Take 1 tablet by mouth daily.     ondansetron (ZOFRAN) 4 MG tablet Take 1 tablet (4 mg total) by mouth every 8 (eight) hours as needed for nausea or vomiting.   predniSONE (DELTASONE) 5 MG tablet Take 1 tablet (5 mg total) by mouth daily with breakfast.   PROAIR HFA 108 (90 Base) MCG/ACT inhaler TAKE 2 PUFFS BY MOUTH EVERY 6 HOURS AS NEEDED FOR WHEEZE OR SHORTNESS OF BREATH   Probiotic Product (PROBIOTIC DAILY PO) Take by mouth daily.   TURMERIC PO Take by mouth.   [DISCONTINUED] busPIRone (BUSPAR) 10 MG tablet TAKE 1 TABLET BY MOUTH TWICE A DAY (Patient taking differently: Take 10 mg by mouth daily.)   [DISCONTINUED] escitalopram (LEXAPRO) 10 MG tablet Take 1 tablet (10 mg total) by mouth daily.   busPIRone (BUSPAR) 10 MG tablet Take 1 tablet (10 mg total) by mouth daily.   escitalopram (LEXAPRO) 10 MG tablet Take 1 tablet (10 mg total) by mouth daily.   omeprazole (PRILOSEC) 20 MG capsule Take 1  capsule (20 mg total) by mouth 2 (two) times daily before a meal.   [DISCONTINUED] omeprazole (PRILOSEC) 20 MG capsule Take 1 capsule (20 mg total) by mouth 2 (two) times daily before a meal. (Needs to be seen before next refill) (Patient taking differently: Take 20 mg by mouth as needed. (Needs to be seen before next refill))   No facility-administered encounter medications on file as of 03/10/2023.    Allergies  Allergen Reactions   Codeine Nausea And Vomiting    Pertinent ROS per HPI, otherwise unremarkable      Objective:  BP (!) 99/56   Pulse 65   Temp (!) 96.6 F (35.9 C)   Ht 5\' 1"  (1.549 m)   Wt 118 lb 12.8 oz (53.9 kg)   SpO2 97%   BMI 22.45 kg/m    Wt Readings from Last 3 Encounters:  03/10/23 118 lb 12.8 oz (53.9 kg)  01/13/23 112 lb (50.8 kg)  11/24/22 109 lb 3.2 oz (49.5 kg)    Physical Exam Vitals and nursing note reviewed.  Constitutional:      General: She is not in acute distress.    Appearance: She is normal weight. She is not ill-appearing, toxic-appearing or diaphoretic.  HENT:     Head: Normocephalic and atraumatic.     Nose: Nose normal.     Mouth/Throat:     Pharynx: Oropharynx is clear.  Eyes:     Conjunctiva/sclera: Conjunctivae normal.     Pupils:  Pupils are equal, round, and reactive to light.  Cardiovascular:     Rate and Rhythm: Normal rate and regular rhythm.     Heart sounds: Normal heart sounds.  Pulmonary:     Effort: Pulmonary effort is normal.     Breath sounds: Normal breath sounds.  Abdominal:     General: Bowel sounds are normal.     Palpations: Abdomen is soft.     Tenderness: There is no abdominal tenderness.  Musculoskeletal:     Cervical back: Normal range of motion and neck supple.     Right lower leg: No edema.     Left lower leg: No edema.  Skin:    General: Skin is warm and dry.     Capillary Refill: Capillary refill takes less than 2 seconds.       Neurological:     General: No focal deficit present.      Mental Status: She is alert and oriented to person, place, and time.  Psychiatric:        Mood and Affect: Mood normal.        Behavior: Behavior normal.        Thought Content: Thought content normal.        Judgment: Judgment normal.    Physical Exam   MEASUREMENTS: BMI- 22.0.        Results for orders placed or performed in visit on 01/13/23  Sedimentation rate   Collection Time: 01/13/23  1:29 PM  Result Value Ref Range   Sed Rate 11 0 - 30 mm/h  CBC with Differential/Platelet   Collection Time: 01/13/23  1:29 PM  Result Value Ref Range   WBC 6.3 3.8 - 10.8 Thousand/uL   RBC 4.22 3.80 - 5.10 Million/uL   Hemoglobin 13.0 11.7 - 15.5 g/dL   HCT 16.1 09.6 - 04.5 %   MCV 91.7 80.0 - 100.0 fL   MCH 30.8 27.0 - 33.0 pg   MCHC 33.6 32.0 - 36.0 g/dL   RDW 40.9 81.1 - 91.4 %   Platelets 182 140 - 400 Thousand/uL   MPV 11.1 7.5 - 12.5 fL   Neutro Abs 4,838 1,500 - 7,800 cells/uL   Absolute Lymphocytes 1,090 850 - 3,900 cells/uL   Absolute Monocytes 309 200 - 950 cells/uL   Eosinophils Absolute 32 15 - 500 cells/uL   Basophils Absolute 32 0 - 200 cells/uL   Neutrophils Relative % 76.8 %   Total Lymphocyte 17.3 %   Monocytes Relative 4.9 %   Eosinophils Relative 0.5 %   Basophils Relative 0.5 %  COMPLETE METABOLIC PANEL WITH GFR   Collection Time: 01/13/23  1:29 PM  Result Value Ref Range   Glucose, Bld 87 65 - 99 mg/dL   BUN 17 7 - 25 mg/dL   Creat 7.82 9.56 - 2.13 mg/dL   eGFR 90 > OR = 60 YQ/MVH/8.46N6   BUN/Creatinine Ratio SEE NOTE: 6 - 22 (calc)   Sodium 140 135 - 146 mmol/L   Potassium 4.2 3.5 - 5.3 mmol/L   Chloride 103 98 - 110 mmol/L   CO2 28 20 - 32 mmol/L   Calcium 10.0 8.6 - 10.4 mg/dL   Total Protein 6.6 6.1 - 8.1 g/dL   Albumin 4.4 3.6 - 5.1 g/dL   Globulin 2.2 1.9 - 3.7 g/dL (calc)   AG Ratio 2.0 1.0 - 2.5 (calc)   Total Bilirubin 0.5 0.2 - 1.2 mg/dL   Alkaline phosphatase (APISO) 62 37 - 153 U/L  AST 18 10 - 35 U/L   ALT 17 6 - 29 U/L        Pertinent labs & imaging results that were available during my care of the patient were reviewed by me and considered in my medical decision making.  Assessment & Plan:  Leela "Katie [name]" was seen today for medical management of chronic issues and weight gain.  Diagnoses and all orders for this visit:  Depression, recurrent (HCC) -     busPIRone (BUSPAR) 10 MG tablet; Take 1 tablet (10 mg total) by mouth daily. -     escitalopram (LEXAPRO) 10 MG tablet; Take 1 tablet (10 mg total) by mouth daily. -     CMP14+EGFR -     Thyroid Panel With TSH -     VITAMIN D 25 Hydroxy (Vit-D Deficiency, Fractures)  GAD (generalized anxiety disorder) -     busPIRone (BUSPAR) 10 MG tablet; Take 1 tablet (10 mg total) by mouth daily. -     escitalopram (LEXAPRO) 10 MG tablet; Take 1 tablet (10 mg total) by mouth daily. -     CMP14+EGFR -     CBC with Differential/Platelet -     Thyroid Panel With TSH -     VITAMIN D 25 Hydroxy (Vit-D Deficiency, Fractures)  Gastroesophageal reflux disease without esophagitis -     omeprazole (PRILOSEC) 20 MG capsule; Take 1 capsule (20 mg total) by mouth 2 (two) times daily before a meal. -     CBC with Differential/Platelet  Rheumatoid arthritis involving both hands with positive rheumatoid factor (HCC) -     CMP14+EGFR -     CBC with Differential/Platelet -     VITAMIN D 25 Hydroxy (Vit-D Deficiency, Fractures)  High risk medication use -     CMP14+EGFR -     CBC with Differential/Platelet -     Lipid panel -     Thyroid Panel With TSH -     VITAMIN D 25 Hydroxy (Vit-D Deficiency, Fractures)  Slow transit constipation -     CMP14+EGFR -     CBC with Differential/Platelet -     Thyroid Panel With TSH  Basal cell carcinoma (BCC) of skin of other part of torso -     CBC with Differential/Platelet     Assessment and Plan    Depression and Anxiety Her depression and anxiety symptoms are well-controlled with Lexapro and Buspar. She reports feeling  calm, avoiding social media, and engaging in mind-stimulating activities. Although there is concern about weight gain potentially associated with Lexapro, her BMI is 22, which is within the normal range. She is satisfied with Lexapro and does not wish to discontinue it despite concerns about bloating and weight gain. Less than 1% of people report weight gain with Lexapro. Bloating and gas are also potential side effects. - Continue Lexapro - Continue Buspar once daily - Discuss potential side effects of Lexapro, including weight gain and bloating - Consider Gas-X for bloating - Monitor weight and symptoms  Constipation She reports bloating and infrequent bowel movements. An ultrasound showed a small cyst on the left ovary and significant stool in the intestines. She has not tried any bowel cleanout methods yet. Lexapro can cause bloating and gas, contributing to her symptoms. - Perform Miralax cleanout with four capfuls in 32 ounces of water - Consider Linzess if Miralax cleanout is ineffective - Provide Miralax cleanout instructions - Monitor bowel movement regularity  Rheumatoid Arthritis She is under the care of a rheumatologist  and is currently on Enbrel and daily prednisone. There is concern about the long-term use of prednisone and its side effects, including weight gain and bone thinning. She plans to discuss the possibility of discontinuing prednisone with her rheumatologist. - Continue Enbrel - Continue daily prednisone - Discuss prednisone use with rheumatologist at next appointment in April  Osteoporosis She is concerned about osteoporosis, especially due to long-term prednisone use. She has a bone density scan scheduled for this Friday. She is aware of the risks of prednisone, including bone thinning. - Perform bone density scan - Check vitamin D and calcium levels - Discuss osteoporosis management with rheumatologist  Basal Cell Carcinoma She had a basal cell carcinoma lesion  treated by burning. She is scheduled for a follow-up appointment on March 17th to ensure proper healing and to monitor for any new suspicious lesions. - Follow up with dermatologist on March 17th - Monitor for new or persistent skin lesions  General Health Maintenance She is generally in good health, with a BMI of 22. Regular exercise is advised to manage weight and overall health. - Encourage regular exercise as weather permits - Monitor BMI and overall health          Continue all other maintenance medications.  Follow up plan: Return in about 6 months (around 09/10/2023) for Annual Physical.   Continue healthy lifestyle choices, including diet (rich in fruits, vegetables, and lean proteins, and low in salt and simple carbohydrates) and exercise (at least 30 minutes of moderate physical activity daily).  Educational handout given for constipation   The above assessment and management plan was discussed with the patient. The patient verbalized understanding of and has agreed to the management plan. Patient is aware to call the clinic if they develop any new symptoms or if symptoms persist or worsen. Patient is aware when to return to the clinic for a follow-up visit. Patient educated on when it is appropriate to go to the emergency department.   Kari Baars, FNP-C [facility] [phone]

## 2023-03-10 NOTE — Telephone Encounter (Signed)
 Opened in error

## 2023-03-10 NOTE — Patient Instructions (Signed)
 Thank you for coming in to clinic today.  1. Your symptoms are consistent with Constipation, likely cause of your General Abdominal Pain / Cramping. 2. Start with Miralax, prescription was sent to pharmacy. First dose 68g (4 capfuls) in 32oz water over 1 to 2 hours for clean out. Next day start 17g or 1 capful daily, may adjust dose up or down by half a capful every few days. Recommend to take this medicine daily for next 1-2 weeks, you may need to use it longer if needed. - Goal is to have soft regular bowel movement 1-3x daily, if too runny or diarrhea, then reduce dose of the medicine to every other day.  Improve water intake, hydration will help Also recommend increased vegetables, fruits, fiber intake Can try daily Metamucil or Fiber supplement at pharmacy over the counter  Follow-up if symptoms are not improving with bowel movements, or if pain worsens, develop fevers, nausea, vomiting.  Please schedule a follow-up appointment with Kari Baars, FNP, in 1 month to follow-up Constipation  If you have any other questions or concerns, please feel free to call the clinic to contact me. You may also schedule an earlier appointment if necessary.  However, if your symptoms get significantly worse, please go to the Emergency Department to seek immediate medical attention.

## 2023-03-11 ENCOUNTER — Encounter: Payer: Self-pay | Admitting: Family Medicine

## 2023-03-11 LAB — CBC WITH DIFFERENTIAL/PLATELET
Basophils Absolute: 0 10*3/uL (ref 0.0–0.2)
Basos: 1 %
EOS (ABSOLUTE): 0.1 10*3/uL (ref 0.0–0.4)
Eos: 2 %
Hematocrit: 38.2 % (ref 34.0–46.6)
Hemoglobin: 12.6 g/dL (ref 11.1–15.9)
Immature Grans (Abs): 0 10*3/uL (ref 0.0–0.1)
Immature Granulocytes: 0 %
Lymphocytes Absolute: 2.2 10*3/uL (ref 0.7–3.1)
Lymphs: 56 %
MCH: 31.3 pg (ref 26.6–33.0)
MCHC: 33 g/dL (ref 31.5–35.7)
MCV: 95 fL (ref 79–97)
Monocytes Absolute: 0.4 10*3/uL (ref 0.1–0.9)
Monocytes: 11 %
Neutrophils Absolute: 1.2 10*3/uL — ABNORMAL LOW (ref 1.4–7.0)
Neutrophils: 30 %
Platelets: 163 10*3/uL (ref 150–450)
RBC: 4.03 x10E6/uL (ref 3.77–5.28)
RDW: 13.1 % (ref 11.7–15.4)
WBC: 3.9 10*3/uL (ref 3.4–10.8)

## 2023-03-11 LAB — THYROID PANEL WITH TSH
Free Thyroxine Index: 1.4 (ref 1.2–4.9)
T3 Uptake Ratio: 25 % (ref 24–39)
T4, Total: 5.4 ug/dL (ref 4.5–12.0)
TSH: 2.21 u[IU]/mL (ref 0.450–4.500)

## 2023-03-11 LAB — CMP14+EGFR
ALT: 15 IU/L (ref 0–32)
AST: 17 IU/L (ref 0–40)
Albumin: 4.3 g/dL (ref 3.9–4.9)
Alkaline Phosphatase: 63 IU/L (ref 44–121)
BUN/Creatinine Ratio: 18 (ref 12–28)
BUN: 14 mg/dL (ref 8–27)
Bilirubin Total: 0.4 mg/dL (ref 0.0–1.2)
CO2: 24 mmol/L (ref 20–29)
Calcium: 9.7 mg/dL (ref 8.7–10.3)
Chloride: 107 mmol/L — ABNORMAL HIGH (ref 96–106)
Creatinine, Ser: 0.77 mg/dL (ref 0.57–1.00)
Globulin, Total: 2 g/dL (ref 1.5–4.5)
Glucose: 74 mg/dL (ref 70–99)
Potassium: 4 mmol/L (ref 3.5–5.2)
Sodium: 145 mmol/L — ABNORMAL HIGH (ref 134–144)
Total Protein: 6.3 g/dL (ref 6.0–8.5)
eGFR: 86 mL/min/{1.73_m2} (ref >59)

## 2023-03-11 LAB — LIPID PANEL
Cholesterol, Total: 234 mg/dL — ABNORMAL HIGH (ref 100–199)
HDL: 83 mg/dL (ref >39)
LDL CALC COMMENT:: 2.8 ratio (ref 0.0–4.4)
LDL Chol Calc (NIH): 130 mg/dL — ABNORMAL HIGH (ref 0–99)
Triglycerides: 120 mg/dL (ref 0–149)
VLDL Cholesterol Cal: 21 mg/dL (ref 5–40)

## 2023-03-11 LAB — VITAMIN D 25 HYDROXY (VIT D DEFICIENCY, FRACTURES): Vit D, 25-Hydroxy: 48.8 ng/mL (ref 30.0–100.0)

## 2023-03-12 DIAGNOSIS — Z7952 Long term (current) use of systemic steroids: Secondary | ICD-10-CM | POA: Diagnosis not present

## 2023-03-12 DIAGNOSIS — Z8262 Family history of osteoporosis: Secondary | ICD-10-CM | POA: Diagnosis not present

## 2023-03-12 DIAGNOSIS — M8588 Other specified disorders of bone density and structure, other site: Secondary | ICD-10-CM | POA: Diagnosis not present

## 2023-03-12 DIAGNOSIS — M069 Rheumatoid arthritis, unspecified: Secondary | ICD-10-CM | POA: Diagnosis not present

## 2023-03-12 NOTE — Telephone Encounter (Signed)
 Received fax from [employer] (dated 03/04/2023) requesting proof of household income from patient as well as PA approval  Called patient to discuss. She will take pictures of SS benefits statements and probably send via email  Called [employer] and they have confirmed that they have PA on file. They also pulled through patient's income being higher than the income threshold. Patient states that she and her husband only receive SS benefits  Chesley Mires, PharmD, MPH, BCPS, CPP Clinical Pharmacist (Rheumatology and Pulmonology)

## 2023-03-15 DIAGNOSIS — M8580 Other specified disorders of bone density and structure, unspecified site: Secondary | ICD-10-CM | POA: Diagnosis not present

## 2023-03-16 NOTE — Telephone Encounter (Signed)
 Received income documents from patient. Faxed to [employer]  Chesley Mires, PharmD, MPH, BCPS, CPP Clinical Pharmacist (Rheumatology and Pulmonology)

## 2023-03-18 DIAGNOSIS — L820 Inflamed seborrheic keratosis: Secondary | ICD-10-CM | POA: Diagnosis not present

## 2023-03-18 DIAGNOSIS — Z85828 Personal history of other malignant neoplasm of skin: Secondary | ICD-10-CM | POA: Diagnosis not present

## 2023-03-18 DIAGNOSIS — Z08 Encounter for follow-up examination after completed treatment for malignant neoplasm: Secondary | ICD-10-CM | POA: Diagnosis not present

## 2023-03-30 ENCOUNTER — Other Ambulatory Visit: Payer: Self-pay | Admitting: Family Medicine

## 2023-03-30 DIAGNOSIS — F339 Major depressive disorder, recurrent, unspecified: Secondary | ICD-10-CM

## 2023-03-30 DIAGNOSIS — F411 Generalized anxiety disorder: Secondary | ICD-10-CM

## 2023-04-02 NOTE — Progress Notes (Signed)
 Office Visit Note  Patient: Katie [name]             Date of Birth: June 22, 1960           MRN: [id number]             PCP: Galvin Jules, FNP Referring: Galvin Jules, FNP Visit Date: 04/14/2023   Subjective:  Follow-up (Patient states she has been out of prednisone for a week now. )   History of Present Illness: Katie [name] is a 63 y.o. female here for follow up for seronegative RA on Enbrel 50 mg subcu weekly and prednisone 5 mg daily.  Overall symptoms have been doing well with no severe flareups or exacerbation.  Currently she is off the prednisone for the past week and is noticing increased symptoms within a few days of stopping the medication.  Currently issues increased pain in the right hand, both elbows, bottoms of both feet.  Morning stiffness less than an hour in duration and not seeing visible joint swelling.  Also has 1 unrelated complaint today currently with right eye irritation started after she thinks some debris or allergen exposure against the eye when leaf blowing.   Previous HPI 01/13/2023 Katie [name] is a 63 y.o. female here for follow up for seronegative RA on Enbrel 50 mg subcu weekly and prednisone 5 mg daily.  She presents with persistent hand pain, particularly in the right hand. They report using various home remedies to manage the pain, including paraffin wax, heated gloves, and arthritis compression gloves, which provide some relief.  She worked with occupational therapy with some improvement in grip strength and function although hand pain remained about the same. The patient also notes shoulder pain, which they attribute to recent physical activity rather than their chronic condition.   In addition to their arthritis, the patient has a persistent rash in their waistline area that has been present since the summer. Despite following their doctor's advice for care, the rash continues to cause discomfort and shows signs of necrosis. The patient  plans to see a dermatologist once their new insurance coverage begins.   The patient also has a history of trigger finger in one of their fingers, which is now notably crooked and sore to touch. They report that this finger always hurts the most. Despite the pain, the patient remains active, doing chair yoga and other stretches to maintain mobility. They note that their symptoms are tolerable and do not seem to be worsening.    Previous HPI 10/12/2022 Katie [name] is a 63 y.o. female here for follow up for seronegative RA on Enbrel 50 mg subcu weekly and prednisone 5 mg daily.  He started Enbrel weekly injections first dose on July 7 initially had rash and soreness around the initial injection sites.  Subsequently treated with oral antihistamines and this particular side effect has decreased.  However she still concerned about ongoing pain and stiffness in her hands worse around the third digit typically worse after physical activity and she does stay busy cleaning several homes.  Not sure whether the Enbrel has made a big improvement in hand symptoms on top of the maintenance prednisone.  Notices benefit with heat and compression.  The rest of her joints are all doing well.   Previous HPI 04/23/2022 Katie [name] is a 63 y.o. 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 Overall symptoms are doing well with this dose but still  has some breakthrough symptoms with bilateral shoulder pain and some swelling in finger joints. She has noticed ongoing intermittent palpitation symptoms again despite remaining off Humira. Cardiac evaluation for this has been unremarkable so far no evidence of serious arrhythmia.    Previous HPI 10/10/21 Katie [name] is a 63 y.o. 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 She stopped Humira after last visit due to developing palpitations and pressure in her chest and neck after taking the medication. Since  stopping it these symptoms completely went away.  She never recalls any similar symptoms in the past and has not had any with the prednisone.   Previous HPI 05/26/2021 Katie [name] is a 63 y.o. 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 Humira new start 04/10/2021.  Symptoms slightly improved with the medication so far still has soreness in right hand MCPs main affected area. After about 3 weeks since starting the medication she has noticed an increase in symptoms she feels are similar to palpitations or a panic attack and like there is pressure over the upper part of her chest.  These come and go within a few minutes but have been occurring pretty much daily for weeks.  She has used her husband's A-fib monitor to check and not seen any obvious rhythm abnormality testing at home.  She tried taking hydroxyzine as needed as previously prescribed for her did not notice any symptom improvement just sedation.   Previous HPI 03/19/2021 Katie [name] is a 63 y.o. 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 Labs were checked at initial visit planning for trying biologic DMARD due to intolerance of oral medications. She has increased joint pain in multiple areas since stopping the low dose prednisone weeks ago. Worst in neck and shoulders and in right hand. Also started having a faint skin rash around her neck and collar area in the past week.   Previous HPI 02/26/21 Katie [name] is a 63 y.o. female here for seronegative RA. She saw Dr. Bernadine Briar in 2017-2018 tried a few medications without much success but had good response to low dose prednisone. She did not tolerate methotrexate or leflunomide with liver function changes and on methotrexate also had significant hair loss. She saw Dr. Ebbie Goldmann once for this problem but did not follow up for any long term treatment. She is currently on prednisone 5 mg daily which is controlling symptoms mostly but  has a lot of pain any time she comes off this. She has noticed some increase in forward bending of neck and also has a chronic enlarged right supraclavicular lymph node for years.  Bone densitometry from 2015 showing osteopenia t-score -1.4.   DMARD Hx Enbrel- Not effective Humira- Palpitations? MTX- LFT changes, alopecia LEF- LFT changes   Review of Systems  Constitutional:  Positive for fatigue.  HENT:  Negative for mouth sores and mouth dryness.   Eyes:  Positive for dryness.  Respiratory:  Negative for shortness of breath.   Cardiovascular:  Positive for palpitations. Negative for chest pain.  Gastrointestinal:  Positive for constipation and diarrhea. Negative for blood in stool.  Endocrine: Negative for increased urination.  Genitourinary:  Negative for involuntary urination.  Musculoskeletal:  Positive for joint pain, joint pain, myalgias, morning stiffness, muscle tenderness and myalgias. Negative for gait problem, joint swelling and muscle weakness.  Skin:  Negative for color change, rash, hair loss and sensitivity to  sunlight.  Allergic/Immunologic: Negative for susceptible to infections.  Neurological:  Negative for dizziness and headaches.  Hematological:  Negative for swollen glands.  Psychiatric/Behavioral:  Negative for depressed mood and sleep disturbance. The patient is not nervous/anxious.     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 03/19/2021   High risk medication use 02/26/2021   Decreased libido 06/02/2018   Depression, recurrent (HCC) 05/27/2018   GAD (generalized anxiety disorder) 05/27/2018   Gastroesophageal reflux disease without esophagitis 05/27/2018   RA (rheumatoid arthritis) (HCC) 05/27/2016    Past Medical History:  Diagnosis Date   Arthritis    RA   Asthma    no attack since childhood   Blood transfusion    Contact lens/glasses fitting    wears  glasses or contacts   Cystocele    Depression    External hemorrhoids    Fibromyalgia    GERD (gastroesophageal reflux disease)    past hx    Glaucoma    RIGHT EYE   HPV (human papilloma virus) infection    Neuromuscular disorder (HCC)    fibromyalgia   Syncope 2015   Pt questioned seizure with this episode - nothing like since - vaso vagal response    Wears dentures    top    Family History  Problem Relation Age of Onset   Hypertension Mother    Hypothyroidism Mother    Rheum arthritis Mother    Emphysema Father    Kidney disease Sister    Hypertension Sister    Bone cancer Daughter        Died at age 20   Celiac disease Daughter    Hypertension Daughter    Colon cancer Neg Hx    Colon polyps Neg Hx    Past Surgical History:  Procedure Laterality Date   ABDOMINAL EXPLORATION SURGERY  1984   bleed after hyst   APPENDECTOMY  1983   BLADDER SUSPENSION  2010   BREAST ENHANCEMENT SURGERY  2000   CARPAL TUNNEL RELEASE  1995   rt   CARPAL TUNNEL RELEASE Left 12/09/2012   Procedure: LEFT LIMITED OPEN CARPAL TUNNEL RELEASE;  Surgeon: Ronn Cohn, MD;  Location: [facility];  Service: Orthopedics;  Laterality: Left;   COLONOSCOPY     CYSTOSCOPY     DIAGNOSTIC LAPAROSCOPY     Medial Branch Block  02/12/2022   Medial Branch Block  03/12/2022   MINI SHUNT INSERTION  10/14/2011   Procedure: INSERTION OF MINI SHUNT;  Surgeon: Ben Bracken, MD;  Location: [facility] OR;  Service: Ophthalmology;  Laterality: Right;   POLYPECTOMY     Tubaligation  1980   VAGINAL HYSTERECTOMY  1983   Social History   Social History Narrative   Not on file   Immunization History  Administered Date(s) Administered   Influenza, Seasonal, Injecte, Preservative Fre 10/22/2015   Influenza,inj,Quad PF,6+ Mos 11/07/2018, 11/08/2020   Influenza-Unspecified 11/07/2018, 11/08/2020   Moderna Sars-Covid-2 Vaccination 05/29/2019   Pneumococcal Conjugate-13 11/10/2018   Td 02/21/2018   Tdap  01/07/2012, 02/21/2018   Zoster Recombinant(Shingrix) 05/15/2021     Objective: Vital Signs: BP 101/62 (BP Location: Left Arm, Patient Position: Sitting, Cuff Size: Normal)   Pulse 75   Resp 14   Ht 5' (1.524 m)   Wt 115 lb (52.2 kg)   BMI 22.46 kg/m    Physical Exam Eyes:     Comments: Right  eye conjunctival injection  Cardiovascular:     Rate and Rhythm: Normal rate and regular rhythm.  Pulmonary:     Effort: Pulmonary effort is normal.     Breath sounds: Normal breath sounds.  Skin:    General: Skin is warm and dry.  Neurological:     Mental Status: She is alert.  Psychiatric:        Mood and Affect: Mood normal.      Musculoskeletal Exam:  Shoulders full ROM no tenderness or swelling Elbows full ROM no tenderness or swelling Wrists full ROM no tenderness or swelling Fingers full ROM, bony widening at thumbs, right 2nd-3rd MCPs, and multiple DIPs heberdon's nodes, there is mild tenderness on palmar side proximal to right 3rd digit Knees full ROM no tenderness or swelling   Investigation: No additional findings.  Imaging: No results found.  Recent Labs: Lab Results  Component Value Date   WBC 3.9 03/10/2023   HGB 12.6 03/10/2023   PLT 163 03/10/2023   NA 145 (H) 03/10/2023   K 4.0 03/10/2023   CL 107 (H) 03/10/2023   CO2 24 03/10/2023   GLUCOSE 74 03/10/2023   BUN 14 03/10/2023   CREATININE 0.77 03/10/2023   BILITOT 0.4 03/10/2023   ALKPHOS 63 03/10/2023   AST 17 03/10/2023   ALT 15 03/10/2023   PROT 6.3 03/10/2023   ALBUMIN 4.3 03/10/2023   CALCIUM 9.7 03/10/2023   GFRAA 89 05/27/2018   QFTBGOLDPLUS NEGATIVE 04/14/2023    Speciality Comments: MTX stopped due to elevated LFTs/hairloss; leflunomide stopped due to elevated LFTs, Humira started 04/10/21  Procedures:  No procedures performed Allergies: Codeine   Assessment / Plan:     Visit Diagnoses: Rheumatoid arthritis involving both hands, unspecified whether rheumatoid factor present (HCC) -  Plan: Sedimentation rate, C-reactive protein, predniSONE (DELTASONE) 5 MG tablet, DISCONTINUED: etanercept (ENBREL SURECLICK) 50 MG/ML injection Artery disease activity well-controlled on the regimen with Enbrel and low-dose prednisone.  I do not think alternate synergistic DMARD would allow prednisone sparing given previous frequent flareups off the treatment and has significant underlying structural arthritis component. - Checking sed rate and CRP for disease activity monitoring - Continue Enbrel 50 mg subcu weekly - Continue prednisone 5 mg daily  High risk medication use - Enbrel 50 mg McDonald weekly - Plan: QuantiFERON-TB Gold Plus, DISCONTINUED: etanercept (ENBREL SURECLICK) 50 MG/ML injection Tolerating medications well without new side effects.  Recent labs reviewed including blood count and metabolic panel look fine for continuing the Enbrel. - Checking annual TB screening for medication monitoring and long-term use of TNF inhibitor  Long term (current) use of systemic steroids - prednisone 5 mg daily  Orders: Orders Placed This Encounter  Procedures   Sedimentation rate   C-reactive protein   QuantiFERON-TB Gold Plus   Meds ordered this encounter  Medications   DISCONTD: etanercept (ENBREL SURECLICK) 50 MG/ML injection    Sig: Inject 50 mg into the skin once a week.    Dispense:  12 mL    Refill:  0    Prescription Type::   Renewal   predniSONE (DELTASONE) 5 MG tablet    Sig: Take 1-2 tablets daily as needed    Dispense:  180 tablet    Refill:  0     Follow-Up Instructions: Return in about 3 months (around 07/14/2023) for RA on ENB/GC f/u 3mos.   Matt Song, MD  Note - This record has been created using [employer].  Chart creation errors have been  sought, but may not always  have been located. Such creation errors do not reflect on  the standard of medical care.

## 2023-04-09 NOTE — Telephone Encounter (Signed)
 Received a fax from  [employer] regarding an approval for ENBREL patient assistance from 04/08/2023 to 01/05/2024. Approval letter sent to scan center.  Phone #: [phone] Fax #: [phone]  Chesley Mires, PharmD, MPH, BCPS, CPP Clinical Pharmacist (Rheumatology and Pulmonology)

## 2023-04-14 ENCOUNTER — Encounter: Payer: Self-pay | Admitting: Internal Medicine

## 2023-04-14 ENCOUNTER — Ambulatory Visit: Payer: 59 | Attending: Internal Medicine | Admitting: Internal Medicine

## 2023-04-14 VITALS — BP 101/62 | HR 75 | Resp 14 | Ht 60.0 in | Wt 115.0 lb

## 2023-04-14 DIAGNOSIS — M069 Rheumatoid arthritis, unspecified: Secondary | ICD-10-CM | POA: Diagnosis not present

## 2023-04-14 DIAGNOSIS — Z7952 Long term (current) use of systemic steroids: Secondary | ICD-10-CM | POA: Diagnosis not present

## 2023-04-14 DIAGNOSIS — Z79899 Other long term (current) drug therapy: Secondary | ICD-10-CM | POA: Diagnosis not present

## 2023-04-14 MED ORDER — PREDNISONE 5 MG PO TABS: ORAL_TABLET | ORAL | 0 refills | Status: DC

## 2023-04-14 MED ORDER — ENBREL SURECLICK 50 MG/ML ~~LOC~~ SOAJ: 50.0000 mg | SUBCUTANEOUS | 0 refills | Status: DC

## 2023-04-15 ENCOUNTER — Encounter: Payer: Self-pay | Admitting: Pharmacist

## 2023-04-15 ENCOUNTER — Other Ambulatory Visit: Payer: Self-pay | Admitting: *Deleted

## 2023-04-15 ENCOUNTER — Telehealth: Payer: Self-pay | Admitting: Pharmacist

## 2023-04-15 MED ORDER — ENBREL MINI 50 MG/ML ~~LOC~~ SOCT: 50.0000 mg | SUBCUTANEOUS | Status: DC

## 2023-04-15 NOTE — Telephone Encounter (Signed)
 Medication Samples have been provided to the patient.  Drug name: Enbrel Autotouch Injector Device Qty: 1 device  LOT: [id number] Exp.Date: 08/04/2025  Dosing instructions: use to inject Enbrel cartridge once weekly  The patient has been instructed regarding the correct time, dose, and frequency of taking this medication, including desired effects and most common side effects.   Katie [name] 10:59 AM 04/15/2023

## 2023-04-15 NOTE — Telephone Encounter (Signed)
 Patient contacted the office stating she received her shipment of Enbrel Mini from [employer]. Patient does not have an Autotouch device. Patient did reach out to [employer] who advised her to contact our office to receive it. Patient advised we do have one here that she may pick up. Patient states she will have her daughter Jearld Adjutant come by to pick up. Autotouch reserved in the cabinet for patient.

## 2023-04-17 LAB — QUANTIFERON-TB GOLD PLUS
Mitogen-NIL: 8.4 [IU]/mL
NIL: 0.02 [IU]/mL
QuantiFERON-TB Gold Plus: NEGATIVE
TB1-NIL: 0.01 [IU]/mL
TB2-NIL: 0.01 [IU]/mL

## 2023-04-17 LAB — SEDIMENTATION RATE: Sed Rate: 9 mm/h (ref 0–30)

## 2023-04-17 LAB — C-REACTIVE PROTEIN: CRP: <3 mg/L (ref <8.0)

## 2023-04-21 ENCOUNTER — Telehealth: Payer: Self-pay | Admitting: Family Medicine

## 2023-04-29 ENCOUNTER — Encounter: Payer: Self-pay | Admitting: *Deleted

## 2023-06-09 DIAGNOSIS — H2513 Age-related nuclear cataract, bilateral: Secondary | ICD-10-CM | POA: Diagnosis not present

## 2023-06-09 DIAGNOSIS — H401413 Capsular glaucoma with pseudoexfoliation of lens, right eye, severe stage: Secondary | ICD-10-CM | POA: Diagnosis not present

## 2023-06-09 DIAGNOSIS — S0502XA Injury of conjunctiva and corneal abrasion without foreign body, left eye, initial encounter: Secondary | ICD-10-CM | POA: Diagnosis not present

## 2023-06-11 DIAGNOSIS — H401132 Primary open-angle glaucoma, bilateral, moderate stage: Secondary | ICD-10-CM | POA: Diagnosis not present

## 2023-06-11 DIAGNOSIS — H2513 Age-related nuclear cataract, bilateral: Secondary | ICD-10-CM | POA: Diagnosis not present

## 2023-06-30 NOTE — Progress Notes (Signed)
 Office Visit Note  Patient: Katie [name]             Date of Birth: Nov 08, 1960           MRN: [id number]             PCP: Severa Rock HERO, FNP Referring: Severa Rock HERO, FNP Visit Date: 07/14/2023   Subjective:  Follow-up   Discussed the use of AI scribe software for clinical note transcription with the patient, who gave verbal consent to proceed.  History of Present Illness   Katie [name] is a 63 y.o. female here for follow up for seronegative RA on Enbrel  50 mg subcu weekly and prednisone  5 mg daily.    She experiences joint pain and stiffness, particularly in her hands, which become stiff and sore, especially after physical activity or extensive use. One specific area in her hand remains consistently sore at the right 3rd MCP joint. She continues to take Enbrel  and 5 mg of prednisone  consistently without issues. Morning stiffness lasting for several minutes.  She reports persistent tiredness and sleep disturbances, attributing some of her sleep issues to her dogs and her husband's condition, as he has Parkinson's disease and experiences disruptive sleep behaviors.  She mentions long-standing hip pain, describing soreness in her hips, particularly after sitting with her legs crossed for prolonged time due to her dogs' positioning. No new or acute soreness in other areas.     Previous HPI 04/14/2023 Katie [name] is a 63 y.o. female here for follow up for seronegative RA on Enbrel  50 mg subcu weekly and prednisone  5 mg daily.  Overall symptoms have been doing well with no severe flareups or exacerbation.  Currently she is off the prednisone  for the past week and is noticing increased symptoms within a few days of stopping the medication.  Currently issues increased pain in the right hand, both elbows, bottoms of both feet.  Morning stiffness less than an hour in duration and not seeing visible joint swelling.  Also has 1 unrelated complaint today currently with right eye  irritation started after she thinks some debris or allergen exposure against the eye when leaf blowing.     Previous HPI 01/13/2023 Katie [name] is a 63 y.o. female here for follow up for seronegative RA on Enbrel  50 mg subcu weekly and prednisone  5 mg daily.  She presents with persistent hand pain, particularly in the right hand. They report using various home remedies to manage the pain, including paraffin wax, heated gloves, and arthritis compression gloves, which provide some relief.  She worked with occupational therapy with some improvement in grip strength and function although hand pain remained about the same. The patient also notes shoulder pain, which they attribute to recent physical activity rather than their chronic condition.   In addition to their arthritis, the patient has a persistent rash in their waistline area that has been present since the summer. Despite following their doctor's advice for care, the rash continues to cause discomfort and shows signs of necrosis. The patient plans to see a dermatologist once their new insurance coverage begins.   The patient also has a history of trigger finger in one of their fingers, which is now notably crooked and sore to touch. They report that this finger always hurts the most. Despite the pain, the patient remains active, doing chair yoga and other stretches to maintain mobility. They note that their symptoms are tolerable and do not seem to be worsening.  Previous HPI 10/12/2022 Katie [name] is a 63 y.o. female here for follow up for seronegative RA on Enbrel  50 mg subcu weekly and prednisone  5 mg daily.  He started Enbrel  weekly injections first dose on July 7 initially had rash and soreness around the initial injection sites.  Subsequently treated with oral antihistamines and this particular side effect has decreased.  However she still concerned about ongoing pain and stiffness in her hands worse around the third digit  typically worse after physical activity and she does stay busy cleaning several homes.  Not sure whether the Enbrel  has made a big improvement in hand symptoms on top of the maintenance prednisone .  Notices benefit with heat and compression.  The rest of her joints are all doing well.   Previous HPI 04/23/2022 Katie [name] is a 63 y.o. female here for follow up for seronegative RA on prednisone  5 mg daily. Overall symptoms are doing well with this dose but still has some breakthrough symptoms with bilateral shoulder pain and some swelling in finger joints. She has noticed ongoing intermittent palpitation symptoms again despite remaining off Humira . Cardiac evaluation for this has been unremarkable so far no evidence of serious arrhythmia.    Previous HPI 10/10/21 Katie [name] is a 63 y.o. female here for follow up for seronegative RA on prednisone  5 mg daily. She stopped Humira  after last visit due to developing palpitations and pressure in her chest and neck after taking the medication. Since stopping it these symptoms completely went away.  She never recalls any similar symptoms in the past and has not had any with the prednisone .   Previous HPI 05/26/2021 Katie [name] is a 63 y.o. female here for follow up for seronegative RA on prednisone  5 mg daily. Humira  new start 04/10/2021.  Symptoms slightly improved with the medication so far still has soreness in right hand MCPs main affected area. After about 3 weeks since starting the medication she has noticed an increase in symptoms she feels are similar to palpitations or a panic attack and like there is pressure over the upper part of her chest.  These come and go within a few minutes but have been occurring pretty much daily for weeks.  She has used her husband's A-fib monitor to check and not seen any obvious rhythm abnormality testing at home.  She tried taking hydroxyzine  as needed as previously prescribed for her did not notice any  symptom improvement just sedation.   Previous HPI 03/19/2021 Katie [name] is a 63 y.o. female here for follow up for seronegative RA on prednisone  5 mg daily. Labs were checked at initial visit planning for trying biologic DMARD due to intolerance of oral medications. She has increased joint pain in multiple areas since stopping the low dose prednisone  weeks ago. Worst in neck and shoulders and in right hand. Also started having a faint skin rash around her neck and collar area in the past week.   Previous HPI 02/26/21 MARTHENA WHITMYER is a 63 y.o. female here for seronegative RA. She saw Dr. Everlean in 2017-2018 tried a few medications without much success but had good response to low dose prednisone . She did not tolerate methotrexate or leflunomide with liver function changes and on methotrexate also had significant hair loss. She saw Dr. Mai once for this problem but did not follow up for any long term treatment. She is currently on prednisone  5 mg daily which is controlling symptoms mostly but has a lot  of pain any time she comes off this. She has noticed some increase in forward bending of neck and also has a chronic enlarged right supraclavicular lymph node for years.  Bone densitometry from 2015 showing osteopenia t-score -1.4.   DMARD Hx Enbrel - Not effective Humira - Palpitations? MTX- LFT changes, alopecia LEF- LFT changes   Review of Systems  Constitutional:  Positive for fatigue.  HENT:  Positive for mouth dryness. Negative for mouth sores.   Eyes:  Positive for dryness.  Respiratory:  Negative for shortness of breath.   Cardiovascular:  Positive for palpitations. Negative for chest pain.  Gastrointestinal:  Negative for blood in stool, constipation and diarrhea.  Endocrine: Negative for increased urination.  Genitourinary:  Negative for involuntary urination.  Musculoskeletal:  Positive for joint pain, joint pain, myalgias, morning stiffness and myalgias. Negative for  gait problem, joint swelling, muscle weakness and muscle tenderness.  Skin:  Negative for color change, rash, hair loss and sensitivity to sunlight.  Allergic/Immunologic: Negative for susceptible to infections.  Neurological:  Positive for headaches. Negative for dizziness.  Hematological:  Negative for swollen glands.  Psychiatric/Behavioral:  Positive for depressed mood. Negative for sleep disturbance. The patient is nervous/anxious.     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 03/19/2021   High risk medication use 02/26/2021   Decreased libido 06/02/2018   Depression, recurrent (HCC) 05/27/2018   GAD (generalized anxiety disorder) 05/27/2018   Gastroesophageal reflux disease without esophagitis 05/27/2018   RA (rheumatoid arthritis) (HCC) 05/27/2016    Past Medical History:  Diagnosis Date   Arthritis    RA   Asthma    no attack since childhood   Blood transfusion    Contact lens/glasses fitting    wears glasses or contacts   Cystocele    Depression    External hemorrhoids    Fibromyalgia    GERD (gastroesophageal reflux disease)    past hx    Glaucoma    RIGHT EYE   HPV (human papilloma virus) infection    Neuromuscular disorder (HCC)    fibromyalgia   Syncope 2015   Pt questioned seizure with this episode - nothing like since - vaso vagal response    Wears dentures    top    Family History  Problem Relation Age of Onset   Hypertension Mother    Hypothyroidism Mother    Rheum arthritis Mother    Emphysema Father    Kidney disease Sister    Hypertension Sister    Bone cancer Daughter        Died at age 54   Celiac disease Daughter    Hypertension Daughter    Colon cancer Neg Hx    Colon polyps Neg Hx    Past Surgical History:  Procedure Laterality Date   ABDOMINAL EXPLORATION SURGERY  1984   bleed after hyst   APPENDECTOMY  1983   BLADDER SUSPENSION  2010    BREAST ENHANCEMENT SURGERY  2000   CARPAL TUNNEL RELEASE  1995   rt   CARPAL TUNNEL RELEASE Left 12/09/2012   Procedure: LEFT LIMITED OPEN CARPAL TUNNEL RELEASE;  Surgeon: Elsie Mussel, MD;  Location: [facility];  Service: Orthopedics;  Laterality: Left;   COLONOSCOPY     CYSTOSCOPY     DIAGNOSTIC LAPAROSCOPY     Medial Branch Block  02/12/2022   Medial Branch Block  03/12/2022   MINI  SHUNT INSERTION  10/14/2011   Procedure: INSERTION OF MINI SHUNT;  Surgeon: Gaither Quan, MD;  Location: [facility] OR;  Service: Ophthalmology;  Laterality: Right;   POLYPECTOMY     Tubaligation  1980   VAGINAL HYSTERECTOMY  1983   Social History   Social History Narrative   Not on file   Immunization History  Administered Date(s) Administered   Influenza, Seasonal, Injecte, Preservative Fre 10/22/2015   Influenza,inj,Quad PF,6+ Mos 11/07/2018, 11/08/2020   Influenza-Unspecified 11/07/2018, 11/08/2020   Moderna Sars-Covid-2 Vaccination 05/29/2019   Pneumococcal Conjugate-13 11/10/2018   Td 02/21/2018   Tdap 01/07/2012, 02/21/2018   Zoster Recombinant(Shingrix) 05/15/2021     Objective: Vital Signs: BP (!) 92/53 (BP Location: Left Arm, Patient Position: Sitting, Cuff Size: Normal)   Pulse (!) 59   Resp 14   Ht 5' (1.524 m)   Wt 118 lb (53.5 kg)   BMI 23.05 kg/m    Physical Exam Eyes:     Comments: Right eye blind, obscuring cataract present  Cardiovascular:     Rate and Rhythm: Normal rate and regular rhythm.  Pulmonary:     Effort: Pulmonary effort is normal.     Breath sounds: Normal breath sounds.  Musculoskeletal:     Right lower leg: No edema.     Left lower leg: No edema.  Lymphadenopathy:     Cervical: No cervical adenopathy.  Skin:    General: Skin is warm and dry.     Findings: No rash.  Neurological:     Mental Status: She is alert.  Psychiatric:        Mood and Affect: Mood normal.      Musculoskeletal Exam:  Shoulders full ROM no tenderness or  swelling Elbows full ROM no tenderness or swelling Wrists full ROM no tenderness or swelling Fingers full ROM b/l, bony widening at thumbs, right 2nd-3rd MCPs, and multiple DIPs heberdon's nodes, there is mild tenderness on palmar side proximal to right 3rd digit Knees full ROM no tenderness or swelling Ankles full ROM no tenderness or swelling  Investigation: No additional findings.  Imaging: No results found.  Recent Labs: Lab Results  Component Value Date   WBC 3.9 03/10/2023   HGB 12.6 03/10/2023   PLT 163 03/10/2023   NA 145 (H) 03/10/2023   K 4.0 03/10/2023   CL 107 (H) 03/10/2023   CO2 24 03/10/2023   GLUCOSE 74 03/10/2023   BUN 14 03/10/2023   CREATININE 0.77 03/10/2023   BILITOT 0.4 03/10/2023   ALKPHOS 63 03/10/2023   AST 17 03/10/2023   ALT 15 03/10/2023   PROT 6.3 03/10/2023   ALBUMIN 4.3 03/10/2023   CALCIUM 9.7 03/10/2023   GFRAA 89 05/27/2018   QFTBGOLDPLUS NEGATIVE 04/14/2023    Speciality Comments: MTX stopped due to elevated LFTs/hairloss; leflunomide stopped due to elevated LFTs, Humira  started 04/10/21  Procedures:  No procedures performed Allergies: Codeine   Assessment / Plan:     Visit Diagnoses: Rheumatoid arthritis involving both hands, unspecified whether rheumatoid factor present (HCC) - Plan: Sedimentation rate, predniSONE  (DELTASONE ) 5 MG tablet Rheumatoid arthritis appears well-controlled on Enbrel  and prednisone . No major flare-ups since last visit.  Current complaints.  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  No peripheral joint synovitis appreciable on exam. -Checking sed rate for disease activity monitoring - Continue Enbrel  50 mg subcu weekly. - Continue prednisone  5 mg daily.  High risk medication use - Enbrel  50 mg subcu weekly - Plan: CBC with Differential/Platelet, Comprehensive metabolic panel with GFR  Has been tolerating medications well without any complaints to pills or injections.  No serious interval  infections.  Previous annual QuantiFERON screening reviewed was negative. 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  Also having increased pain around the right eye for several months.  She has had recommendation for either cataract surgery to allow visualization of the posterior eye or to have complete resection of this blind eye from her ophthalmologist.  Chronic hip pain Chronic hip pain likely due to prolonged sitting with legs spread. IT band tightness may contribute to discomfort. - Recommend IT band stretches, including quad and leg cross stretches.       Orders: Orders Placed This Encounter  Procedures   Sedimentation rate   CBC with Differential/Platelet   Comprehensive metabolic panel with GFR   Meds ordered this encounter  Medications   predniSONE  (DELTASONE ) 5 MG tablet    Sig: Take 1-2 tablets daily as needed    Dispense:  180 tablet    Refill:  0     Follow-Up Instructions: Return in about 3 months (around 10/14/2023) for RA on ENB/GC f/u 3mos.   Lonni LELON Ester, MD  Note - This record has been created using [employer].  Chart creation errors have been sought, but may not always  have been located. Such creation errors do not reflect on  the standard of medical care.

## 2023-07-05 DIAGNOSIS — H401132 Primary open-angle glaucoma, bilateral, moderate stage: Secondary | ICD-10-CM | POA: Diagnosis not present

## 2023-07-05 DIAGNOSIS — H2513 Age-related nuclear cataract, bilateral: Secondary | ICD-10-CM | POA: Diagnosis not present

## 2023-07-10 ENCOUNTER — Other Ambulatory Visit: Payer: Self-pay | Admitting: Internal Medicine

## 2023-07-10 DIAGNOSIS — M069 Rheumatoid arthritis, unspecified: Secondary | ICD-10-CM

## 2023-07-12 NOTE — Telephone Encounter (Signed)
 Last Fill: 04/14/2023  Next Visit: 07/14/2023  Last Visit: 04/14/2023  Dx: Rheumatoid arthritis involving both hands, unspecified whether rheumatoid factor present   Current Dose per office note on 04/14/2023: Continue prednisone  5 mg daily   Okay to refill Prednisone ?

## 2023-07-14 ENCOUNTER — Telehealth: Payer: Self-pay | Admitting: Pharmacist

## 2023-07-14 ENCOUNTER — Ambulatory Visit: Attending: Internal Medicine | Admitting: Internal Medicine

## 2023-07-14 ENCOUNTER — Encounter: Payer: Self-pay | Admitting: Internal Medicine

## 2023-07-14 VITALS — BP 92/53 | HR 59 | Resp 14 | Ht 60.0 in | Wt 118.0 lb

## 2023-07-14 DIAGNOSIS — Z7952 Long term (current) use of systemic steroids: Secondary | ICD-10-CM | POA: Diagnosis not present

## 2023-07-14 DIAGNOSIS — Z79899 Other long term (current) drug therapy: Secondary | ICD-10-CM | POA: Insufficient documentation

## 2023-07-14 DIAGNOSIS — H5440 Blindness, one eye, unspecified eye: Secondary | ICD-10-CM | POA: Diagnosis not present

## 2023-07-14 DIAGNOSIS — M069 Rheumatoid arthritis, unspecified: Secondary | ICD-10-CM | POA: Insufficient documentation

## 2023-07-14 LAB — COMPREHENSIVE METABOLIC PANEL WITH GFR
AG Ratio: 2 (calc) (ref 1.0–2.5)
ALT: 15 U/L (ref 6–29)
AST: 17 U/L (ref 10–35)
Albumin: 4.2 g/dL (ref 3.6–5.1)
Alkaline phosphatase (APISO): 57 U/L (ref 37–153)
BUN/Creatinine Ratio: 11 (calc) (ref 6–22)
BUN: 12 mg/dL (ref 7–25)
CO2: 29 mmol/L (ref 20–32)
Calcium: 9.7 mg/dL (ref 8.6–10.4)
Chloride: 105 mmol/L (ref 98–110)
Creat: 1.11 mg/dL — ABNORMAL HIGH (ref 0.50–1.05)
Globulin: 2.1 g/dL (ref 1.9–3.7)
Glucose, Bld: 104 mg/dL — ABNORMAL HIGH (ref 65–99)
Potassium: 4.1 mmol/L (ref 3.5–5.3)
Sodium: 141 mmol/L (ref 135–146)
Total Bilirubin: 0.4 mg/dL (ref 0.2–1.2)
Total Protein: 6.3 g/dL (ref 6.1–8.1)
eGFR: 55 mL/min/1.73m2 — ABNORMAL LOW (ref >=60)

## 2023-07-14 LAB — CBC WITH DIFFERENTIAL/PLATELET
Absolute Lymphocytes: 1088 {cells}/uL (ref 850–3900)
Absolute Monocytes: 271 {cells}/uL (ref 200–950)
Basophils Absolute: 30 {cells}/uL (ref 0–200)
Basophils Relative: 0.7 %
Eosinophils Absolute: 69 {cells}/uL (ref 15–500)
Eosinophils Relative: 1.6 %
HCT: 38.4 % (ref 35.0–45.0)
Hemoglobin: 12.6 g/dL (ref 11.7–15.5)
MCH: 30.8 pg (ref 27.0–33.0)
MCHC: 32.8 g/dL (ref 32.0–36.0)
MCV: 93.9 fL (ref 80.0–100.0)
MPV: 11.3 fL (ref 7.5–12.5)
Monocytes Relative: 6.3 %
Neutro Abs: 2842 {cells}/uL (ref 1500–7800)
Neutrophils Relative %: 66.1 %
Platelets: 157 Thousand/uL (ref 140–400)
RBC: 4.09 Million/uL (ref 3.80–5.10)
RDW: 13 % (ref 11.0–15.0)
Total Lymphocyte: 25.3 %
WBC: 4.3 Thousand/uL (ref 3.8–10.8)

## 2023-07-14 LAB — SEDIMENTATION RATE: Sed Rate: 2 mm/h (ref 0–30)

## 2023-07-14 MED ORDER — ENBREL MINI 50 MG/ML ~~LOC~~ SOCT: 50.0000 mg | SUBCUTANEOUS | 0 refills | Status: DC

## 2023-07-14 MED ORDER — PREDNISONE 5 MG PO TABS: ORAL_TABLET | ORAL | 0 refills | Status: DC

## 2023-07-14 NOTE — Telephone Encounter (Addendum)
 Rx for Enbrel  sent to [facility]  ----- Message from [name] sent at 07/14/2023  2:33 PM EDT ----- Katie [name] reports needing a new refill of her Enbrel .  I do not see where we are sending this prescription for her refills based on your last note it looks like she is qualifying for manufacturer assistance.  Would you have paperwork for this or is there a specialty pharmacy that I should send this to electronically? Thanks.

## 2023-08-02 DIAGNOSIS — H268 Other specified cataract: Secondary | ICD-10-CM | POA: Diagnosis not present

## 2023-08-02 DIAGNOSIS — H401421 Capsular glaucoma with pseudoexfoliation of lens, left eye, mild stage: Secondary | ICD-10-CM | POA: Diagnosis not present

## 2023-08-02 DIAGNOSIS — H40003 Preglaucoma, unspecified, bilateral: Secondary | ICD-10-CM | POA: Diagnosis not present

## 2023-08-02 DIAGNOSIS — H401413 Capsular glaucoma with pseudoexfoliation of lens, right eye, severe stage: Secondary | ICD-10-CM | POA: Diagnosis not present

## 2023-08-02 DIAGNOSIS — H44431 Hypotony of eye due to other ocular disorders, right eye: Secondary | ICD-10-CM | POA: Diagnosis not present

## 2023-08-31 ENCOUNTER — Encounter: Payer: Self-pay | Admitting: *Deleted

## 2023-09-05 ENCOUNTER — Other Ambulatory Visit: Payer: Self-pay | Admitting: Family Medicine

## 2023-09-05 DIAGNOSIS — K219 Gastro-esophageal reflux disease without esophagitis: Secondary | ICD-10-CM

## 2023-09-10 ENCOUNTER — Encounter: Admitting: Family Medicine

## 2023-09-13 ENCOUNTER — Telehealth: Payer: Self-pay | Admitting: Internal Medicine

## 2023-09-13 DIAGNOSIS — M069 Rheumatoid arthritis, unspecified: Secondary | ICD-10-CM

## 2023-09-13 MED ORDER — METHYLPREDNISOLONE 4 MG PO TBPK: ORAL_TABLET | ORAL | 0 refills | Status: DC

## 2023-09-13 NOTE — Telephone Encounter (Signed)
 Contacted patient she is having pain in her hands hips , knees , feet, and shoulders pretty much from head to toe. States its getting worse she is taking her shots as well as her prednisone , she is also taking ibuprofen as well but its not helping and she doesn't want to keep taking it, patient states she is doing her daily walks but its becoming very uncomfortable to even do that. Please advise.

## 2023-09-13 NOTE — Addendum Note (Signed)
 Addended by: JEANNETTA LONNI ORN on: 09/13/2023 02:27 PM   Modules accepted: Orders

## 2023-09-13 NOTE — Telephone Encounter (Signed)
 Patient is in agreement to increase the steroid dose short term. Patient states at her previous rheumatologist, she was prescribed a medrol  dose pack when she would experience these symptoms, then after completing the dose pack- she would resume her regular prednisone  dose.  Patient is currently taking prednisone  5mg  daily.   What dose would you like the patient to increase to?  Patient uses [facility] in [place].

## 2023-09-13 NOTE — Telephone Encounter (Signed)
 If she agrees we could try a short increase in her steroid dose to see if that improves symptoms.  I am not sure what else to recommend short term to try for widespread increased pain. Her last test results showed a small decrease in GFR and combined with her history of GI irritation I agree with not taking a lot of ibuprofen long term.

## 2023-09-13 NOTE — Telephone Encounter (Signed)
 Patient states she may be having a flare up but is having pain in her joints. Pt would like to know if there is anything she could do for this pain. Pt states its hard for her to do much right now.

## 2023-09-13 NOTE — Telephone Encounter (Signed)
 Patient advised Dr. Jeannetta sent Rx for medrol  dosepak x6 days. She should hold her regular prednisone  while taking this and can resume afterwards.

## 2023-09-13 NOTE — Telephone Encounter (Signed)
 Sent Rx for medrol  dosepak x6 days. She should hold her regular prednisone  while taking this and can resume afterwards.

## 2023-09-20 DIAGNOSIS — H268 Other specified cataract: Secondary | ICD-10-CM | POA: Diagnosis not present

## 2023-09-20 DIAGNOSIS — H401413 Capsular glaucoma with pseudoexfoliation of lens, right eye, severe stage: Secondary | ICD-10-CM | POA: Diagnosis not present

## 2023-09-20 DIAGNOSIS — H25813 Combined forms of age-related cataract, bilateral: Secondary | ICD-10-CM | POA: Diagnosis not present

## 2023-09-20 DIAGNOSIS — H401421 Capsular glaucoma with pseudoexfoliation of lens, left eye, mild stage: Secondary | ICD-10-CM | POA: Diagnosis not present

## 2023-09-22 ENCOUNTER — Encounter: Admitting: Family Medicine

## 2023-09-30 ENCOUNTER — Encounter: Payer: Self-pay | Admitting: Family Medicine

## 2023-09-30 ENCOUNTER — Ambulatory Visit (INDEPENDENT_AMBULATORY_CARE_PROVIDER_SITE_OTHER): Admitting: Family Medicine

## 2023-09-30 VITALS — BP 108/64 | HR 63 | Temp 97.0°F | Ht 61.0 in | Wt 117.4 lb

## 2023-09-30 DIAGNOSIS — H5440 Blindness, one eye, unspecified eye: Secondary | ICD-10-CM | POA: Diagnosis not present

## 2023-09-30 DIAGNOSIS — K219 Gastro-esophageal reflux disease without esophagitis: Secondary | ICD-10-CM

## 2023-09-30 DIAGNOSIS — M05741 Rheumatoid arthritis with rheumatoid factor of right hand without organ or systems involvement: Secondary | ICD-10-CM | POA: Diagnosis not present

## 2023-09-30 DIAGNOSIS — Z0001 Encounter for general adult medical examination with abnormal findings: Secondary | ICD-10-CM | POA: Diagnosis not present

## 2023-09-30 DIAGNOSIS — Z23 Encounter for immunization: Secondary | ICD-10-CM

## 2023-09-30 DIAGNOSIS — Z0000 Encounter for general adult medical examination without abnormal findings: Secondary | ICD-10-CM

## 2023-09-30 DIAGNOSIS — Z1329 Encounter for screening for other suspected endocrine disorder: Secondary | ICD-10-CM | POA: Diagnosis not present

## 2023-09-30 DIAGNOSIS — F411 Generalized anxiety disorder: Secondary | ICD-10-CM

## 2023-09-30 DIAGNOSIS — F339 Major depressive disorder, recurrent, unspecified: Secondary | ICD-10-CM

## 2023-09-30 DIAGNOSIS — M722 Plantar fascial fibromatosis: Secondary | ICD-10-CM

## 2023-09-30 DIAGNOSIS — M05742 Rheumatoid arthritis with rheumatoid factor of left hand without organ or systems involvement: Secondary | ICD-10-CM | POA: Diagnosis not present

## 2023-09-30 DIAGNOSIS — Z130 Encounter for screening for diseases of the blood and blood-forming organs and certain disorders involving the immune mechanism: Secondary | ICD-10-CM

## 2023-09-30 DIAGNOSIS — Z1211 Encounter for screening for malignant neoplasm of colon: Secondary | ICD-10-CM

## 2023-09-30 DIAGNOSIS — Z136 Encounter for screening for cardiovascular disorders: Secondary | ICD-10-CM

## 2023-09-30 MED ORDER — ESCITALOPRAM OXALATE 10 MG PO TABS: 10.0000 mg | ORAL_TABLET | Freq: Every day | ORAL | 1 refills | Status: AC

## 2023-09-30 NOTE — Progress Notes (Signed)
 Complete physical exam  Patient: Katie [name]   DOB: September 09, 1960   63 y.o. Female  MRN: [id number]  Subjective:    Chief Complaint  Patient presents with   Annual Exam   Foot Pain    Right foot pain but heel pain is worse x 5 days    Katie [name] is a 63 y.o. female who presents today for a complete physical exam. She reports consuming a general diet. The patient does not participate in regular exercise at present. She generally feels well. She reports sleeping well. She does have additional problems to discuss today.    She experiences heel pain in the right foot, which is sore and worsens upon initial movement or walking but improves with continued walking. She has not tried specific treatments but finds relief when walking on uneven terrain, such as trails. The pain was aggravated by pressing down on mole traps with her heel, leading to increased discomfort over a weekend.  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 Four specialists have evaluated her, offering differing opinions on the feasibility and risks of surgery. She reports that her doctors have told her the cataract is so severe that they cannot see behind it to assess the condition of her eye. She uses drops for inflammation and pain, and previously used drops for pressure control, which were stopped when the pressure was zero but later increased to thirty.  She has a history of rheumatoid arthritis and is under the care of a rheumatologist, whom she sees every three months. Her regimen includes prednisone  and Enbrel , with no recent changes. She experienced a possible RA flare a few weeks ago, characterized by severe body aches and joint pain, similar to flu symptoms. A short course of prednisone  alleviated these symptoms.  She takes Lexapro  and occasionally uses Buspar  for anxiety, which she uses sparingly. Lexapro  helps maintain her  mood stability.  She has a history of colonoscopy with two small polyps found and has used Cologuard for screening since then. She is concerned about procedures requiring anesthesia due to her husband's Parkinson's disease, which has progressed to the point where he is stumbling and has difficulty with mobility. She manages his care and is cautious about leaving him alone.  No recent changes in bowel or bladder habits, hearing, or significant weight changes. She occasionally uses Aleve  D for allergies, particularly when working outside. She has received her flu shot and is up to date with vaccinations, including shingles, which she received at [facility]. No chest pain, shortness of breath, swelling in legs or feet, or recent illnesses aside from the RA flare. No changes in diet, appetite, or significant night sweats.      Most recent fall risk assessment:    09/30/2023   11:48 AM  Fall Risk   Falls in the past year? 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 03/19/2021   High risk medication use 02/26/2021   Decreased  libido 06/02/2018   Depression, recurrent 05/27/2018   GAD (generalized anxiety disorder) 05/27/2018   Gastroesophageal reflux disease without esophagitis 05/27/2018   RA (rheumatoid arthritis) (HCC) 05/27/2016   Past Medical History:  Diagnosis Date   Arthritis    RA   Asthma    no attack since childhood   Blood transfusion    Contact lens/glasses fitting    wears glasses or contacts   Cystocele    Depression    External hemorrhoids    Fibromyalgia    GERD (gastroesophageal reflux disease)    past hx     Glaucoma    RIGHT EYE   HPV (human papilloma virus) infection    Neuromuscular disorder (HCC)    fibromyalgia   Syncope 2015   Pt questioned seizure with this episode - nothing like since - vaso vagal response    Wears dentures    top   Past Surgical History:  Procedure Laterality Date   ABDOMINAL EXPLORATION SURGERY  1984   bleed after hyst   APPENDECTOMY  1983   BLADDER SUSPENSION  2010   BREAST ENHANCEMENT SURGERY  2000   CARPAL TUNNEL RELEASE  1995   rt   CARPAL TUNNEL RELEASE Left 12/09/2012   Procedure: LEFT LIMITED OPEN CARPAL TUNNEL RELEASE;  Surgeon: Elsie Mussel, MD;  Location: [facility];  Service: Orthopedics;  Laterality: Left;   COLONOSCOPY     CYSTOSCOPY     DIAGNOSTIC LAPAROSCOPY     Medial Branch Block  02/12/2022   Medial Branch Block  03/12/2022   MINI SHUNT INSERTION  10/14/2011   Procedure: INSERTION OF MINI SHUNT;  Surgeon: Gaither Quan, MD;  Location: [facility];  Service: Ophthalmology;  Laterality: Right;   POLYPECTOMY     Tubaligation  1980   VAGINAL HYSTERECTOMY  1983   Social History   Tobacco Use   Smoking status: Former    Current packs/day: 0.00    Average packs/day: 0.3 packs/day for 2.0 years (0.5 ttl pk-yrs)    Types: Cigarettes    Start date: 10/12/1991    Quit date: 10/11/1993    Years since quitting: 29.9    Passive exposure: Never   Smokeless tobacco: Never  Vaping Use   Vaping status: Never Used  Substance Use Topics   Alcohol use: Not Currently   Drug use: No   Social History   Socioeconomic History   Marital status: Married    Spouse name: Not on file   Number of children: 1   Years of education: Not on file   Highest education level: Not on file  Occupational History    Employer: [employer]  Tobacco Use   Smoking status: Former    Current packs/day: 0.00    Average packs/day: 0.3 packs/day for 2.0 years (0.5 ttl pk-yrs)    Types: Cigarettes    Start date: 10/12/1991    Quit date: 10/11/1993     Years since quitting: 29.9    Passive exposure: Never   Smokeless tobacco: Never  Vaping Use   Vaping status: Never Used  Substance and Sexual Activity   Alcohol use: Not Currently   Drug use: No   Sexual activity: Not on file  Other Topics Concern   Not on file  Social History Narrative   Not on file   Social Drivers of Health   Financial Resource Strain: Not on file  Food Insecurity: Not on file  Transportation Needs: Not on file  Physical Activity: Not on file  Stress: Not on file  Social Connections: Not on file  Intimate Partner Violence: Not on file   Family Status  Relation Name Status   Mother  Alive   Father  Deceased   Sister  Alive   Sister  Alive   Brother  Alive   Daughter  Deceased   Daughter  Alive   Neg Hx  (Not Specified)  No partnership data on file   Family History  Problem Relation Age of Onset   Hypertension Mother    Hypothyroidism Mother    Rheum arthritis Mother    Emphysema Father    Kidney disease Sister    Hypertension Sister    Bone cancer Daughter        Died at age 63   Celiac disease Daughter    Hypertension Daughter    Colon cancer Neg Hx    Colon polyps Neg Hx    Allergies  Allergen Reactions   Codeine Nausea And Vomiting    Patient Care Team: Severa Rock HERO, FNP as PCP - General (Family Medicine)   Outpatient Medications Prior to Visit  Medication Sig   acetaminophen  (TYLENOL ) 500 MG tablet Take 500 mg by mouth every 6 (six) hours as needed.   alendronate (FOSAMAX) 70 MG tablet Take 1 tablet by mouth once a week.   Ascorbic Acid (VITAMIN C ADULT GUMMIES PO)    Aspirin 81 MG CAPS Aspirin 81 mg   brimonidine (ALPHAGAN) 0.2 % ophthalmic solution 1 drop 2 (two) times daily.   busPIRone  (BUSPAR ) 10 MG tablet TAKE 1 TABLET BY MOUTH TWICE A DAY   calcium carbonate 1250 MG capsule Take 1,250 mg by mouth 2 (two) times daily with a meal.   Cyanocobalamin (B-12 PO) Take by mouth.   dorzolamide-timolol (COSOPT) 2-0.5 %  ophthalmic solution 1 drop 2 (two) times daily.   etanercept  (ENBREL  MINI) 50 MG/ML injection Inject 1 mL (50 mg total) into the skin once a week.   folic acid (FOLVITE) 800 MCG tablet Take 400 mcg by mouth daily.   ibuprofen (ADVIL) 200 MG tablet Take 200 mg by mouth every 6 (six) hours as needed.   MAGNESIUM PO Take by mouth 3 times/day as needed-between meals & bedtime.   Multiple Vitamin (MULTIVITAMIN) tablet Take 1 tablet by mouth daily.     omeprazole  (PRILOSEC) 20 MG capsule Take 1 capsule (20 mg total) by mouth 2 (two) times daily before a meal.   ondansetron  (ZOFRAN ) 4 MG tablet Take 1 tablet (4 mg total) by mouth every 8 (eight) hours as needed for nausea or vomiting.   predniSONE  (DELTASONE ) 5 MG tablet Take 1-2 tablets daily as needed   PROAIR  HFA 108 (90 Base) MCG/ACT inhaler TAKE 2 PUFFS BY MOUTH EVERY 6 HOURS AS NEEDED FOR WHEEZE OR SHORTNESS OF BREATH   Probiotic Product (PROBIOTIC DAILY PO) Take by mouth daily.   TURMERIC PO Take by mouth.   VITAMIN A PO Take by mouth.   VITAMIN E PO Take by mouth.   [DISCONTINUED] escitalopram  (LEXAPRO ) 10 MG tablet Take 1 tablet (10 mg total) by mouth daily.   [DISCONTINUED] methylPREDNISolone  (MEDROL  DOSEPAK) 4 MG TBPK tablet Take tablets by mouth once daily with food: decreasing 6, 5, 4, 3, 2, then 1 tablet daily   No facility-administered medications prior to visit.    ROS per HPI      Objective:     BP 108/64   Pulse 63   Temp (!) 97 F (36.1 C)  Ht 5' 1 (1.549 m)   Wt 117 lb 6.4 oz (53.3 kg)   SpO2 96%   BMI 22.18 kg/m  BP Readings from Last 3 Encounters:  09/30/23 108/64  07/14/23 (!) 92/53  04/14/23 101/62   Wt Readings from Last 3 Encounters:  09/30/23 117 lb 6.4 oz (53.3 kg)  07/14/23 118 lb (53.5 kg)  04/14/23 115 lb (52.2 kg)   SpO2 Readings from Last 3 Encounters:  09/30/23 96%  03/10/23 97%  11/24/22 95%      Physical Exam Vitals and nursing note reviewed.  Constitutional:      General: She is  not in acute distress.    Appearance: Normal appearance. She is well-developed, well-groomed and normal weight. She is not ill-appearing, toxic-appearing or diaphoretic.  HENT:     Head: Normocephalic and atraumatic.     Jaw: There is normal jaw occlusion.     Right Ear: Hearing, tympanic membrane, ear canal and external ear normal.     Left Ear: Hearing, tympanic membrane, ear canal and external ear normal.     Nose: Nose normal.     Mouth/Throat:     Lips: Pink.     Mouth: Mucous membranes are moist.     Pharynx: Oropharynx is clear. Uvula midline.     Comments: Posterior oropharynx cobblestoning  Eyes:     General: Lids are normal.     Comments: Right eye opacity and blindness - chronic  Neck:     Thyroid : No thyroid  mass, thyromegaly or thyroid  tenderness.     Vascular: No carotid bruit or JVD.     Trachea: Trachea and phonation normal.  Cardiovascular:     Rate and Rhythm: Normal rate and regular rhythm.     Chest Wall: PMI is not displaced.     Pulses: Normal pulses.     Heart sounds: Normal heart sounds. No murmur heard.    No friction rub. No gallop.  Pulmonary:     Effort: Pulmonary effort is normal. No respiratory distress.     Breath sounds: Normal breath sounds. No wheezing.  Abdominal:     General: Bowel sounds are normal. There is no distension or abdominal bruit.     Palpations: Abdomen is soft. There is no hepatomegaly or splenomegaly.     Tenderness: There is no abdominal tenderness. There is no right CVA tenderness or left CVA tenderness.     Hernia: No hernia is present.  Musculoskeletal:        General: Normal range of motion.     Cervical back: Normal range of motion and neck supple.     Right lower leg: No edema.     Left lower leg: No edema.       Feet:     Comments: Arthritic changes to bilateral hands  Lymphadenopathy:     Cervical: No cervical adenopathy.  Skin:    General: Skin is warm and dry.     Capillary Refill: Capillary refill takes less  than 2 seconds.     Coloration: Skin is not cyanotic, jaundiced or pale.     Findings: No rash.  Neurological:     General: No focal deficit present.     Mental Status: She is alert and oriented to person, place, and time.     Sensory: Sensation is intact.     Motor: Motor function is intact.     Coordination: Coordination is intact.     Gait: Gait is intact.     Deep  Tendon Reflexes: Reflexes are normal and symmetric.  Psychiatric:        Attention and Perception: Attention and perception normal.        Mood and Affect: Mood and affect normal.        Speech: Speech normal.        Behavior: Behavior normal. Behavior is cooperative.        Thought Content: Thought content normal.        Cognition and Memory: Cognition and memory normal.        Judgment: Judgment normal.       Last CBC Lab Results  Component Value Date   WBC 4.3 07/14/2023   HGB 12.6 07/14/2023   HCT 38.4 07/14/2023   MCV 93.9 07/14/2023   MCH 30.8 07/14/2023   RDW 13.0 07/14/2023   PLT 157 07/14/2023   Last metabolic panel Lab Results  Component Value Date   GLUCOSE 104 (H) 07/14/2023   NA 141 07/14/2023   K 4.1 07/14/2023   CL 105 07/14/2023   CO2 29 07/14/2023   BUN 12 07/14/2023   CREATININE 1.11 (H) 07/14/2023   EGFR 55 (L) 07/14/2023   CALCIUM 9.7 07/14/2023   PROT 6.3 07/14/2023   ALBUMIN 4.3 03/10/2023   LABGLOB 2.0 03/10/2023   AGRATIO 1.9 04/10/2022   BILITOT 0.4 07/14/2023   ALKPHOS 63 03/10/2023   AST 17 07/14/2023   ALT 15 07/14/2023   Last lipids Lab Results  Component Value Date   CHOL 234 (H) 03/10/2023   HDL 83 03/10/2023   LDLCALC 130 (H) 03/10/2023   TRIG 120 03/10/2023   CHOLHDL 2.8 03/10/2023    Last thyroid  functions Lab Results  Component Value Date   TSH 2.210 03/10/2023   T4TOTAL 5.4 03/10/2023   Last vitamin D  Lab Results  Component Value Date   VD25OH 48.8 03/10/2023         Assessment & Plan:    Routine Health Maintenance and Physical  Exam  Immunization History  Administered Date(s) Administered   INFLUENZA, HIGH DOSE SEASONAL PF 09/30/2023   Influenza, Seasonal, Injecte, Preservative Fre 10/22/2015   Influenza,inj,Quad PF,6+ Mos 11/07/2018, 11/08/2020   Influenza-Unspecified 11/07/2018, 11/08/2020   Moderna Sars-Covid-2 Vaccination 05/29/2019   PNEUMOCOCCAL CONJUGATE-20 09/30/2023   Pneumococcal Conjugate-13 11/10/2018   Td 02/21/2018   Tdap 01/07/2012, 02/21/2018   Zoster Recombinant(Shingrix) 05/15/2021    Health Maintenance  Topic Date Due   Medicare Annual Wellness (AWV)  Never done   Cervical Cancer Screening (HPV/Pap Cotest)  10/22/2018   COVID-19 Vaccine (2 - Moderna risk series) 10/16/2023 (Originally 06/26/2019)   Zoster Vaccines- Shingrix (2 of 2) 12/30/2023 (Originally 07/10/2021)   Fecal DNA (Cologuard)  12/05/2023   Mammogram  02/17/2024   DTaP/Tdap/Td (4 - Td or Tdap) 02/22/2028   Pneumococcal Vaccine: 50+ Years  Completed   Influenza Vaccine  Completed   DEXA SCAN  Completed   Hepatitis C Screening  Completed   HIV Screening  Completed   Hepatitis B Vaccines 19-59 Average Risk  Aged Out   HPV VACCINES  Aged Out   Meningococcal B Vaccine  Aged Out   Colonoscopy  Discontinued    Discussed health benefits of physical activity, and encouraged her to engage in regular exercise appropriate for her age and condition.  Problem List Items Addressed This Visit       Digestive   Gastroesophageal reflux disease without esophagitis   Relevant Orders   Anemia Profile B     Musculoskeletal and Integument  RA (rheumatoid arthritis) (HCC)   Relevant Orders   Anemia Profile B   CMP14+EGFR   Lipid panel   TSH   VITAMIN D  25 Hydroxy (Vit-D Deficiency, Fractures)   T4, Free     Other   Depression, recurrent   Relevant Medications   escitalopram  (LEXAPRO ) 10 MG tablet   GAD (generalized anxiety disorder)   Relevant Medications   escitalopram  (LEXAPRO ) 10 MG tablet   Blindness of right eye  with normal vision in contralateral eye   Relevant Orders   Anemia Profile B   Other Visit Diagnoses       Annual physical exam    -  Primary   Relevant Orders   Anemia Profile B   CMP14+EGFR   Lipid panel     Plantar fasciitis, right       Relevant Orders   CMP14+EGFR     Encounter for screening for cardiovascular disorders       Relevant Orders   Anemia Profile B   CMP14+EGFR   Lipid panel     Screening for endocrine, nutritional, metabolic and immunity disorder       Relevant Orders   Anemia Profile B   CMP14+EGFR   Lipid panel   TSH   VITAMIN D  25 Hydroxy (Vit-D Deficiency, Fractures)   T4, Free     Screening for colorectal cancer       Relevant Orders   Cologuard     Need for vaccination       Relevant Orders   Pneumococcal conjugate vaccine 20-valent (Prevnar 20) (Completed)     Encounter for immunization       Relevant Orders   Flu vaccine HIGH DOSE PF(Fluzone Trivalent) (Completed)         Mature cataract and glaucoma, right eye, with vision loss Severe mature cataract and glaucoma in the right eye with significant vision loss. Multiple specialists have evaluated the condition with differing opinions on the risks and benefits of surgery. Concerns about potential complications, including eyeball collapse or removal, were discussed. - Continue eye drops for inflammation and pain. - Discontinue previous drops for pressure control due to zero pressure reading.  Rheumatoid arthritis Rheumatoid arthritis managed with prednisone  and Enbrel . Recent flare managed with a short course of prednisone , which was effective.  Plantar fasciitis, right foot Chronic plantar fasciitis in the right foot, exacerbated by recent activity. Pain is worse upon initial standing and improves with walking. No evidence of heel spur on examination. - Recommend conservative management with stretching and icing. - Advise use of frozen water bottle or tennis ball for stretching and icing the  foot. - Instruct to report if symptoms do not improve.  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 Lexapro  is effective in maintaining mood stability. Buspar  is used infrequently for acute anxiety.  Allergic rhinitis Symptoms of allergic rhinitis with cobblestoning noted in the throat. - Manage with Aleve  D as needed for outdoor activities.       Return in about 1 year (around 09/29/2024) for Annual Physical.     Rosaline Bruns, FNP

## 2023-10-01 ENCOUNTER — Ambulatory Visit: Payer: Self-pay | Admitting: Family Medicine

## 2023-10-01 LAB — CMP14+EGFR
ALT: 15 IU/L (ref 0–32)
AST: 20 IU/L (ref 0–40)
Albumin: 4.2 g/dL (ref 3.9–4.9)
Alkaline Phosphatase: 58 IU/L (ref 49–135)
BUN/Creatinine Ratio: 14 (ref 12–28)
BUN: 12 mg/dL (ref 8–27)
Bilirubin Total: 0.6 mg/dL (ref 0.0–1.2)
CO2: 23 mmol/L (ref 20–29)
Calcium: 9.6 mg/dL (ref 8.7–10.3)
Chloride: 104 mmol/L (ref 96–106)
Creatinine, Ser: 0.85 mg/dL (ref 0.57–1.00)
Globulin, Total: 2.3 g/dL (ref 1.5–4.5)
Glucose: 84 mg/dL (ref 70–99)
Potassium: 3.8 mmol/L (ref 3.5–5.2)
Sodium: 140 mmol/L (ref 134–144)
Total Protein: 6.5 g/dL (ref 6.0–8.5)
eGFR: 76 mL/min/1.73 (ref >59)

## 2023-10-01 LAB — ANEMIA PROFILE B
Basophils Absolute: 0 x10E3/uL (ref 0.0–0.2)
Basos: 1 %
EOS (ABSOLUTE): 0.2 x10E3/uL (ref 0.0–0.4)
Eos: 3 %
Ferritin: 171 ng/mL — ABNORMAL HIGH (ref 15–150)
Folate: >20 ng/mL (ref >3.0)
Hematocrit: 39.8 % (ref 34.0–46.6)
Hemoglobin: 13 g/dL (ref 11.1–15.9)
Immature Grans (Abs): 0 x10E3/uL (ref 0.0–0.1)
Immature Granulocytes: 0 %
Iron Saturation: 44 % (ref 15–55)
Iron: 137 ug/dL (ref 27–139)
Lymphocytes Absolute: 1.9 x10E3/uL (ref 0.7–3.1)
Lymphs: 35 %
MCH: 31.2 pg (ref 26.6–33.0)
MCHC: 32.7 g/dL (ref 31.5–35.7)
MCV: 95 fL (ref 79–97)
Monocytes Absolute: 0.5 x10E3/uL (ref 0.1–0.9)
Monocytes: 10 %
Neutrophils Absolute: 2.9 x10E3/uL (ref 1.4–7.0)
Neutrophils: 51 %
Platelets: 173 x10E3/uL (ref 150–450)
RBC: 4.17 x10E6/uL (ref 3.77–5.28)
RDW: 13.1 % (ref 11.7–15.4)
Retic Ct Pct: 1.4 % (ref 0.6–2.6)
Total Iron Binding Capacity: 308 ug/dL (ref 250–450)
UIBC: 171 ug/dL (ref 118–369)
Vitamin B-12: 535 pg/mL (ref 232–1245)
WBC: 5.5 x10E3/uL (ref 3.4–10.8)

## 2023-10-01 LAB — VITAMIN D 25 HYDROXY (VIT D DEFICIENCY, FRACTURES): Vit D, 25-Hydroxy: 47.4 ng/mL (ref 30.0–100.0)

## 2023-10-01 LAB — TSH: TSH: 1.97 u[IU]/mL (ref 0.450–4.500)

## 2023-10-01 LAB — LIPID PANEL
Chol/HDL Ratio: 3.7 ratio (ref 0.0–4.4)
Cholesterol, Total: 269 mg/dL — ABNORMAL HIGH (ref 100–199)
HDL: 72 mg/dL (ref >39)
LDL Chol Calc (NIH): 169 mg/dL — ABNORMAL HIGH (ref 0–99)
Triglycerides: 155 mg/dL — ABNORMAL HIGH (ref 0–149)
VLDL Cholesterol Cal: 28 mg/dL (ref 5–40)

## 2023-10-01 LAB — T4, FREE: Free T4: 1.03 ng/dL (ref 0.82–1.77)

## 2023-10-04 DIAGNOSIS — H401421 Capsular glaucoma with pseudoexfoliation of lens, left eye, mild stage: Secondary | ICD-10-CM | POA: Diagnosis not present

## 2023-10-04 DIAGNOSIS — H401413 Capsular glaucoma with pseudoexfoliation of lens, right eye, severe stage: Secondary | ICD-10-CM | POA: Diagnosis not present

## 2023-10-04 DIAGNOSIS — H268 Other specified cataract: Secondary | ICD-10-CM | POA: Diagnosis not present

## 2023-10-06 DIAGNOSIS — M722 Plantar fascial fibromatosis: Secondary | ICD-10-CM

## 2023-10-06 HISTORY — DX: Plantar fascial fibromatosis: M72.2

## 2023-10-10 ENCOUNTER — Other Ambulatory Visit: Payer: Self-pay | Admitting: Internal Medicine

## 2023-10-10 DIAGNOSIS — M069 Rheumatoid arthritis, unspecified: Secondary | ICD-10-CM

## 2023-10-11 NOTE — Telephone Encounter (Signed)
 Last Fill: 07/14/2023  Next Visit: 10/25/2023  Last Visit: 07/14/2023  Dx:  Rheumatoid arthritis involving both hands, unspecified whether rheumatoid factor present   Current Dose per office note on 07/14/2023: prednisone  5 mg daily.   Okay to refill Prednisone ?

## 2023-10-12 NOTE — Progress Notes (Signed)
 Office Visit Note  Patient: Katie [name]             Date of Birth: Nov 19, 1960           MRN: [id number]             PCP: Severa Rock HERO, FNP Referring: Severa Rock HERO, FNP Visit Date: 10/25/2023   Subjective:   History of Present Illness:  Discussed the use of AI scribe software for clinical note transcription with the patient, who gave verbal consent to proceed.  History of Present Illness   Katie [name] is a 64 y.o. female here for follow up for seronegative RA on Enbrel  50 mg subcu weekly and prednisone  5 mg daily.   She continues to manage her rheumatoid arthritis with Enbrel  and low-dose prednisone , with no major changes since her last visit. She experienced a flare-up recently, for which she took a six-day course of medication that helped alleviate the symptoms. Despite ongoing pain in her hips, hands, and plantar fasciitis, she maintains her daily activities, including mowing, weed eating, and leaf blowing, although these activities may be slightly slowed by her symptoms.  She has consulted three different eye doctors since her last visit. The first doctor noted her eye pressure was zero and did not recommend glaucoma drops. The second doctor found her eye pressure to be high and prescribed drops. The third doctor identified significant cataracts in her right eye, which are obscuring her vision. The cataract is so mature that it prevents visualization of the eye's condition behind it. She has an appointment scheduled for November 4th to further address this issue. The cataract significantly impacts her daily life, as she experiences difficulty seeing, bumping into people, and not recognizing when someone is trying to shake her hand.  No recent viral illnesses or antibiotic use. Recent lab work, done during a Medicare physical, showed normal blood count, kidney, and liver function tests, but high cholesterol, which she attributes to recent increased ice cream consumption. She  has been taking red yeast to manage her cholesterol.       Previous HPI 07/14/2023 Katie [name] is a 63 y.o. female here for follow up for seronegative RA on Enbrel  50 mg subcu weekly and prednisone  5 mg daily.     She experiences joint pain and stiffness, particularly in her hands, which become stiff and sore, especially after physical activity or extensive use. One specific area in her hand remains consistently sore at the right 3rd MCP joint. She continues to take Enbrel  and 5 mg of prednisone  consistently without issues. Morning stiffness lasting for several minutes.   She reports persistent tiredness and sleep disturbances, attributing some of her sleep issues to her dogs and her husband's condition, as he has Parkinson's disease and experiences disruptive sleep behaviors.   She mentions long-standing hip pain, describing soreness in her hips, particularly after sitting with her legs crossed for prolonged time due to her dogs' positioning. No new or acute soreness in other areas.        Previous HPI 04/14/2023 Katie [name] is a 63 y.o. female here for follow up for seronegative RA on Enbrel  50 mg subcu weekly and prednisone  5 mg daily.  Overall symptoms have been doing well with no severe flareups or exacerbation.  Currently she is off the prednisone  for the past week and is noticing increased symptoms within a few days of stopping the medication.  Currently issues increased pain in the right hand, both  elbows, bottoms of both feet.  Morning stiffness less than an hour in duration and not seeing visible joint swelling.  Also has 1 unrelated complaint today currently with right eye irritation started after she thinks some debris or allergen exposure against the eye when leaf blowing.     Previous HPI 01/13/2023 Katie [name] is a 63 y.o. female here for follow up for seronegative RA on Enbrel  50 mg subcu weekly and prednisone  5 mg daily.  She presents with persistent hand pain,  particularly in the right hand. They report using various home remedies to manage the pain, including paraffin wax, heated gloves, and arthritis compression gloves, which provide some relief.  She worked with occupational therapy with some improvement in grip strength and function although hand pain remained about the same. The patient also notes shoulder pain, which they attribute to recent physical activity rather than their chronic condition.   In addition to their arthritis, the patient has a persistent rash in their waistline area that has been present since the summer. Despite following their doctor's advice for care, the rash continues to cause discomfort and shows signs of necrosis. The patient plans to see a dermatologist once their new insurance coverage begins.   The patient also has a history of trigger finger in one of their fingers, which is now notably crooked and sore to touch. They report that this finger always hurts the most. Despite the pain, the patient remains active, doing chair yoga and other stretches to maintain mobility. They note that their symptoms are tolerable and do not seem to be worsening.    Previous HPI 10/12/2022 Katie [name] is a 63 y.o. female here for follow up for seronegative RA on Enbrel  50 mg subcu weekly and prednisone  5 mg daily.  He started Enbrel  weekly injections first dose on July 7 initially had rash and soreness around the initial injection sites.  Subsequently treated with oral antihistamines and this particular side effect has decreased.  However she still concerned about ongoing pain and stiffness in her hands worse around the third digit typically worse after physical activity and she does stay busy cleaning several homes.  Not sure whether the Enbrel  has made a big improvement in hand symptoms on top of the maintenance prednisone .  Notices benefit with heat and compression.  The rest of her joints are all doing well.   Previous  HPI 04/23/2022 Katie [name] is a 63 y.o. female here for follow up for seronegative RA on prednisone  5 mg daily. Overall symptoms are doing well with this dose but still has some breakthrough symptoms with bilateral shoulder pain and some swelling in finger joints. She has noticed ongoing intermittent palpitation symptoms again despite remaining off Humira . Cardiac evaluation for this has been unremarkable so far no evidence of serious arrhythmia.    Previous HPI 10/10/21 Katie [name] is a 63 y.o. female here for follow up for seronegative RA on prednisone  5 mg daily. She stopped Humira  after last visit due to developing palpitations and pressure in her chest and neck after taking the medication. Since stopping it these symptoms completely went away.  She never recalls any similar symptoms in the past and has not had any with the prednisone .   Previous HPI 05/26/2021 Katie [name] is a 63 y.o. female here for follow up for seronegative RA on prednisone  5 mg daily. Humira  new start 04/10/2021.  Symptoms slightly improved with the medication so far still has soreness in right  hand MCPs main affected area. After about 3 weeks since starting the medication she has noticed an increase in symptoms she feels are similar to palpitations or a panic attack and like there is pressure over the upper part of her chest.  These come and go within a few minutes but have been occurring pretty much daily for weeks.  She has used her husband's A-fib monitor to check and not seen any obvious rhythm abnormality testing at home.  She tried taking hydroxyzine  as needed as previously prescribed for her did not notice any symptom improvement just sedation.   Previous HPI 03/19/2021 Katie [name] is a 63 y.o. female here for follow up for seronegative RA on prednisone  5 mg daily. Labs were checked at initial visit planning for trying biologic DMARD due to intolerance of oral medications. She has increased joint pain  in multiple areas since stopping the low dose prednisone  weeks ago. Worst in neck and shoulders and in right hand. Also started having a faint skin rash around her neck and collar area in the past week.   Previous HPI 02/26/21 Katie [name] is a 63 y.o. female here for seronegative RA. She saw Dr. Everlean in 2017-2018 tried a few medications without much success but had good response to low dose prednisone . She did not tolerate methotrexate or leflunomide with liver function changes and on methotrexate also had significant hair loss. She saw Dr. Mai once for this problem but did not follow up for any long term treatment. She is currently on prednisone  5 mg daily which is controlling symptoms mostly but has a lot of pain any time she comes off this. She has noticed some increase in forward bending of neck and also has a chronic enlarged right supraclavicular lymph node for years.  Bone densitometry from 2015 showing osteopenia t-score -1.4.   DMARD Hx Enbrel - Not effective Humira - Palpitations? MTX- LFT changes, alopecia LEF- LFT changes   Review of Systems  Constitutional:  Positive for fatigue.  HENT:  Positive for mouth dryness. Negative for mouth sores.   Eyes:  Positive for dryness.  Respiratory:  Negative for shortness of breath.   Cardiovascular:  Positive for palpitations. Negative for chest pain.  Gastrointestinal:  Positive for diarrhea. Negative for blood in stool and constipation.  Endocrine: Negative for increased urination.  Genitourinary:  Negative for involuntary urination.  Musculoskeletal:  Positive for joint pain, joint pain, myalgias, morning stiffness and myalgias. Negative for gait problem, joint swelling, muscle weakness and muscle tenderness.  Skin:  Negative for color change, rash, hair loss and sensitivity to sunlight.  Allergic/Immunologic: Negative for susceptible to infections.  Neurological:  Negative for dizziness and headaches.  Hematological:   Negative for swollen glands.  Psychiatric/Behavioral:  Negative for depressed mood and sleep disturbance. The patient is not nervous/anxious.     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 03/19/2021   High risk medication use 02/26/2021   Decreased libido 06/02/2018   Depression, recurrent 05/27/2018   GAD (generalized anxiety disorder) 05/27/2018   Gastroesophageal reflux disease without esophagitis 05/27/2018   RA (rheumatoid arthritis) (HCC) 05/27/2016    Past Medical History:  Diagnosis Date   Arthritis    RA   Asthma    no attack since childhood   Blood transfusion    Contact lens/glasses fitting    wears glasses or contacts   Cystocele  Depression    External hemorrhoids    Fibromyalgia    GERD (gastroesophageal reflux disease)    past hx    Glaucoma    RIGHT EYE   HPV (human papilloma virus) infection    Neuromuscular disorder (HCC)    fibromyalgia   Plantar fasciitis of right foot 10/2023   Syncope 2015   Pt questioned seizure with this episode - nothing like since - vaso vagal response    Wears dentures    top    Family History  Problem Relation Age of Onset   Hypertension Mother    Hypothyroidism Mother    Rheum arthritis Mother    Emphysema Father    Kidney disease Sister    Hypertension Sister    Bone cancer Daughter        Died at age 67   Celiac disease Daughter    Hypertension Daughter    Colon cancer Neg Hx    Colon polyps Neg Hx    Past Surgical History:  Procedure Laterality Date   ABDOMINAL EXPLORATION SURGERY  1984   bleed after hyst   APPENDECTOMY  1983   BLADDER SUSPENSION  2010   BREAST ENHANCEMENT SURGERY  2000   CARPAL TUNNEL RELEASE  1995   rt   CARPAL TUNNEL RELEASE Left 12/09/2012   Procedure: LEFT LIMITED OPEN CARPAL TUNNEL RELEASE;  Surgeon: Elsie Mussel, MD;  Location: [facility];  Service: Orthopedics;   Laterality: Left;   COLONOSCOPY     CYSTOSCOPY     DIAGNOSTIC LAPAROSCOPY     Medial Branch Block  02/12/2022   Medial Branch Block  03/12/2022   MINI SHUNT INSERTION  10/14/2011   Procedure: INSERTION OF MINI SHUNT;  Surgeon: Gaither Quan, MD;  Location: [facility] OR;  Service: Ophthalmology;  Laterality: Right;   POLYPECTOMY     Tubaligation  1980   VAGINAL HYSTERECTOMY  1983   Social History   Social History Narrative   Not on file   Immunization History  Administered Date(s) Administered   INFLUENZA, HIGH DOSE SEASONAL PF 09/30/2023   Influenza, Seasonal, Injecte, Preservative Fre 10/22/2015   Influenza,inj,Quad PF,6+ Mos 11/07/2018, 11/08/2020   Influenza-Unspecified 11/07/2018, 11/08/2020   Moderna Sars-Covid-2 Vaccination 05/29/2019   PNEUMOCOCCAL CONJUGATE-20 09/30/2023   Pneumococcal Conjugate-13 11/10/2018   Td 02/21/2018   Tdap 01/07/2012, 02/21/2018   Zoster Recombinant(Shingrix) 02/11/2021, 05/15/2021     Objective: Vital Signs: BP (!) 84/47   Pulse 74   Temp (!) 96.3 F (35.7 C)   Resp 16   Ht 5' 1 (1.549 m)   Wt 117 lb 4.8 oz (53.2 kg)   BMI 22.16 kg/m    Physical Exam Eyes:     Conjunctiva/sclera: Conjunctivae normal.     Comments: Obscuring cataract present  Cardiovascular:     Rate and Rhythm: Normal rate and regular rhythm.  Pulmonary:     Effort: Pulmonary effort is normal.     Breath sounds: Normal breath sounds.  Musculoskeletal:     Right lower leg: No edema.     Left lower leg: No edema.  Lymphadenopathy:     Cervical: No cervical adenopathy.  Skin:    General: Skin is warm and dry.     Findings: No rash.  Neurological:     Mental Status: She is alert.  Psychiatric:        Mood and Affect: Mood normal.      Musculoskeletal Exam:  Shoulders full ROM no tenderness or swelling Elbows full  ROM no tenderness or swelling Wrists full ROM no tenderness or swelling Fingers full ROM b/l, bony widening at thumbs, right 2nd-3rd MCPs, and  multiple DIPs heberdon's nodes, there is mild tenderness on palmar side proximal to right 3rd digit Knees full ROM no tenderness or swelling Ankles full ROM no tenderness or swelling     Investigation: No additional findings.  Imaging: No results found.  Recent Labs: Lab Results  Component Value Date   WBC 5.5 09/30/2023   HGB 13.0 09/30/2023   PLT 173 09/30/2023   NA 140 09/30/2023   K 3.8 09/30/2023   CL 104 09/30/2023   CO2 23 09/30/2023   GLUCOSE 84 09/30/2023   BUN 12 09/30/2023   CREATININE 0.85 09/30/2023   BILITOT 0.6 09/30/2023   ALKPHOS 58 09/30/2023   AST 20 09/30/2023   ALT 15 09/30/2023   PROT 6.5 09/30/2023   ALBUMIN 4.2 09/30/2023   CALCIUM 9.6 09/30/2023   GFRAA 89 05/27/2018   QFTBGOLDPLUS NEGATIVE 04/14/2023    Speciality Comments: MTX stopped due to elevated LFTs/hairloss; leflunomide stopped due to elevated LFTs, Humira  started 04/10/21  Procedures:  No procedures performed Allergies: Codeine   Assessment / Plan:     Visit Diagnoses: Rheumatoid arthritis involving both hands, unspecified whether rheumatoid factor present (HCC) - Plan: XR Hand 2 View Right, XR Hand 2 View Left Inflammatory disease appears well controlled on current regimen. No recent flare. Checking bilateral hand x-rays to update monitoring for any signs of radiographic progression and with her persistent noninflammatory hand pain and with use.  Never had previous x-rays at this office just outside report reviewed. -Continue Enbrel  50 mg [place] weekly -Continue prednisone  5 mg daily  High risk medication use - Enbrel  50 mg subcu weekly Recent labs reviewed including blood count metabolic panel and lipid profile no problems for continuing Enbrel .  No serious interval infections.  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  Hypercholesterolemia Increased cardiovascular risk due to rheumatoid arthritis. Discussed statin therapy benefits if coronary plaques present. - Consider coronary calcium score test for coronary artery plaques. - Discuss statin therapy if coronary plaques present.  Primary osteoarthritis of hands Chronic hand pain likely due to osteoarthritis. No recent x-rays performed, checking as above.      Orders: Orders Placed This Encounter  Procedures   XR Hand 2 View Right   XR Hand 2 View Left   No orders of the defined types were placed in this encounter.    Follow-Up Instructions: Return in about 3 months (around 01/25/2024) for RA on ENB/GC f/u 3mos.   Lonni LELON Ester, MD  Note - This record has been created using [employer].  Chart creation errors have been sought, but may not always  have been located. Such creation errors do not reflect on  the standard of medical care.

## 2023-10-13 ENCOUNTER — Ambulatory Visit: Payer: Self-pay | Admitting: Family Medicine

## 2023-10-13 ENCOUNTER — Encounter: Payer: Self-pay | Admitting: Family Medicine

## 2023-10-13 ENCOUNTER — Ambulatory Visit: Admitting: Family Medicine

## 2023-10-13 VITALS — BP 93/51 | HR 78 | Temp 97.8°F | Ht 61.0 in | Wt 116.6 lb

## 2023-10-13 DIAGNOSIS — Z0000 Encounter for general adult medical examination without abnormal findings: Secondary | ICD-10-CM

## 2023-10-13 NOTE — Progress Notes (Signed)
 Subjective:    Katie [name] is a 63 y.o. female who presents for a Welcome to Medicare exam.   Cardiac Risk Factors include: advanced age (>13men, >66 women)      Objective:    Today's Vitals   10/13/23 1007  BP: (!) 93/51  Pulse: 78  Temp: 97.8 F (36.6 C)  SpO2: 97%  Weight: 116 lb 9.6 oz (52.9 kg)  Height: 5' 1 (1.549 m)  Body mass index is 22.03 kg/m.  Medications Outpatient Encounter Medications as of 10/13/2023  Medication Sig   acetaminophen  (TYLENOL ) 500 MG tablet Take 500 mg by mouth every 6 (six) hours as needed.   alendronate (FOSAMAX) 70 MG tablet Take 1 tablet by mouth once a week.   Ascorbic Acid (VITAMIN C ADULT GUMMIES PO)    Aspirin 81 MG CAPS Aspirin 81 mg   brimonidine (ALPHAGAN) 0.2 % ophthalmic solution 1 drop 2 (two) times daily.   busPIRone  (BUSPAR ) 10 MG tablet TAKE 1 TABLET BY MOUTH TWICE A DAY   calcium carbonate 1250 MG capsule Take 1,250 mg by mouth 2 (two) times daily with a meal.   Cyanocobalamin (B-12 PO) Take by mouth.   dorzolamide-timolol (COSOPT) 2-0.5 % ophthalmic solution 1 drop 2 (two) times daily.   escitalopram  (LEXAPRO ) 10 MG tablet Take 1 tablet (10 mg total) by mouth daily.   etanercept  (ENBREL  MINI) 50 MG/ML injection Inject 1 mL (50 mg total) into the skin once a week.   folic acid (FOLVITE) 800 MCG tablet Take 400 mcg by mouth daily.   ibuprofen (ADVIL) 200 MG tablet Take 200 mg by mouth every 6 (six) hours as needed.   MAGNESIUM PO Take by mouth 3 times/day as needed-between meals & bedtime.   Multiple Vitamin (MULTIVITAMIN) tablet Take 1 tablet by mouth daily.     omeprazole  (PRILOSEC) 20 MG capsule Take 1 capsule (20 mg total) by mouth 2 (two) times daily before a meal.   ondansetron  (ZOFRAN ) 4 MG tablet Take 1 tablet (4 mg total) by mouth every 8 (eight) hours as needed for nausea or vomiting.   predniSONE  (DELTASONE ) 5 MG tablet TAKE 1-2 TABLETS BY MOUTH DAILY AS NEEDED   PROAIR  HFA 108 (90 Base) MCG/ACT inhaler  TAKE 2 PUFFS BY MOUTH EVERY 6 HOURS AS NEEDED FOR WHEEZE OR SHORTNESS OF BREATH   Probiotic Product (PROBIOTIC DAILY PO) Take by mouth daily.   TURMERIC PO Take by mouth.   VITAMIN A PO Take by mouth.   VITAMIN E PO Take by mouth.   No facility-administered encounter medications on file as of 10/13/2023.     History: Past Medical History:  Diagnosis Date   Arthritis    RA   Asthma    no attack since childhood   Blood transfusion    Contact lens/glasses fitting    wears glasses or contacts   Cystocele    Depression    External hemorrhoids    Fibromyalgia    GERD (gastroesophageal reflux disease)    past hx    Glaucoma    RIGHT EYE   HPV (human papilloma virus) infection    Neuromuscular disorder (HCC)    fibromyalgia   Syncope 2015   Pt questioned seizure with this episode - nothing like since - vaso vagal response    Wears dentures    top   Past Surgical History:  Procedure Laterality Date   ABDOMINAL EXPLORATION SURGERY  1984   bleed after hyst   APPENDECTOMY  1983  BLADDER SUSPENSION  2010   BREAST ENHANCEMENT SURGERY  2000   CARPAL TUNNEL RELEASE  1995   rt   CARPAL TUNNEL RELEASE Left 12/09/2012   Procedure: LEFT LIMITED OPEN CARPAL TUNNEL RELEASE;  Surgeon: Elsie Mussel, MD;  Location: [facility];  Service: Orthopedics;  Laterality: Left;   COLONOSCOPY     CYSTOSCOPY     DIAGNOSTIC LAPAROSCOPY     Medial Branch Block  02/12/2022   Medial Branch Block  03/12/2022   MINI SHUNT INSERTION  10/14/2011   Procedure: INSERTION OF MINI SHUNT;  Surgeon: Gaither Quan, MD;  Location: [facility] OR;  Service: Ophthalmology;  Laterality: Right;   POLYPECTOMY     Tubaligation  1980   VAGINAL HYSTERECTOMY  1983    Family History  Problem Relation Age of Onset   Hypertension Mother    Hypothyroidism Mother    Rheum arthritis Mother    Emphysema Father    Kidney disease Sister    Hypertension Sister    Bone cancer Daughter        Died at age 25   Celiac  disease Daughter    Hypertension Daughter    Colon cancer Neg Hx    Colon polyps Neg Hx    Social History   Occupational History    Employer: [employer]  Tobacco Use   Smoking status: Former    Current packs/day: 0.00    Average packs/day: 0.3 packs/day for 2.0 years (0.5 ttl pk-yrs)    Types: Cigarettes    Start date: 10/12/1991    Quit date: 10/11/1993    Years since quitting: 30.0    Passive exposure: Never   Smokeless tobacco: Never  Vaping Use   Vaping status: Never Used  Substance and Sexual Activity   Alcohol use: Not Currently   Drug use: No   Sexual activity: Not on file    Tobacco Counseling Counseling given: Not Answered   Immunizations and Health Maintenance Immunization History  Administered Date(s) Administered   INFLUENZA, HIGH DOSE SEASONAL PF 09/30/2023   Influenza, Seasonal, Injecte, Preservative Fre 10/22/2015   Influenza,inj,Quad PF,6+ Mos 11/07/2018, 11/08/2020   Influenza-Unspecified 11/07/2018, 11/08/2020   Moderna Sars-Covid-2 Vaccination 05/29/2019   PNEUMOCOCCAL CONJUGATE-20 09/30/2023   Pneumococcal Conjugate-13 11/10/2018   Td 02/21/2018   Tdap 01/07/2012, 02/21/2018   Zoster Recombinant(Shingrix) 02/11/2021, 05/15/2021   Health Maintenance Due  Topic Date Due   Cervical Cancer Screening (HPV/Pap Cotest)  10/22/2018    Activities of Daily Living    10/13/2023   10:10 AM  In your present state of health, do you have any difficulty performing the following activities:  Hearing? 0  Vision? 1  Difficulty concentrating or making decisions? 0  Walking or climbing stairs? 0  Dressing or bathing? 0  Doing errands, shopping? 0  Preparing Food and eating ? N  Using the Toilet? N  In the past six months, have you accidently leaked urine? Y  Do you have problems with loss of bowel control? Y  Managing your Medications? N  Managing your Finances? N  Housekeeping or managing your Housekeeping? N    Physical Exam   Physical  Exam Vitals and nursing note reviewed.  Constitutional:      Appearance: Normal appearance.  HENT:     Head: Normocephalic and atraumatic.     Nose: Nose normal.     Mouth/Throat:     Mouth: Mucous membranes are moist.  Cardiovascular:     Rate and Rhythm: Normal rate.  Pulmonary:     Effort: Pulmonary effort is normal.  Skin:    General: Skin is warm and dry.     Capillary Refill: Capillary refill takes less than 2 seconds.  Neurological:     General: No focal deficit present.     Mental Status: She is alert and oriented to person, place, and time.  Psychiatric:        Mood and Affect: Mood normal.        Behavior: Behavior normal.        Thought Content: Thought content normal.        Judgment: Judgment normal.      Advanced Directives: Does Patient Have a Medical Advance Directive?: No Would patient like information on creating a medical advance directive?: Yes (MAU/Ambulatory/Procedural Areas - Information given)  EKG:  normal EKG, normal sinus rhythm, unchanged from previous tracings      Assessment:    Bentley Haralson was seen today for welcome to medicare.  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 0  Risk for fall due to : No Fall Risks  Follow up Falls evaluation completed    Cognitive Function:    10/13/2023   10:11 AM  MMSE -  Mini Mental State Exam  Orientation to time 5  Orientation to Place 5  Registration 3  Attention/ Calculation 5  Recall 3  Language- name 2 objects 2  Language- repeat 1  Language- follow 3 step command 3  Language- read & follow direction 1  Write a sentence 1  Copy design 1  Total score 30        Patient Care Team: Severa Rock HERO, FNP as PCP - General (Family Medicine)     Plan:  Encounter for Medicare annual wellness exam -     EKG 12-Lead  I have personally reviewed and noted the following in the patient's chart:   Medical and social history Use of alcohol, tobacco or illicit drugs  Current medications and supplements including opioid prescriptions. Patient is not currently taking opioid prescriptions. Functional ability and status Nutritional status Physical activity Advanced directives List of other physicians Hospitalizations, surgeries, and ER visits in previous 12 months Vitals Screenings to include cognitive, depression, and falls Referrals and appointments  In addition, I have reviewed and discussed with patient certain preventive protocols, quality metrics, and best practice recommendations. A written personalized care plan for preventive services as well as general preventive health recommendations were provided to patient.     Rosaline Severa, FNP 10/13/2023

## 2023-10-13 NOTE — Progress Notes (Deleted)
 Subjective:    Katie [name] is a 63 y.o. female who presents for a Welcome to Medicare exam.   Cardiac Risk Factors include: advanced age (>37men, >16 women)      Objective:    Today's Vitals   10/13/23 1007  BP: (!) 93/51  Pulse: 78  Temp: 97.8 F (36.6 C)  SpO2: 97%  Weight: 116 lb 9.6 oz (52.9 kg)  Height: 5' 1 (1.549 m)  Body mass index is 22.03 kg/m.  Medications Outpatient Encounter Medications as of 10/13/2023  Medication Sig   acetaminophen  (TYLENOL ) 500 MG tablet Take 500 mg by mouth every 6 (six) hours as needed.   alendronate (FOSAMAX) 70 MG tablet Take 1 tablet by mouth once a week.   Ascorbic Acid (VITAMIN C ADULT GUMMIES PO)    Aspirin 81 MG CAPS Aspirin 81 mg   brimonidine (ALPHAGAN) 0.2 % ophthalmic solution 1 drop 2 (two) times daily.   busPIRone  (BUSPAR ) 10 MG tablet TAKE 1 TABLET BY MOUTH TWICE A DAY   calcium carbonate 1250 MG capsule Take 1,250 mg by mouth 2 (two) times daily with a meal.   Cyanocobalamin (B-12 PO) Take by mouth.   dorzolamide-timolol (COSOPT) 2-0.5 % ophthalmic solution 1 drop 2 (two) times daily.   escitalopram  (LEXAPRO ) 10 MG tablet Take 1 tablet (10 mg total) by mouth daily.   etanercept  (ENBREL  MINI) 50 MG/ML injection Inject 1 mL (50 mg total) into the skin once a week.   folic acid (FOLVITE) 800 MCG tablet Take 400 mcg by mouth daily.   ibuprofen (ADVIL) 200 MG tablet Take 200 mg by mouth every 6 (six) hours as needed.   MAGNESIUM PO Take by mouth 3 times/day as needed-between meals & bedtime.   Multiple Vitamin (MULTIVITAMIN) tablet Take 1 tablet by mouth daily.     omeprazole  (PRILOSEC) 20 MG capsule Take 1 capsule (20 mg total) by mouth 2 (two) times daily before a meal.   ondansetron  (ZOFRAN ) 4 MG tablet Take 1 tablet (4 mg total) by mouth every 8 (eight) hours as needed for nausea or vomiting.   predniSONE  (DELTASONE ) 5 MG tablet TAKE 1-2 TABLETS BY MOUTH DAILY AS NEEDED   PROAIR  HFA 108 (90 Base) MCG/ACT inhaler  TAKE 2 PUFFS BY MOUTH EVERY 6 HOURS AS NEEDED FOR WHEEZE OR SHORTNESS OF BREATH   Probiotic Product (PROBIOTIC DAILY PO) Take by mouth daily.   TURMERIC PO Take by mouth.   VITAMIN A PO Take by mouth.   VITAMIN E PO Take by mouth.   No facility-administered encounter medications on file as of 10/13/2023.     History: Past Medical History:  Diagnosis Date   Arthritis    RA   Asthma    no attack since childhood   Blood transfusion    Contact lens/glasses fitting    wears glasses or contacts   Cystocele    Depression    External hemorrhoids    Fibromyalgia    GERD (gastroesophageal reflux disease)    past hx    Glaucoma    RIGHT EYE   HPV (human papilloma virus) infection    Neuromuscular disorder (HCC)    fibromyalgia   Syncope 2015   Pt questioned seizure with this episode - nothing like since - vaso vagal response    Wears dentures    top   Past Surgical History:  Procedure Laterality Date   ABDOMINAL EXPLORATION SURGERY  1984   bleed after hyst   APPENDECTOMY  1983  BLADDER SUSPENSION  2010   BREAST ENHANCEMENT SURGERY  2000   CARPAL TUNNEL RELEASE  1995   rt   CARPAL TUNNEL RELEASE Left 12/09/2012   Procedure: LEFT LIMITED OPEN CARPAL TUNNEL RELEASE;  Surgeon: Elsie Mussel, MD;  Location: [facility];  Service: Orthopedics;  Laterality: Left;   COLONOSCOPY     CYSTOSCOPY     DIAGNOSTIC LAPAROSCOPY     Medial Branch Block  02/12/2022   Medial Branch Block  03/12/2022   MINI SHUNT INSERTION  10/14/2011   Procedure: INSERTION OF MINI SHUNT;  Surgeon: Gaither Quan, MD;  Location: [facility] OR;  Service: Ophthalmology;  Laterality: Right;   POLYPECTOMY     Tubaligation  1980   VAGINAL HYSTERECTOMY  1983    Family History  Problem Relation Age of Onset   Hypertension Mother    Hypothyroidism Mother    Rheum arthritis Mother    Emphysema Father    Kidney disease Sister    Hypertension Sister    Bone cancer Daughter        Died at age 76   Celiac  disease Daughter    Hypertension Daughter    Colon cancer Neg Hx    Colon polyps Neg Hx    Social History   Occupational History    Employer: [employer]  Tobacco Use   Smoking status: Former    Current packs/day: 0.00    Average packs/day: 0.3 packs/day for 2.0 years (0.5 ttl pk-yrs)    Types: Cigarettes    Start date: 10/12/1991    Quit date: 10/11/1993    Years since quitting: 30.0    Passive exposure: Never   Smokeless tobacco: Never  Vaping Use   Vaping status: Never Used  Substance and Sexual Activity   Alcohol use: Not Currently   Drug use: No   Sexual activity: Not on file    Tobacco Counseling Counseling given: Not Answered   Immunizations and Health Maintenance Immunization History  Administered Date(s) Administered   INFLUENZA, HIGH DOSE SEASONAL PF 09/30/2023   Influenza, Seasonal, Injecte, Preservative Fre 10/22/2015   Influenza,inj,Quad PF,6+ Mos 11/07/2018, 11/08/2020   Influenza-Unspecified 11/07/2018, 11/08/2020   Moderna Sars-Covid-2 Vaccination 05/29/2019   PNEUMOCOCCAL CONJUGATE-20 09/30/2023   Pneumococcal Conjugate-13 11/10/2018   Td 02/21/2018   Tdap 01/07/2012, 02/21/2018   Zoster Recombinant(Shingrix) 02/11/2021, 05/15/2021   Health Maintenance Due  Topic Date Due   Cervical Cancer Screening (HPV/Pap Cotest)  10/22/2018    Activities of Daily Living    10/13/2023   10:10 AM  In your present state of health, do you have any difficulty performing the following activities:  Hearing? 0  Vision? 1  Difficulty concentrating or making decisions? 0  Walking or climbing stairs? 0  Dressing or bathing? 0  Doing errands, shopping? 0  Preparing Food and eating ? N  Using the Toilet? N  In the past six months, have you accidently leaked urine? Y  Do you have problems with loss of bowel control? Y  Managing your Medications? N  Managing your Finances? N  Housekeeping or managing your Housekeeping? N    Physical Exam   Physical  Exam (optional), or other factors deemed appropriate based on the beneficiary's medical and social history and current clinical standards.   Advanced Directives: Does Patient Have a Medical Advance Directive?: No Would patient like information on creating a medical advance directive?: Yes (MAU/Ambulatory/Procedural Areas - Information given)  EKG:  {ekg findings:315101}      Assessment:  This is a routine wellness examination for this patient   Vision/Hearing screen No results found.   Goals   None     Depression Screen    09/30/2023   11:48 AM 03/10/2023   10:56 AM 01/13/2023    1:05 PM 11/24/2022    2:57 PM  PHQ 2/9 Scores  PHQ - 2 Score 1 0 0 0  PHQ- 9 Score 3 3  1      Fall Risk    09/30/2023   11:48 AM  Fall Risk   Falls in the past year? 0  Risk for fall due to : No Fall Risks  Follow up Falls evaluation completed    Cognitive Function:        Patient Care Team: Severa Rock HERO, FNP as PCP - General (Family Medicine)     Plan:   Krisha Beegle was seen today for welcome to medicare.  Diagnoses and all orders for this visit:  Encounter for Medicare annual wellness exam     I have personally reviewed and noted the following in the patient's chart:   Medical and social history Use of alcohol, tobacco or illicit drugs  Current medications and supplements including opioid prescriptions. Patient is not currently taking opioid prescriptions. Functional ability and status Nutritional status Physical activity Advanced directives List of other physicians Hospitalizations, surgeries, and ER visits in previous 12 months Vitals Screenings to include cognitive, depression, and falls Referrals and appointments  In addition, I have reviewed and discussed with patient certain preventive protocols, quality metrics, and best practice recommendations. A written personalized care plan for preventive services as well as general preventive health  recommendations were provided to patient.     Rosaline Severa, FNP 10/13/2023

## 2023-10-21 DIAGNOSIS — Z1212 Encounter for screening for malignant neoplasm of rectum: Secondary | ICD-10-CM | POA: Diagnosis not present

## 2023-10-21 DIAGNOSIS — Z1211 Encounter for screening for malignant neoplasm of colon: Secondary | ICD-10-CM | POA: Diagnosis not present

## 2023-10-22 ENCOUNTER — Telehealth: Payer: Self-pay | Admitting: Pharmacist

## 2023-10-22 ENCOUNTER — Encounter: Payer: Self-pay | Admitting: Pharmacist

## 2023-10-22 NOTE — Telephone Encounter (Signed)
 ERROR

## 2023-10-22 NOTE — Telephone Encounter (Signed)
 Enbrel  PAP renewal for [employer] printed. Patient has OV on 10/25/23 and can complete then

## 2023-10-25 ENCOUNTER — Ambulatory Visit: Attending: Internal Medicine | Admitting: Internal Medicine

## 2023-10-25 ENCOUNTER — Ambulatory Visit

## 2023-10-25 ENCOUNTER — Encounter: Payer: Self-pay | Admitting: Internal Medicine

## 2023-10-25 VITALS — BP 84/47 | HR 74 | Temp 96.3°F | Resp 16 | Ht 61.0 in | Wt 117.3 lb

## 2023-10-25 DIAGNOSIS — H5461 Unqualified visual loss, right eye, normal vision left eye: Secondary | ICD-10-CM | POA: Diagnosis not present

## 2023-10-25 DIAGNOSIS — Z7952 Long term (current) use of systemic steroids: Secondary | ICD-10-CM

## 2023-10-25 DIAGNOSIS — M069 Rheumatoid arthritis, unspecified: Secondary | ICD-10-CM | POA: Diagnosis not present

## 2023-10-25 DIAGNOSIS — Z79899 Other long term (current) drug therapy: Secondary | ICD-10-CM | POA: Diagnosis not present

## 2023-10-26 LAB — COLOGUARD: COLOGUARD: NEGATIVE

## 2023-10-28 ENCOUNTER — Other Ambulatory Visit: Payer: Self-pay

## 2023-10-28 DIAGNOSIS — M069 Rheumatoid arthritis, unspecified: Secondary | ICD-10-CM

## 2023-10-28 DIAGNOSIS — Z79899 Other long term (current) drug therapy: Secondary | ICD-10-CM

## 2023-10-28 MED ORDER — ENBREL MINI 50 MG/ML ~~LOC~~ SOCT: 50.0000 mg | SUBCUTANEOUS | 0 refills | Status: AC

## 2023-10-28 NOTE — Telephone Encounter (Signed)
 Last Fill: 07/14/2023  Labs: 09/30/2023 CMP WNL  07/14/2023 CBC WNL   TB Gold: 04/14/2023 TB Gold Negative   Next Visit: 01/26/2024  Last Visit: 10/25/2023  DX: Rheumatoid arthritis involving both hands, unspecified whether rheumatoid factor present   Current Dose per office note 10/25/2023: Enbrel  50 mg subcu weekly   Okay to refill Enbrel ?   Patient contacted the office for a refill on her enbrel , advised CBC needed to be updated patient states she was told her labs were up to date at her recent appointment

## 2023-12-01 NOTE — Telephone Encounter (Signed)
 Received notification from [employer] regarding a prior authorization for ENBREL . Authorization has been APPROVED from 12/01/2023 to 01/04/2025. Approval letter sent to scan center.  Authorization # [id number]  Submitted Patient Assistance RENEWAL Application to [employer] for ENBREL  with patient portion, provider portion, insurance card copy, prior authorization approval, and current medication list. Will update patient when we receive a response.  Phone #: [phone] Fax #: [phone]

## 2023-12-01 NOTE — Telephone Encounter (Signed)
 Enbrel  PAP application transcribed to updated forms  Submitted a Prior Authorization renewal request to [employer] for ENBREL  via CoverMyMeds. Will update once we receive a response.  Key: [id number]    Sherry Pennant, PharmD, MPH, BCPS, CPP Clinical Pharmacist [facility])

## 2023-12-03 ENCOUNTER — Other Ambulatory Visit: Payer: Self-pay | Admitting: Family Medicine

## 2023-12-03 DIAGNOSIS — K219 Gastro-esophageal reflux disease without esophagitis: Secondary | ICD-10-CM

## 2024-01-04 NOTE — Telephone Encounter (Signed)
 Per automated system at [employer], all 2026 applications will be processed in 2026 (determination will be made no sooner than 2026)

## 2024-01-12 NOTE — Progress Notes (Unsigned)
 "  Office Visit Note  Patient: Katie [name]             Date of Birth: [birth year]           MRN: [id number]             PCP: Severa Rock HERO, FNP Referring: Severa Rock HERO, FNP Visit Date: 01/26/2024   Subjective:  No chief complaint on file.   History of Present Illness: Katie [name] is a 64 y.o. female here for follow up for seronegative RA on Enbrel  50 mg subcu weekly and prednisone  5 mg daily.    Previous HPI 10/25/2023 Katie [name] is a 64 y.o. female here for follow up for seronegative RA on Enbrel  50 mg subcu weekly and prednisone  5 mg daily.    She continues to manage her rheumatoid arthritis with Enbrel  and low-dose prednisone , with no major changes since her last visit. She experienced a flare-up recently, for which she took a six-day course of medication that helped alleviate the symptoms. Despite ongoing pain in her hips, hands, and plantar fasciitis, she maintains her daily activities, including mowing, weed eating, and leaf blowing, although these activities may be slightly slowed by her symptoms.   She has consulted three different eye doctors since her last visit. The first doctor noted her eye pressure was zero and did not recommend glaucoma drops. The second doctor found her eye pressure to be high and prescribed drops. The third doctor identified significant cataracts in her right eye, which are obscuring her vision. The cataract is so mature that it prevents visualization of the eye's condition behind it. She has an appointment scheduled for November 4th to further address this issue. The cataract significantly impacts her daily life, as she experiences difficulty seeing, bumping into people, and not recognizing when someone is trying to shake her hand.   No recent viral illnesses or antibiotic use. Recent lab work, done during a Medicare physical, showed normal blood count, kidney, and liver function tests, but high cholesterol, which she attributes to recent  increased ice cream consumption. She has been taking red yeast to manage her cholesterol.         Previous HPI 07/14/2023 Katie [name] is a 64 y.o. female here for follow up for seronegative RA on Enbrel  50 mg subcu weekly and prednisone  5 mg daily.     She experiences joint pain and stiffness, particularly in her hands, which become stiff and sore, especially after physical activity or extensive use. One specific area in her hand remains consistently sore at the right 3rd MCP joint. She continues to take Enbrel  and 5 mg of prednisone  consistently without issues. Morning stiffness lasting for several minutes.   She reports persistent tiredness and sleep disturbances, attributing some of her sleep issues to her dogs and her husband's condition, as he has Parkinson's disease and experiences disruptive sleep behaviors.   She mentions long-standing hip pain, describing soreness in her hips, particularly after sitting with her legs crossed for prolonged time due to her dogs' positioning. No new or acute soreness in other areas.        Previous HPI 04/14/2023 Katie [name] is a 64 y.o. female here for follow up for seronegative RA on Enbrel  50 mg subcu weekly and prednisone  5 mg daily.  Overall symptoms have been doing well with no severe flareups or exacerbation.  Currently she is off the prednisone  for the past week and is noticing increased symptoms within a  few days of stopping the medication.  Currently issues increased pain in the right hand, both elbows, bottoms of both feet.  Morning stiffness less than an hour in duration and not seeing visible joint swelling.  Also has 1 unrelated complaint today currently with right eye irritation started after she thinks some debris or allergen exposure against the eye when leaf blowing.     Previous HPI 01/13/2023 Katie [name] is a 64 y.o. female here for follow up for seronegative RA on Enbrel  50 mg subcu weekly and prednisone  5 mg daily.   She presents with persistent hand pain, particularly in the right hand. They report using various home remedies to manage the pain, including paraffin wax, heated gloves, and arthritis compression gloves, which provide some relief.  She worked with occupational therapy with some improvement in grip strength and function although hand pain remained about the same. The patient also notes shoulder pain, which they attribute to recent physical activity rather than their chronic condition.   In addition to their arthritis, the patient has a persistent rash in their waistline area that has been present since the summer. Despite following their doctor's advice for care, the rash continues to cause discomfort and shows signs of necrosis. The patient plans to see a dermatologist once their new insurance coverage begins.   The patient also has a history of trigger finger in one of their fingers, which is now notably crooked and sore to touch. They report that this finger always hurts the most. Despite the pain, the patient remains active, doing chair yoga and other stretches to maintain mobility. They note that their symptoms are tolerable and do not seem to be worsening.    Previous HPI 10/12/2022 Katie [name] is a 64 y.o. female here for follow up for seronegative RA on Enbrel  50 mg subcu weekly and prednisone  5 mg daily.  He started Enbrel  weekly injections first dose on July 7 initially had rash and soreness around the initial injection sites.  Subsequently treated with oral antihistamines and this particular side effect has decreased.  However she still concerned about ongoing pain and stiffness in her hands worse around the third digit typically worse after physical activity and she does stay busy cleaning several homes.  Not sure whether the Enbrel  has made a big improvement in hand symptoms on top of the maintenance prednisone .  Notices benefit with heat and compression.  The rest of her joints are all  doing well.   Previous HPI 04/23/2022 Katie [name] is a 64 y.o. female here for follow up for seronegative RA on prednisone  5 mg daily. Overall symptoms are doing well with this dose but still has some breakthrough symptoms with bilateral shoulder pain and some swelling in finger joints. She has noticed ongoing intermittent palpitation symptoms again despite remaining off Humira . Cardiac evaluation for this has been unremarkable so far no evidence of serious arrhythmia.    Previous HPI 10/10/21 Katie [name] is a 64 y.o. female here for follow up for seronegative RA on prednisone  5 mg daily. She stopped Humira  after last visit due to developing palpitations and pressure in her chest and neck after taking the medication. Since stopping it these symptoms completely went away.  She never recalls any similar symptoms in the past and has not had any with the prednisone .   Previous HPI 05/26/2021 Katie [name] is a 64 y.o. female here for follow up for seronegative RA on prednisone  5 mg daily. Humira  new  start 04/10/2021.  Symptoms slightly improved with the medication so far still has soreness in right hand MCPs main affected area. After about 3 weeks since starting the medication she has noticed an increase in symptoms she feels are similar to palpitations or a panic attack and like there is pressure over the upper part of her chest.  These come and go within a few minutes but have been occurring pretty much daily for weeks.  She has used her husband's A-fib monitor to check and not seen any obvious rhythm abnormality testing at home.  She tried taking hydroxyzine  as needed as previously prescribed for her did not notice any symptom improvement just sedation.   Previous HPI 03/19/2021 Katie [name] is a 64 y.o. female here for follow up for seronegative RA on prednisone  5 mg daily. Labs were checked at initial visit planning for trying biologic DMARD due to intolerance of oral medications. She  has increased joint pain in multiple areas since stopping the low dose prednisone  weeks ago. Worst in neck and shoulders and in right hand. Also started having a faint skin rash around her neck and collar area in the past week.   Previous HPI 02/26/21 Katie [name] is a 64 y.o. female here for seronegative RA. She saw Dr. Everlean in 2017-2018 tried a few medications without much success but had good response to low dose prednisone . She did not tolerate methotrexate or leflunomide with liver function changes and on methotrexate also had significant hair loss. She saw Dr. Mai once for this problem but did not follow up for any long term treatment. She is currently on prednisone  5 mg daily which is controlling symptoms mostly but has a lot of pain any time she comes off this. She has noticed some increase in forward bending of neck and also has a chronic enlarged right supraclavicular lymph node for years.  Bone densitometry from 2015 showing osteopenia t-score -1.4.   DMARD Hx Enbrel - Not effective Humira - Palpitations? MTX- LFT changes, alopecia LEF- LFT changes   No Rheumatology ROS completed.   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 03/19/2021   High risk medication use 02/26/2021   Decreased libido 06/02/2018   Depression, recurrent 05/27/2018   GAD (generalized anxiety disorder) 05/27/2018   Gastroesophageal reflux disease without esophagitis 05/27/2018   RA (rheumatoid arthritis) (HCC) 05/27/2016    Past Medical History:  Diagnosis Date   Arthritis    RA   Asthma    no attack since childhood   Blood transfusion    Contact lens/glasses fitting    wears glasses or contacts   Cystocele    Depression    External hemorrhoids    Fibromyalgia    GERD (gastroesophageal reflux disease)    past hx    Glaucoma    RIGHT EYE   HPV (human papilloma virus) infection    Neuromuscular  disorder (HCC)    fibromyalgia   Plantar fasciitis of right foot 10/2023   Syncope 2015   Pt questioned seizure with this episode - nothing like since - vaso vagal response    Wears dentures    top    Family History  Problem Relation Age of Onset   Hypertension Mother    Hypothyroidism Mother    Rheum arthritis Mother    Emphysema Father    Kidney disease Sister    Hypertension Sister  Bone cancer Daughter        Died at age 95   Celiac disease Daughter    Hypertension Daughter    Colon cancer Neg Hx    Colon polyps Neg Hx    Past Surgical History:  Procedure Laterality Date   ABDOMINAL EXPLORATION SURGERY  1984   bleed after hyst   APPENDECTOMY  1983   BLADDER SUSPENSION  2010   BREAST ENHANCEMENT SURGERY  2000   CARPAL TUNNEL RELEASE  1995   rt   CARPAL TUNNEL RELEASE Left 12/09/2012   Procedure: LEFT LIMITED OPEN CARPAL TUNNEL RELEASE;  Surgeon: Elsie Mussel, MD;  Location: [facility];  Service: Orthopedics;  Laterality: Left;   COLONOSCOPY     CYSTOSCOPY     DIAGNOSTIC LAPAROSCOPY     Medial Branch Block  02/12/2022   Medial Branch Block  03/12/2022   MINI SHUNT INSERTION  10/14/2011   Procedure: INSERTION OF MINI SHUNT;  Surgeon: Gaither Quan, MD;  Location: [facility];  Service: Ophthalmology;  Laterality: Right;   POLYPECTOMY     Tubaligation  1980   VAGINAL HYSTERECTOMY  1983   Social History   Social History Narrative   Not on file   Immunization History  Administered Date(s) Administered   INFLUENZA, HIGH DOSE SEASONAL PF 09/30/2023   Influenza, Seasonal, Injecte, Preservative Fre 10/22/2015   Influenza,inj,Quad PF,6+ Mos 11/07/2018, 11/08/2020   Influenza-Unspecified 11/07/2018, 11/08/2020   Moderna Sars-Covid-2 Vaccination 05/29/2019   PNEUMOCOCCAL CONJUGATE-20 09/30/2023   Pneumococcal Conjugate-13 11/10/2018   Td 02/21/2018   Tdap 01/07/2012, 02/21/2018   Zoster Recombinant(Shingrix) 02/11/2021, 05/15/2021      Objective: Vital Signs: There were no vitals taken for this visit.   Physical Exam   Musculoskeletal Exam: ***  CDAI Exam: CDAI Score: -- Patient Global: --; Provider Global: -- Swollen: --; Tender: -- Joint Exam 01/26/2024   No joint exam has been documented for this visit   There is currently no information documented on the homunculus. Go to the Rheumatology activity and complete the homunculus joint exam.  Investigation: No additional findings.  Imaging: No results found.  Recent Labs: Lab Results  Component Value Date   WBC 5.5 09/30/2023   HGB 13.0 09/30/2023   PLT 173 09/30/2023   NA 140 09/30/2023   K 3.8 09/30/2023   CL 104 09/30/2023   CO2 23 09/30/2023   GLUCOSE 84 09/30/2023   BUN 12 09/30/2023   CREATININE 0.85 09/30/2023   BILITOT 0.6 09/30/2023   ALKPHOS 58 09/30/2023   AST 20 09/30/2023   ALT 15 09/30/2023   PROT 6.5 09/30/2023   ALBUMIN 4.2 09/30/2023   CALCIUM 9.6 09/30/2023   GFRAA 89 05/27/2018   QFTBGOLDPLUS NEGATIVE 04/14/2023    Speciality Comments: MTX stopped due to elevated LFTs/hairloss; leflunomide stopped due to elevated LFTs, Humira  started 04/10/21  Procedures:  No procedures performed Allergies: Codeine   Assessment / Plan:     Visit Diagnoses: No diagnosis found.  ***  Orders: No orders of the defined types were placed in this encounter.  No orders of the defined types were placed in this encounter.    Follow-Up Instructions: No follow-ups on file.   Emogene Muratalla M Ritchie Klee, CMA  Note - This record has been created using Animal nutritionist.  Chart creation errors have been sought, but may not always  have been located. Such creation errors do not reflect on  the standard of medical care. "

## 2024-01-26 ENCOUNTER — Ambulatory Visit: Admitting: Internal Medicine

## 2024-01-26 DIAGNOSIS — Z7952 Long term (current) use of systemic steroids: Secondary | ICD-10-CM

## 2024-01-26 DIAGNOSIS — M069 Rheumatoid arthritis, unspecified: Secondary | ICD-10-CM

## 2024-01-26 DIAGNOSIS — Z79899 Other long term (current) drug therapy: Secondary | ICD-10-CM

## 2024-02-07 NOTE — Telephone Encounter (Signed)
 Per [email or web address]/status, [employer] is requiring proof of income  Called patient - she will bring tax return to upcoming appt on 02/16/2024 with Dr. Jeannetta Sherry Pennant, PharmD, MPH, BCPS, CPP Clinical Pharmacist

## 2024-02-09 NOTE — Progress Notes (Unsigned)
 "  Office Visit Note  Patient: Katie [name]             Date of Birth: 01-02-1961           MRN: [id number]             PCP: Severa Rock HERO, FNP Referring: Severa Rock HERO, FNP Visit Date: 02/16/2024   Subjective:  No chief complaint on file.   History of Present Illness: Katie [name] is a 64 y.o. female here for follow up for seronegative RA on Enbrel  50 mg subcu weekly and prednisone  5 mg daily.    Previous HPI Katie [name] is a 64 y.o. female here for follow up for seronegative RA on Enbrel  50 mg subcu weekly and prednisone  5 mg daily.    She continues to manage her rheumatoid arthritis with Enbrel  and low-dose prednisone , with no major changes since her last visit. She experienced a flare-up recently, for which she took a six-day course of medication that helped alleviate the symptoms. Despite ongoing pain in her hips, hands, and plantar fasciitis, she maintains her daily activities, including mowing, weed eating, and leaf blowing, although these activities may be slightly slowed by her symptoms.   She has consulted three different eye doctors since her last visit. The first doctor noted her eye pressure was zero and did not recommend glaucoma drops. The second doctor found her eye pressure to be high and prescribed drops. The third doctor identified significant cataracts in her right eye, which are obscuring her vision. The cataract is so mature that it prevents visualization of the eye's condition behind it. She has an appointment scheduled for November 4th to further address this issue. The cataract significantly impacts her daily life, as she experiences difficulty seeing, bumping into people, and not recognizing when someone is trying to shake her hand.   No recent viral illnesses or antibiotic use. Recent lab work, done during a Medicare physical, showed normal blood count, kidney, and liver function tests, but high cholesterol, which she attributes to recent increased  ice cream consumption. She has been taking red yeast to manage her cholesterol.         Previous HPI 07/14/2023 Katie [name] is a 64 y.o. female here for follow up for seronegative RA on Enbrel  50 mg subcu weekly and prednisone  5 mg daily.     She experiences joint pain and stiffness, particularly in her hands, which become stiff and sore, especially after physical activity or extensive use. One specific area in her hand remains consistently sore at the right 3rd MCP joint. She continues to take Enbrel  and 5 mg of prednisone  consistently without issues. Morning stiffness lasting for several minutes.   She reports persistent tiredness and sleep disturbances, attributing some of her sleep issues to her dogs and her husband's condition, as he has Parkinson's disease and experiences disruptive sleep behaviors.   She mentions long-standing hip pain, describing soreness in her hips, particularly after sitting with her legs crossed for prolonged time due to her dogs' positioning. No new or acute soreness in other areas.        Previous HPI 04/14/2023 Katie [name] is a 64 y.o. female here for follow up for seronegative RA on Enbrel  50 mg subcu weekly and prednisone  5 mg daily.  Overall symptoms have been doing well with no severe flareups or exacerbation.  Currently she is off the prednisone  for the past week and is noticing increased symptoms within a few  days of stopping the medication.  Currently issues increased pain in the right hand, both elbows, bottoms of both feet.  Morning stiffness less than an hour in duration and not seeing visible joint swelling.  Also has 1 unrelated complaint today currently with right eye irritation started after she thinks some debris or allergen exposure against the eye when leaf blowing.     Previous HPI 01/13/2023 Katie [name] is a 64 y.o. female here for follow up for seronegative RA on Enbrel  50 mg subcu weekly and prednisone  5 mg daily.  She  presents with persistent hand pain, particularly in the right hand. They report using various home remedies to manage the pain, including paraffin wax, heated gloves, and arthritis compression gloves, which provide some relief.  She worked with occupational therapy with some improvement in grip strength and function although hand pain remained about the same. The patient also notes shoulder pain, which they attribute to recent physical activity rather than their chronic condition.   In addition to their arthritis, the patient has a persistent rash in their waistline area that has been present since the summer. Despite following their doctor's advice for care, the rash continues to cause discomfort and shows signs of necrosis. The patient plans to see a dermatologist once their new insurance coverage begins.   The patient also has a history of trigger finger in one of their fingers, which is now notably crooked and sore to touch. They report that this finger always hurts the most. Despite the pain, the patient remains active, doing chair yoga and other stretches to maintain mobility. They note that their symptoms are tolerable and do not seem to be worsening.    Previous HPI 10/12/2022 Katie [name] is a 64 y.o. female here for follow up for seronegative RA on Enbrel  50 mg subcu weekly and prednisone  5 mg daily.  He started Enbrel  weekly injections first dose on July 7 initially had rash and soreness around the initial injection sites.  Subsequently treated with oral antihistamines and this particular side effect has decreased.  However she still concerned about ongoing pain and stiffness in her hands worse around the third digit typically worse after physical activity and she does stay busy cleaning several homes.  Not sure whether the Enbrel  has made a big improvement in hand symptoms on top of the maintenance prednisone .  Notices benefit with heat and compression.  The rest of her joints are all doing  well.   Previous HPI 04/23/2022 Katie [name] is a 64 y.o. female here for follow up for seronegative RA on prednisone  5 mg daily. Overall symptoms are doing well with this dose but still has some breakthrough symptoms with bilateral shoulder pain and some swelling in finger joints. She has noticed ongoing intermittent palpitation symptoms again despite remaining off Humira . Cardiac evaluation for this has been unremarkable so far no evidence of serious arrhythmia.    Previous HPI 10/10/21 Katie [name] is a 64 y.o. female here for follow up for seronegative RA on prednisone  5 mg daily. She stopped Humira  after last visit due to developing palpitations and pressure in her chest and neck after taking the medication. Since stopping it these symptoms completely went away.  She never recalls any similar symptoms in the past and has not had any with the prednisone .   Previous HPI 05/26/2021 Katie [name] is a 64 y.o. female here for follow up for seronegative RA on prednisone  5 mg daily. Humira  new start  04/10/2021.  Symptoms slightly improved with the medication so far still has soreness in right hand MCPs main affected area. After about 3 weeks since starting the medication she has noticed an increase in symptoms she feels are similar to palpitations or a panic attack and like there is pressure over the upper part of her chest.  These come and go within a few minutes but have been occurring pretty much daily for weeks.  She has used her husband's A-fib monitor to check and not seen any obvious rhythm abnormality testing at home.  She tried taking hydroxyzine  as needed as previously prescribed for her did not notice any symptom improvement just sedation.   Previous HPI 03/19/2021 Katie [name] is a 64 y.o. female here for follow up for seronegative RA on prednisone  5 mg daily. Labs were checked at initial visit planning for trying biologic DMARD due to intolerance of oral medications. She has  increased joint pain in multiple areas since stopping the low dose prednisone  weeks ago. Worst in neck and shoulders and in right hand. Also started having a faint skin rash around her neck and collar area in the past week.   Previous HPI 02/26/21 Katie [name] is a 64 y.o. female here for seronegative RA. She saw Dr. Everlean in 2017-2018 tried a few medications without much success but had good response to low dose prednisone . She did not tolerate methotrexate or leflunomide with liver function changes and on methotrexate also had significant hair loss. She saw Dr. Mai once for this problem but did not follow up for any long term treatment. She is currently on prednisone  5 mg daily which is controlling symptoms mostly but has a lot of pain any time she comes off this. She has noticed some increase in forward bending of neck and also has a chronic enlarged right supraclavicular lymph node for years.  Bone densitometry from 2015 showing osteopenia t-score -1.4.   DMARD Hx Enbrel - Not effective Humira - Palpitations? MTX- LFT changes, alopecia LEF- LFT changes   No Rheumatology ROS completed.   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 03/19/2021   High risk medication use 02/26/2021   Decreased libido 06/02/2018   Depression, recurrent 05/27/2018   GAD (generalized anxiety disorder) 05/27/2018   Gastroesophageal reflux disease without esophagitis 05/27/2018   RA (rheumatoid arthritis) (HCC) 05/27/2016    Past Medical History:  Diagnosis Date   Arthritis    RA   Asthma    no attack since childhood   Blood transfusion    Contact lens/glasses fitting    wears glasses or contacts   Cystocele    Depression    External hemorrhoids    Fibromyalgia    GERD (gastroesophageal reflux disease)    past hx    Glaucoma    RIGHT EYE   HPV (human papilloma virus) infection    Neuromuscular  disorder (HCC)    fibromyalgia   Plantar fasciitis of right foot 10/2023   Syncope 2015   Pt questioned seizure with this episode - nothing like since - vaso vagal response    Wears dentures    top    Family History  Problem Relation Age of Onset   Hypertension Mother    Hypothyroidism Mother    Rheum arthritis Mother    Emphysema Father    Kidney disease Sister    Hypertension Sister    Bone  cancer Daughter        Died at age 70   Celiac disease Daughter    Hypertension Daughter    Colon cancer Neg Hx    Colon polyps Neg Hx    Past Surgical History:  Procedure Laterality Date   ABDOMINAL EXPLORATION SURGERY  1984   bleed after hyst   APPENDECTOMY  1983   BLADDER SUSPENSION  2010   BREAST ENHANCEMENT SURGERY  2000   CARPAL TUNNEL RELEASE  1995   rt   CARPAL TUNNEL RELEASE Left 12/09/2012   Procedure: LEFT LIMITED OPEN CARPAL TUNNEL RELEASE;  Surgeon: Elsie Mussel, MD;  Location: [facility];  Service: Orthopedics;  Laterality: Left;   COLONOSCOPY     CYSTOSCOPY     DIAGNOSTIC LAPAROSCOPY     Medial Branch Block  02/12/2022   Medial Branch Block  03/12/2022   MINI SHUNT INSERTION  10/14/2011   Procedure: INSERTION OF MINI SHUNT;  Surgeon: Gaither Quan, MD;  Location: [facility];  Service: Ophthalmology;  Laterality: Right;   POLYPECTOMY     Tubaligation  1980   VAGINAL HYSTERECTOMY  1983   Social History   Social History Narrative   Not on file   Immunization History  Administered Date(s) Administered   INFLUENZA, HIGH DOSE SEASONAL PF 09/30/2023   Influenza, Seasonal, Injecte, Preservative Fre 10/22/2015   Influenza,inj,Quad PF,6+ Mos 11/07/2018, 11/08/2020   Influenza-Unspecified 11/07/2018, 11/08/2020   Moderna Sars-Covid-2 Vaccination 05/29/2019   PNEUMOCOCCAL CONJUGATE-20 09/30/2023   Pneumococcal Conjugate-13 11/10/2018   Td 02/21/2018   Tdap 01/07/2012, 02/21/2018   Zoster Recombinant(Shingrix) 02/11/2021, 05/15/2021      Objective: Vital Signs: There were no vitals taken for this visit.   Physical Exam   Musculoskeletal Exam: ***   Investigation: No additional findings.  Imaging: No results found.  Recent Labs: Lab Results  Component Value Date   WBC 5.5 09/30/2023   HGB 13.0 09/30/2023   PLT 173 09/30/2023   NA 140 09/30/2023   K 3.8 09/30/2023   CL 104 09/30/2023   CO2 23 09/30/2023   GLUCOSE 84 09/30/2023   BUN 12 09/30/2023   CREATININE 0.85 09/30/2023   BILITOT 0.6 09/30/2023   ALKPHOS 58 09/30/2023   AST 20 09/30/2023   ALT 15 09/30/2023   PROT 6.5 09/30/2023   ALBUMIN 4.2 09/30/2023   CALCIUM 9.6 09/30/2023   GFRAA 89 05/27/2018   QFTBGOLDPLUS NEGATIVE 04/14/2023    Speciality Comments: MTX stopped due to elevated LFTs/hairloss; leflunomide stopped due to elevated LFTs, Humira  started 04/10/21  Procedures:  No procedures performed Allergies: Codeine   Assessment / Plan:     Visit Diagnoses:  Assessment & Plan Rheumatoid arthritis involving both hands, unspecified whether rheumatoid factor present (HCC)     High risk medication use     Long term (current) use of systemic steroids      ***  Follow-Up Instructions: No follow-ups on file.   Marie Chow M Taquila Leys, CMA  Note - This record has been created using Animal nutritionist.  Chart creation errors have been sought, but may not always  have been located. Such creation errors do not reflect on  the standard of medical care. "

## 2024-02-09 NOTE — Assessment & Plan Note (Signed)
 SABRA

## 2024-02-09 NOTE — Assessment & Plan Note (Signed)
 Katie [name]

## 2024-02-16 ENCOUNTER — Ambulatory Visit: Admitting: Internal Medicine

## 2024-02-16 DIAGNOSIS — M069 Rheumatoid arthritis, unspecified: Secondary | ICD-10-CM

## 2024-02-16 DIAGNOSIS — Z79899 Other long term (current) drug therapy: Secondary | ICD-10-CM

## 2024-02-16 DIAGNOSIS — Z7952 Long term (current) use of systemic steroids: Secondary | ICD-10-CM
